# Patient Record
Sex: Female | Born: 1935
Health system: Southern US, Community
[De-identification: ages and names within clinical notes are randomized; demographics above are authoritative.]

## PROBLEM LIST (undated history)

## (undated) DIAGNOSIS — E7800 Pure hypercholesterolemia, unspecified: Secondary | ICD-10-CM

## (undated) DIAGNOSIS — I10 Essential (primary) hypertension: Secondary | ICD-10-CM

## (undated) DIAGNOSIS — R519 Headache, unspecified: Secondary | ICD-10-CM

## (undated) DIAGNOSIS — N189 Chronic kidney disease, unspecified: Secondary | ICD-10-CM

## (undated) DIAGNOSIS — G8929 Other chronic pain: Secondary | ICD-10-CM

## (undated) DIAGNOSIS — R51 Headache: Secondary | ICD-10-CM

## (undated) HISTORY — DX: Other chronic pain: G89.29

## (undated) HISTORY — DX: Essential (primary) hypertension: I10

## (undated) HISTORY — DX: Pure hypercholesterolemia, unspecified: E78.00

## (undated) HISTORY — DX: Headache: R51

## (undated) HISTORY — DX: Headache, unspecified: R51.9

## (undated) HISTORY — DX: Chronic kidney disease, unspecified: N18.9

## (undated) HISTORY — PX: VESICOVAGINAL FISTULA CLOSURE W/ TAH: SUR271

---

## 1997-11-05 ENCOUNTER — Other Ambulatory Visit: Admission: RE | Admit: 1997-11-05 | Discharge: 1997-11-05 | Payer: Self-pay | Admitting: Family Medicine

## 2002-05-26 ENCOUNTER — Ambulatory Visit (HOSPITAL_COMMUNITY): Admission: RE | Admit: 2002-05-26 | Discharge: 2002-05-26 | Payer: Self-pay | Admitting: Gastroenterology

## 2003-08-16 ENCOUNTER — Other Ambulatory Visit: Admission: RE | Admit: 2003-08-16 | Discharge: 2003-08-16 | Payer: Self-pay | Admitting: Obstetrics and Gynecology

## 2011-07-12 DIAGNOSIS — H10409 Unspecified chronic conjunctivitis, unspecified eye: Secondary | ICD-10-CM | POA: Diagnosis not present

## 2011-07-12 DIAGNOSIS — Z961 Presence of intraocular lens: Secondary | ICD-10-CM | POA: Diagnosis not present

## 2011-07-12 DIAGNOSIS — H04129 Dry eye syndrome of unspecified lacrimal gland: Secondary | ICD-10-CM | POA: Diagnosis not present

## 2011-07-12 DIAGNOSIS — H31009 Unspecified chorioretinal scars, unspecified eye: Secondary | ICD-10-CM | POA: Diagnosis not present

## 2011-08-22 DIAGNOSIS — L570 Actinic keratosis: Secondary | ICD-10-CM | POA: Diagnosis not present

## 2011-08-22 DIAGNOSIS — D485 Neoplasm of uncertain behavior of skin: Secondary | ICD-10-CM | POA: Diagnosis not present

## 2011-09-01 DIAGNOSIS — A088 Other specified intestinal infections: Secondary | ICD-10-CM | POA: Diagnosis not present

## 2011-09-04 DIAGNOSIS — A088 Other specified intestinal infections: Secondary | ICD-10-CM | POA: Diagnosis not present

## 2011-11-02 DIAGNOSIS — Z0000 Encounter for general adult medical examination without abnormal findings: Secondary | ICD-10-CM | POA: Diagnosis not present

## 2011-11-02 DIAGNOSIS — E785 Hyperlipidemia, unspecified: Secondary | ICD-10-CM | POA: Diagnosis not present

## 2011-11-02 DIAGNOSIS — M810 Age-related osteoporosis without current pathological fracture: Secondary | ICD-10-CM | POA: Diagnosis not present

## 2011-11-02 DIAGNOSIS — Z79899 Other long term (current) drug therapy: Secondary | ICD-10-CM | POA: Diagnosis not present

## 2011-11-02 DIAGNOSIS — I10 Essential (primary) hypertension: Secondary | ICD-10-CM | POA: Diagnosis not present

## 2011-11-10 DIAGNOSIS — R6889 Other general symptoms and signs: Secondary | ICD-10-CM | POA: Diagnosis not present

## 2011-11-10 DIAGNOSIS — Z1211 Encounter for screening for malignant neoplasm of colon: Secondary | ICD-10-CM | POA: Diagnosis not present

## 2011-11-10 DIAGNOSIS — Z79899 Other long term (current) drug therapy: Secondary | ICD-10-CM | POA: Diagnosis not present

## 2011-12-11 DIAGNOSIS — Z23 Encounter for immunization: Secondary | ICD-10-CM | POA: Diagnosis not present

## 2011-12-11 DIAGNOSIS — N390 Urinary tract infection, site not specified: Secondary | ICD-10-CM | POA: Diagnosis not present

## 2011-12-11 DIAGNOSIS — R350 Frequency of micturition: Secondary | ICD-10-CM | POA: Diagnosis not present

## 2011-12-12 DIAGNOSIS — D649 Anemia, unspecified: Secondary | ICD-10-CM | POA: Diagnosis not present

## 2011-12-22 DIAGNOSIS — K219 Gastro-esophageal reflux disease without esophagitis: Secondary | ICD-10-CM | POA: Diagnosis not present

## 2011-12-22 DIAGNOSIS — R350 Frequency of micturition: Secondary | ICD-10-CM | POA: Diagnosis not present

## 2011-12-22 DIAGNOSIS — N390 Urinary tract infection, site not specified: Secondary | ICD-10-CM | POA: Diagnosis not present

## 2012-01-08 DIAGNOSIS — R3129 Other microscopic hematuria: Secondary | ICD-10-CM | POA: Diagnosis not present

## 2012-02-16 DIAGNOSIS — N390 Urinary tract infection, site not specified: Secondary | ICD-10-CM | POA: Diagnosis not present

## 2012-07-24 DIAGNOSIS — N390 Urinary tract infection, site not specified: Secondary | ICD-10-CM | POA: Diagnosis not present

## 2012-07-24 DIAGNOSIS — R300 Dysuria: Secondary | ICD-10-CM | POA: Diagnosis not present

## 2012-07-24 DIAGNOSIS — I10 Essential (primary) hypertension: Secondary | ICD-10-CM | POA: Diagnosis not present

## 2012-07-24 DIAGNOSIS — M76899 Other specified enthesopathies of unspecified lower limb, excluding foot: Secondary | ICD-10-CM | POA: Diagnosis not present

## 2012-08-02 DIAGNOSIS — I10 Essential (primary) hypertension: Secondary | ICD-10-CM | POA: Diagnosis not present

## 2012-08-02 DIAGNOSIS — N8111 Cystocele, midline: Secondary | ICD-10-CM | POA: Diagnosis not present

## 2012-08-02 DIAGNOSIS — D126 Benign neoplasm of colon, unspecified: Secondary | ICD-10-CM | POA: Diagnosis not present

## 2012-08-28 DIAGNOSIS — Z1231 Encounter for screening mammogram for malignant neoplasm of breast: Secondary | ICD-10-CM | POA: Diagnosis not present

## 2012-08-28 DIAGNOSIS — Z13820 Encounter for screening for osteoporosis: Secondary | ICD-10-CM | POA: Diagnosis not present

## 2012-08-28 DIAGNOSIS — Z780 Asymptomatic menopausal state: Secondary | ICD-10-CM | POA: Diagnosis not present

## 2012-09-17 DIAGNOSIS — K625 Hemorrhage of anus and rectum: Secondary | ICD-10-CM | POA: Diagnosis not present

## 2012-09-17 DIAGNOSIS — Z1211 Encounter for screening for malignant neoplasm of colon: Secondary | ICD-10-CM | POA: Diagnosis not present

## 2012-09-17 DIAGNOSIS — K648 Other hemorrhoids: Secondary | ICD-10-CM | POA: Diagnosis not present

## 2012-09-17 DIAGNOSIS — K5730 Diverticulosis of large intestine without perforation or abscess without bleeding: Secondary | ICD-10-CM | POA: Diagnosis not present

## 2012-10-23 DIAGNOSIS — Z1211 Encounter for screening for malignant neoplasm of colon: Secondary | ICD-10-CM | POA: Diagnosis not present

## 2012-10-23 DIAGNOSIS — K5730 Diverticulosis of large intestine without perforation or abscess without bleeding: Secondary | ICD-10-CM | POA: Diagnosis not present

## 2012-10-23 DIAGNOSIS — D126 Benign neoplasm of colon, unspecified: Secondary | ICD-10-CM | POA: Diagnosis not present

## 2012-10-23 DIAGNOSIS — D128 Benign neoplasm of rectum: Secondary | ICD-10-CM | POA: Diagnosis not present

## 2012-10-23 DIAGNOSIS — K625 Hemorrhage of anus and rectum: Secondary | ICD-10-CM | POA: Diagnosis not present

## 2012-11-11 DIAGNOSIS — D649 Anemia, unspecified: Secondary | ICD-10-CM | POA: Diagnosis not present

## 2012-11-11 DIAGNOSIS — I10 Essential (primary) hypertension: Secondary | ICD-10-CM | POA: Diagnosis not present

## 2012-11-11 DIAGNOSIS — G479 Sleep disorder, unspecified: Secondary | ICD-10-CM | POA: Diagnosis not present

## 2012-11-11 DIAGNOSIS — Z79899 Other long term (current) drug therapy: Secondary | ICD-10-CM | POA: Diagnosis not present

## 2012-11-11 DIAGNOSIS — E785 Hyperlipidemia, unspecified: Secondary | ICD-10-CM | POA: Diagnosis not present

## 2012-11-11 DIAGNOSIS — R42 Dizziness and giddiness: Secondary | ICD-10-CM | POA: Diagnosis not present

## 2012-11-11 DIAGNOSIS — R6889 Other general symptoms and signs: Secondary | ICD-10-CM | POA: Diagnosis not present

## 2012-11-11 DIAGNOSIS — M810 Age-related osteoporosis without current pathological fracture: Secondary | ICD-10-CM | POA: Diagnosis not present

## 2012-11-11 DIAGNOSIS — Z0000 Encounter for general adult medical examination without abnormal findings: Secondary | ICD-10-CM | POA: Diagnosis not present

## 2012-11-21 DIAGNOSIS — J069 Acute upper respiratory infection, unspecified: Secondary | ICD-10-CM | POA: Diagnosis not present

## 2012-11-21 DIAGNOSIS — D649 Anemia, unspecified: Secondary | ICD-10-CM | POA: Diagnosis not present

## 2012-12-01 DIAGNOSIS — N390 Urinary tract infection, site not specified: Secondary | ICD-10-CM | POA: Diagnosis not present

## 2012-12-05 DIAGNOSIS — J069 Acute upper respiratory infection, unspecified: Secondary | ICD-10-CM | POA: Diagnosis not present

## 2012-12-10 ENCOUNTER — Other Ambulatory Visit: Payer: Self-pay | Admitting: Family Medicine

## 2012-12-10 ENCOUNTER — Ambulatory Visit
Admission: RE | Admit: 2012-12-10 | Discharge: 2012-12-10 | Disposition: A | Discharge status: Home / Self Care | Payer: Medicare Other | Source: Ambulatory Visit | Attending: Family Medicine | Admitting: Family Medicine

## 2012-12-10 DIAGNOSIS — R05 Cough: Secondary | ICD-10-CM

## 2012-12-10 DIAGNOSIS — M549 Dorsalgia, unspecified: Secondary | ICD-10-CM

## 2012-12-10 DIAGNOSIS — R059 Cough, unspecified: Secondary | ICD-10-CM

## 2012-12-10 DIAGNOSIS — J8410 Pulmonary fibrosis, unspecified: Secondary | ICD-10-CM | POA: Diagnosis not present

## 2012-12-10 DIAGNOSIS — M47814 Spondylosis without myelopathy or radiculopathy, thoracic region: Secondary | ICD-10-CM | POA: Diagnosis not present

## 2012-12-10 DIAGNOSIS — M4120 Other idiopathic scoliosis, site unspecified: Secondary | ICD-10-CM | POA: Diagnosis not present

## 2012-12-30 DIAGNOSIS — Z23 Encounter for immunization: Secondary | ICD-10-CM | POA: Diagnosis not present

## 2013-02-10 DIAGNOSIS — R05 Cough: Secondary | ICD-10-CM | POA: Diagnosis not present

## 2013-02-10 DIAGNOSIS — B356 Tinea cruris: Secondary | ICD-10-CM | POA: Diagnosis not present

## 2013-03-28 DIAGNOSIS — R05 Cough: Secondary | ICD-10-CM | POA: Diagnosis not present

## 2013-03-28 DIAGNOSIS — R059 Cough, unspecified: Secondary | ICD-10-CM | POA: Diagnosis not present

## 2013-04-15 DIAGNOSIS — R05 Cough: Secondary | ICD-10-CM | POA: Diagnosis not present

## 2013-04-15 DIAGNOSIS — R059 Cough, unspecified: Secondary | ICD-10-CM | POA: Diagnosis not present

## 2013-05-26 ENCOUNTER — Other Ambulatory Visit (HOSPITAL_COMMUNITY): Payer: Self-pay | Admitting: Internal Medicine

## 2013-05-26 ENCOUNTER — Other Ambulatory Visit: Payer: Medicare Other

## 2013-05-26 ENCOUNTER — Ambulatory Visit (INDEPENDENT_AMBULATORY_CARE_PROVIDER_SITE_OTHER): Payer: Medicare Other | Admitting: Internal Medicine

## 2013-05-26 ENCOUNTER — Encounter: Payer: Self-pay | Admitting: Internal Medicine

## 2013-05-26 VITALS — BP 118/74 | HR 56 | Ht 61.0 in | Wt 142.6 lb

## 2013-05-26 DIAGNOSIS — R1310 Dysphagia, unspecified: Secondary | ICD-10-CM

## 2013-05-26 DIAGNOSIS — R05 Cough: Secondary | ICD-10-CM

## 2013-05-26 DIAGNOSIS — R059 Cough, unspecified: Secondary | ICD-10-CM | POA: Diagnosis not present

## 2013-05-26 DIAGNOSIS — R053 Chronic cough: Secondary | ICD-10-CM

## 2013-05-26 MED ORDER — AZELASTINE-FLUTICASONE 137-50 MCG/ACT NA SUSP: 2.0000 | Freq: Every day | NASAL | Status: DC

## 2013-05-26 MED ORDER — FLUTICASONE FUROATE-VILANTEROL 100-25 MCG/INH IN AEPB: 1.0000 | INHALATION_SPRAY | Freq: Every day | RESPIRATORY_TRACT | Status: DC

## 2013-05-26 NOTE — Patient Instructions (Signed)
Order- lab- Quantiferon TB Gold  Order- schedule Speech Therapy assisted Modified Barium swallow                    Dx Cough with swallowing  Sample Breo Ellipta inhaler   Inhale 1 puff, one time daily  Then rinse mouth  Sample Dymista nasal spray     1-2 puffs in each nostril once daily at bedtime  Pay particular attention to swallowing and reflux, with or without heartburn/ indigestion, to see if that seems important to your cough

## 2013-05-26 NOTE — Progress Notes (Signed)
05/26/13- 87 yoF never smoker seen on kind referral from Dr Stephanie Acre for cough Onset of this cough in late September of 2014, starting as a nasal congestion/head cold. Hard coughing her to mid thoracic spine initially but that resolved. Cough persisted but otherwise the viral syndrome resolved. Used Nasonex. Never felt well. After January, cough got worse again although nasal congestion improved. Tried antibiotics twice. Cough has been productive scant yellow with little wheeze and no blood or purulent. Persistent mild rhinitis. Failed Tessalon Perles. Codeine cough syrup helps. Chest x-ray noted elevation left hemidiaphragm, calcified nodes, left ventricular prominence. She had lived in [place] and [place], potentially exposed to histoplasmosis. GERD controlled with Nexium. Eating and drinking occasionally trigger bad cough. Cough is not worse at night. Remembers being very sick as a child with whooping cough. No known exposure to TB but did have a positive PPD in the past. Married, retired Pharmacist, hospital. CXR 12/10/12 IMPRESSION:  1. No active lung disease.  2. Calcified subcarinal and right hilar lymph nodes consistent with  prior granulomatous disease.  Electronically Signed  By: Ivar Drape M.D.  On: 12/10/2012 12:22  Prior to Admission medications   Medication Sig Start Date End Date Taking? Authorizing Provider  aspirin 81 MG tablet Take 81 mg by mouth daily.   Yes Historical Provider, MD  Calcium-Vitamin D-Vitamin K (VIACTIV PO) Take 2 capsules by mouth daily.   Yes Historical Provider, MD  esomeprazole (NEXIUM) 40 MG capsule Take 40 mg by mouth daily at 12 noon.   Yes Historical Provider, MD  fenofibrate micronized (LOFIBRA) 200 MG capsule Take 200 mg by mouth at bedtime.   Yes Historical Provider, MD  ibuprofen (ADVIL,MOTRIN) 200 MG tablet Take 200 mg by mouth as needed.   Yes Historical Provider, MD  losartan (COZAAR) 50 MG tablet Take 50 mg by mouth daily.   Yes Historical  Provider, MD  Azelastine-Fluticasone (DYMISTA) 137-50 MCG/ACT SUSP Place 1-2 sprays into both nostrils at bedtime. 06/13/13   Deneise Lever, MD  Fluticasone Furoate-Vilanterol (BREO ELLIPTA) 100-25 MCG/INH AEPB Inhale 1 puff into the lungs daily. RINSE MOUTH WELL AFTER USE 06/13/13   Deneise Lever, MD   Past Medical History  Diagnosis Date  . High blood pressure   . High cholesterol   . Chronic headaches     migraines   Past Surgical History  Procedure Laterality Date  . Vesicovaginal fistula closure w/ tah     Family History  Problem Relation Age of Onset  . Heart disease Father   . Heart murmur Sister   . Glaucoma Sister   . Allergies Sister    History   Social History  . Marital Status: Married    Spouse Name: N/A    Number of Children: 2  . Years of Education: N/A   Occupational History  . Retired-teacher    Social History Main Topics  . Smoking status: Never Smoker   . Smokeless tobacco: Not on file  . Alcohol Use: Yes     Comment: occasional wine(red)  . Drug Use: No  . Sexual Activity: Not on file   Other Topics Concern  . Not on file   Social History Narrative  . No narrative on file   ROS-see HPI Constitutional:   No-   weight loss, night sweats, fevers, chills, fatigue, lassitude. HEENT:   No-  headaches, difficulty swallowing, tooth/dental problems, sore throat,       +sneezing, itching, ear ache, +nasal congestion, post nasal drip,  CV:  + chest pain, orthopnea, PND, swelling in lower extremities, anasarca,                                  dizziness, palpitations Resp: No-   shortness of breath with exertion or at rest.              + productive cough,  + non-productive cough,  No- coughing up of blood.              No-   change in color of mucus.  No- wheezing.   Skin: No-   rash or lesions. GI:  N+heartburn, indigestion, no-abdominal pain, nausea, vomiting, diarrhea,                 change in bowel habits, loss of appetite GU: No-   dysuria,  change in color of urine, no urgency or frequency.  No- flank pain. MS:  No-   joint pain or swelling.  No- decreased range of motion.  No- back pain. Neuro-     nothing unusual Psych:  No- change in mood or affect. No depression or anxiety.  No memory loss.  OBJ- Physical Exam  Calm elderly lady General- Alert, Oriented, Affect-appropriate, Distress- none acute Skin- rash-none, lesions- none, excoriation- none Lymphadenopathy- none Head- atraumatic            Eyes- Gross vision intact, PERRLA, conjunctivae and secretions clear            Ears- Hearing, canals-normal            Nose- Clear, no-Septal dev, mucus, polyps, erosion, perforation             Throat- Mallampati II , mucosa clear , drainage- none, tonsils- atrophic Neck- flexible , trachea midline, no stridor , thyroid nl, carotid no bruit Chest - symmetrical excursion , unlabored           Heart/CV- RRR , no murmur , no gallop  , no rub, nl s1 s2                           - JVD- none , edema- none, stasis changes- none, varices- none           Lung- clear to P&A, wheeze- none, cough- none , dullness-none, rub- none           Chest wall-  Abd- tender-no, distended-no, bowel sounds-present, HSM- no Br/ Gen/ Rectal- Not done, not indicated Extrem- cyanosis- none, clubbing, none, atrophy- none, strength- nl Neuro- grossly intact to observation

## 2013-05-28 LAB — QUANTIFERON TB GOLD ASSAY (BLOOD)
INTERFERON GAMMA RELEASE ASSAY: NEGATIVE
Mitogen value: 3.64 IU/mL
QUANTIFERON NIL VALUE: 0.04 [IU]/mL
Quantiferon Tb Ag Minus Nil Value: 0.08 IU/mL
TB Ag value: 0.12 IU/mL

## 2013-05-29 DIAGNOSIS — E038 Other specified hypothyroidism: Secondary | ICD-10-CM | POA: Diagnosis not present

## 2013-05-29 DIAGNOSIS — G479 Sleep disorder, unspecified: Secondary | ICD-10-CM | POA: Diagnosis not present

## 2013-05-29 DIAGNOSIS — E785 Hyperlipidemia, unspecified: Secondary | ICD-10-CM | POA: Diagnosis not present

## 2013-05-29 DIAGNOSIS — I10 Essential (primary) hypertension: Secondary | ICD-10-CM | POA: Diagnosis not present

## 2013-06-02 ENCOUNTER — Ambulatory Visit (HOSPITAL_COMMUNITY)
Admission: RE | Admit: 2013-06-02 | Discharge: 2013-06-02 | Disposition: A | Discharge status: Home / Self Care | Payer: Medicare Other | Source: Ambulatory Visit | Attending: Internal Medicine | Admitting: Internal Medicine

## 2013-06-02 DIAGNOSIS — R053 Chronic cough: Secondary | ICD-10-CM

## 2013-06-02 DIAGNOSIS — R1310 Dysphagia, unspecified: Secondary | ICD-10-CM

## 2013-06-02 DIAGNOSIS — R05 Cough: Secondary | ICD-10-CM

## 2013-06-02 DIAGNOSIS — R059 Cough, unspecified: Secondary | ICD-10-CM | POA: Diagnosis not present

## 2013-06-02 NOTE — Procedures (Signed)
Objective Swallowing Evaluation: Modified Barium Swallowing Study  Patient Details  Name: Susan Marks MRN: [id number] Date of Birth: 10/11/1925  Today's Date: 06/02/2013 Time: 6433-2951 SLP Time Calculation (min): 30 min  Past Medical History:  Past Medical History  Diagnosis Date  . High blood pressure   . High cholesterol   . Chronic headaches     migraines   Past Surgical History:  Past Surgical History  Procedure Laterality Date  . Vesicovaginal fistula closure w/ tah     HPI:  80 yo female referred to SLP for MBS due to pt report of chronic coughing.  Pt PMH + for arthritis of neck, acid reflux, HTN.  Pt reports she has taken a PPI of some form over the last 10 years = Generic prilosec for approx 5 years until 4-5 years ago when medicine was changed to Nexium.  Pt reports she avoids tomato based sauces and vinegar based items as they may cause reflux symptoms. Pt admits to chronic coughing since upper respiratory virus in mid-September.  Pt admits to coughing while on phone today and waking with hoarseness.  She reports occasional issue with coughing (usually while dining out) excessively that she has to leave the table- last event occured mid-meal.         Assessment / Plan / Recommendation Clinical Impression  Dysphagia Diagnosis: Within Functional Limits;Suspected primary esophageal dysphagia Clinical impression: Pt with functional oropharyngeal swallow ability without aspiration/penetration or oropharyngeal stasis of any consistency tested *including thin, pudding, graham cracker and barium tablet taken with thin barium.  Pt noted to clear her throat prior to and during MBS.  Appearance of mildly delayed clearance at distal esophagus without pt sensation, liquid and dry swallows aid clearance-radiologist not present to confirm findings.  Pt educated to findings of test and compensation strategies found to be helpful during MBS eg: consuming liquids and intermittent dry  swallow to facilitate clearance.    Thanks for this referral.       Treatment Recommendation  No treatment recommended at this time    Diet Recommendation Regular;Thin liquid   Liquid Administration via: Straw;Cup Medication Administration: Whole meds with liquid Supervision: Patient able to self feed Compensations: Small sips/bites;Slow rate (drink liquids t/o meal, intermittent dry swallow) Postural Changes and/or Swallow Maneuvers: Seated upright 90 degrees;Upright 30-60 min after meal    Other  Recommendations Oral Care Recommendations: Oral care BID   Follow Up Recommendations  None           SLP Swallow Goals     General Date of Onset: 06/02/13 HPI: 80 yo female referred to SLP for MBS due to pt report of chronic coughing.  Pt PMH + for arthritis of neck, acid reflux, HTN.  Pt reports she has taken a PPI of some form over the last 10 years = Generic prilosec for approx 5 years until 4-5 years ago when medicine was changed to Nexium.  Pt reports she avoids tomato based sauces and vinegar based items as they may cause reflux symptoms. Pt admits to chronic coughing since upper respiratory virus in mid-September.  Pt admits to coughing while on phone today and waking with hoarseness.  She reports occasional issue with coughing (usually while dining out) excessively that she has to leave the table- last event occured mid-meal.     Type of Study: Modified Barium Swallowing Study Reason for Referral: Objectively evaluate swallowing function Diet Prior to this Study: Regular;Thin liquids Temperature Spikes Noted: No Respiratory Status: Room air History  of Recent Intubation: No Behavior/Cognition: Alert;Cooperative;Pleasant mood Oral Cavity - Dentition: Adequate natural dentition Oral Motor / Sensory Function: Within functional limits Self-Feeding Abilities: Able to feed self Patient Positioning: Upright in bed Baseline Vocal Quality: Clear Volitional Cough: Strong Volitional  Swallow: Able to elicit Anatomy: Within functional limits Pharyngeal Secretions: Not observed secondary MBS    Reason for Referral Objectively evaluate swallowing function   Oral Phase Oral Preparation/Oral Phase Oral Phase: WFL   Pharyngeal Phase Pharyngeal Phase Pharyngeal Phase: Within functional limits  Cervical Esophageal Phase    GO    Cervical Esophageal Phase Cervical Esophageal Phase: [facility]    Functional Assessment Tool Used: mbs. clinical judgement Functional Limitations: Swallowing Swallow Current Status (W4097): At least 1 percent but less than 20 percent impaired, limited or restricted Swallow Goal Status [phone]): At least 1 percent but less than 20 percent impaired, limited or restricted Swallow Discharge Status [phone]): At least 1 percent but less than 20 percent impaired, limited or restricted    Luanna Salk, [facility] SLP [phone]

## 2013-06-03 DIAGNOSIS — E038 Other specified hypothyroidism: Secondary | ICD-10-CM | POA: Diagnosis not present

## 2013-06-03 DIAGNOSIS — E785 Hyperlipidemia, unspecified: Secondary | ICD-10-CM | POA: Diagnosis not present

## 2013-06-12 ENCOUNTER — Telehealth: Payer: Self-pay | Admitting: Internal Medicine

## 2013-06-12 NOTE — Telephone Encounter (Signed)
Pt was seen by CY on 05/26/13 with the following instructions:   Patient Instructions     Order- lab- Quantiferon TB Gold  Order- schedule Speech Therapy assisted Modified Barium swallow Dx Cough with swallowing  Sample Breo Ellipta inhaler Inhale 1 puff, one time daily Then rinse mouth  Sample Dymista nasal spray 1-2 puffs in each nostril once daily at bedtime  Pay particular attention to swallowing and reflux, with or without heartburn/ indigestion, to see if that seems important to your cough   -------  lmomtcb for pt to verify msg

## 2013-06-13 DIAGNOSIS — R05 Cough: Secondary | ICD-10-CM | POA: Insufficient documentation

## 2013-06-13 DIAGNOSIS — R059 Cough, unspecified: Secondary | ICD-10-CM | POA: Insufficient documentation

## 2013-06-13 MED ORDER — FLUTICASONE FUROATE-VILANTEROL 100-25 MCG/INH IN AEPB: 1.0000 | INHALATION_SPRAY | Freq: Every day | RESPIRATORY_TRACT | Status: DC

## 2013-06-13 MED ORDER — AZELASTINE-FLUTICASONE 137-50 MCG/ACT NA SUSP: 1.0000 | Freq: Every day | NASAL | Status: DC

## 2013-06-13 NOTE — Telephone Encounter (Signed)
Pt has returned call. °

## 2013-06-13 NOTE — Telephone Encounter (Signed)
Spoke with pt.  Pt reports she is coughing less now with breo and dymista.  She would like rxs sent to [facility].  This has been done.  Pt aware and was asked to pls call office back if she has any problems with picking medications up from pharmacy.  She verbalized understanding and voiced no further questions or concerns at this time.

## 2013-06-13 NOTE — Telephone Encounter (Signed)
LMOMTCB x 1 

## 2013-06-13 NOTE — Assessment & Plan Note (Signed)
It sounds as if this cough began with a viral upper respiratory infection. Can't exclude a residual sinusitis or unusually prolonged postviral bronchitis. Possible cyclical cough related to GERD, once the cough process started. I have rarely seen calcified lymph nodes eroded into an airway, causing cough. Plan-TB assay, modified barium swallow for reflux penetration, sample Breo Ellipta, sample Dymista nasal spray. Consider if a CT scan would be helpful.

## 2013-07-07 ENCOUNTER — Encounter: Payer: Self-pay | Admitting: Internal Medicine

## 2013-07-07 ENCOUNTER — Ambulatory Visit (INDEPENDENT_AMBULATORY_CARE_PROVIDER_SITE_OTHER): Payer: Medicare Other | Admitting: Internal Medicine

## 2013-07-07 VITALS — BP 126/70 | HR 64 | Ht 61.0 in | Wt 145.2 lb

## 2013-07-07 DIAGNOSIS — R059 Cough, unspecified: Secondary | ICD-10-CM

## 2013-07-07 DIAGNOSIS — J984 Other disorders of lung: Secondary | ICD-10-CM

## 2013-07-07 DIAGNOSIS — R05 Cough: Secondary | ICD-10-CM

## 2013-07-07 DIAGNOSIS — J8410 Pulmonary fibrosis, unspecified: Secondary | ICD-10-CM | POA: Insufficient documentation

## 2013-07-07 DIAGNOSIS — K219 Gastro-esophageal reflux disease without esophagitis: Secondary | ICD-10-CM

## 2013-07-07 NOTE — Progress Notes (Signed)
05/26/13- 87 yoF never smoker seen on kind referral from Dr Stephanie Acre for cough Onset of this cough in late September of 2014, starting as a nasal congestion/head cold. Hard coughing her to mid thoracic spine initially but that resolved. Cough persisted but otherwise the viral syndrome resolved. Used Nasonex. Never felt well. After January, cough got worse again although nasal congestion improved. Tried antibiotics twice. Cough has been productive scant yellow with little wheeze and no blood or purulent. Persistent mild rhinitis. Failed Tessalon Perles. Codeine cough syrup helps. Chest x-ray noted elevation left hemidiaphragm, calcified nodes, left ventricular prominence. She had lived in [place] and [place], potentially exposed to histoplasmosis. GERD controlled with Nexium. Eating and drinking occasionally trigger bad cough. Cough is not worse at night. Remembers being very sick as a child with whooping cough. No known exposure to TB but did have a positive PPD in the past. Married, retired Pharmacist, hospital. CXR 12/10/12 IMPRESSION:  1. No active lung disease.  2. Calcified subcarinal and right hilar lymph nodes consistent with  prior granulomatous disease.  Electronically Signed  By: Ivar Drape M.D.  On: 12/10/2012 12:22  07/07/13- 82 yoF never smoker seen on kind referral from Dr Stephanie Acre for cough, complicated by GERD, old granulomatous disease FOLLOWS FOR: Pt states that she continues to cough but not as much(had a few times of bad cough when eating); would like to discuss inhaler and nasal spray with CY.  Given Breo and Dymista to try last visit. She emphasizes she is really much better, and we discussed how to taper off meds and watch, then resume when needed. Swallowing eval 06/05/13- "suspected" mild esophageal dysphagia, but not demonstrated. She was educated on reflux and penetration precautions. Continues Nexium. Today admits she does sometime notice a little reflux. Quant TB Gold-  Neg 05/26/13  ROS-see HPI Constitutional:   No-   weight loss, night sweats, fevers, chills, fatigue, lassitude. HEENT:   No-  headaches, difficulty swallowing, tooth/dental problems, sore throat,       No-sneezing, itching, ear ache, +nasal congestion, post nasal drip,  CV:  No-chest pain, orthopnea, PND, swelling in lower extremities, anasarca,                                  dizziness, palpitations Resp: No-   shortness of breath with exertion or at rest.              No- productive cough,  + non-productive cough,  No- coughing up of blood.              No-   change in color of mucus.  No- wheezing.   Skin: No-   rash or lesions. GI:  No-heartburn, indigestion, no-abdominal pain, nausea, vomiting,  GU: . MS:  No-   joint pain or swelling.  . Neuro-     nothing unusual Psych:  No- change in mood or affect. No depression or anxiety.  No memory loss.  OBJ- Physical Exam  Calm elderly lady General- Alert, Oriented, Affect-appropriate, Distress- none acute Skin- rash-none, lesions- none, excoriation- none Lymphadenopathy- none Head- atraumatic            Eyes- Gross vision intact, PERRLA, conjunctivae and secretions clear            Ears- Hearing, canals-normal            Nose- Clear, no-Septal dev, mucus, polyps, erosion, perforation  Throat- Mallampati III , mucosa clear , drainage- none, tonsils- atrophic. +Voice a bit raspy                     w throat clearing. No visible thrush. Neck- flexible , trachea midline, no stridor , thyroid nl, carotid no bruit Chest - symmetrical excursion , unlabored           Heart/CV- RRR , no murmur , no gallop  , no rub, nl s1 s2                           - JVD- none , edema- none, stasis changes- none, varices- none           Lung- clear to P&A, wheeze- none, cough- none , dullness-none, rub- none           Chest wall-  Abd-  Br/ Gen/ Rectal- Not done, not indicated Extrem- cyanosis- none, clubbing, none, atrophy- none, strength-  nl Neuro- grossly intact to observation

## 2013-07-07 NOTE — Assessment & Plan Note (Signed)
Old calcified granulomas without active disease. These can occasionally erode into an airway to cause hemoptysis and cough.

## 2013-07-07 NOTE — Patient Instructions (Signed)
We can continue the Dymista nasal spray 1-2 puffs each nostril once daily at bedtime  We can continue Breo Ellipta 1 puff, then rinse, once daily  Try to tuck your chin just a little as you swallow, to minimize choking when you eat.  Consider elevating the head of your bedframe with a brick under each head leg of the bed. This will reduce reflux during the night.

## 2013-07-07 NOTE — Assessment & Plan Note (Signed)
Symptoms mostly controlled w Nexium. Suspect occasional mild reflux or LPR contributing to cough Plan- reinforced swallowing and reflux precaution techniques taught by Speech Therapist at swallowing eval.

## 2013-09-02 DIAGNOSIS — N390 Urinary tract infection, site not specified: Secondary | ICD-10-CM | POA: Diagnosis not present

## 2013-09-23 DIAGNOSIS — R040 Epistaxis: Secondary | ICD-10-CM | POA: Diagnosis not present

## 2013-09-29 DIAGNOSIS — J3489 Other specified disorders of nose and nasal sinuses: Secondary | ICD-10-CM | POA: Diagnosis not present

## 2013-10-09 DIAGNOSIS — M4310 Spondylolisthesis, site unspecified: Secondary | ICD-10-CM | POA: Diagnosis not present

## 2013-11-06 ENCOUNTER — Ambulatory Visit: Payer: Medicare Other | Admitting: Internal Medicine

## 2013-11-11 DIAGNOSIS — E038 Other specified hypothyroidism: Secondary | ICD-10-CM | POA: Diagnosis not present

## 2013-11-11 DIAGNOSIS — M810 Age-related osteoporosis without current pathological fracture: Secondary | ICD-10-CM | POA: Diagnosis not present

## 2013-11-11 DIAGNOSIS — Z79899 Other long term (current) drug therapy: Secondary | ICD-10-CM | POA: Diagnosis not present

## 2013-11-11 DIAGNOSIS — E785 Hyperlipidemia, unspecified: Secondary | ICD-10-CM | POA: Diagnosis not present

## 2013-11-11 DIAGNOSIS — D649 Anemia, unspecified: Secondary | ICD-10-CM | POA: Diagnosis not present

## 2013-11-17 DIAGNOSIS — E785 Hyperlipidemia, unspecified: Secondary | ICD-10-CM | POA: Diagnosis not present

## 2013-11-17 DIAGNOSIS — M810 Age-related osteoporosis without current pathological fracture: Secondary | ICD-10-CM | POA: Diagnosis not present

## 2013-11-17 DIAGNOSIS — R109 Unspecified abdominal pain: Secondary | ICD-10-CM | POA: Diagnosis not present

## 2013-11-17 DIAGNOSIS — E038 Other specified hypothyroidism: Secondary | ICD-10-CM | POA: Diagnosis not present

## 2013-11-17 DIAGNOSIS — I447 Left bundle-branch block, unspecified: Secondary | ICD-10-CM | POA: Diagnosis not present

## 2013-11-17 DIAGNOSIS — N183 Chronic kidney disease, stage 3 unspecified: Secondary | ICD-10-CM | POA: Diagnosis not present

## 2013-11-17 DIAGNOSIS — N390 Urinary tract infection, site not specified: Secondary | ICD-10-CM | POA: Diagnosis not present

## 2013-11-17 DIAGNOSIS — K219 Gastro-esophageal reflux disease without esophagitis: Secondary | ICD-10-CM | POA: Diagnosis not present

## 2013-11-17 DIAGNOSIS — Z23 Encounter for immunization: Secondary | ICD-10-CM | POA: Diagnosis not present

## 2013-11-17 DIAGNOSIS — Z0000 Encounter for general adult medical examination without abnormal findings: Secondary | ICD-10-CM | POA: Diagnosis not present

## 2013-12-01 ENCOUNTER — Ambulatory Visit: Payer: Medicare Other

## 2013-12-09 ENCOUNTER — Ambulatory Visit: Payer: Medicare Other | Attending: Family Medicine

## 2013-12-09 DIAGNOSIS — M545 Low back pain, unspecified: Secondary | ICD-10-CM | POA: Insufficient documentation

## 2013-12-09 DIAGNOSIS — R269 Unspecified abnormalities of gait and mobility: Secondary | ICD-10-CM | POA: Insufficient documentation

## 2013-12-09 DIAGNOSIS — R5381 Other malaise: Secondary | ICD-10-CM | POA: Diagnosis not present

## 2013-12-09 DIAGNOSIS — IMO0001 Reserved for inherently not codable concepts without codable children: Secondary | ICD-10-CM | POA: Diagnosis not present

## 2013-12-11 ENCOUNTER — Ambulatory Visit: Payer: Medicare Other

## 2013-12-11 DIAGNOSIS — M545 Low back pain, unspecified: Secondary | ICD-10-CM | POA: Diagnosis not present

## 2013-12-11 DIAGNOSIS — R5381 Other malaise: Secondary | ICD-10-CM | POA: Diagnosis not present

## 2013-12-11 DIAGNOSIS — IMO0001 Reserved for inherently not codable concepts without codable children: Secondary | ICD-10-CM | POA: Diagnosis not present

## 2013-12-11 DIAGNOSIS — R269 Unspecified abnormalities of gait and mobility: Secondary | ICD-10-CM | POA: Diagnosis not present

## 2013-12-16 ENCOUNTER — Ambulatory Visit: Payer: Medicare Other

## 2013-12-16 DIAGNOSIS — R269 Unspecified abnormalities of gait and mobility: Secondary | ICD-10-CM | POA: Diagnosis not present

## 2013-12-16 DIAGNOSIS — M545 Low back pain, unspecified: Secondary | ICD-10-CM | POA: Diagnosis not present

## 2013-12-16 DIAGNOSIS — IMO0001 Reserved for inherently not codable concepts without codable children: Secondary | ICD-10-CM | POA: Diagnosis not present

## 2013-12-16 DIAGNOSIS — R5381 Other malaise: Secondary | ICD-10-CM | POA: Diagnosis not present

## 2013-12-18 ENCOUNTER — Encounter: Payer: Medicare Other | Admitting: Physical Therapy

## 2013-12-19 ENCOUNTER — Ambulatory Visit: Payer: Medicare Other | Attending: Family Medicine | Admitting: Physical Therapy

## 2013-12-19 DIAGNOSIS — M545 Low back pain: Secondary | ICD-10-CM | POA: Insufficient documentation

## 2013-12-19 DIAGNOSIS — Z5189 Encounter for other specified aftercare: Secondary | ICD-10-CM | POA: Diagnosis not present

## 2013-12-19 DIAGNOSIS — R269 Unspecified abnormalities of gait and mobility: Secondary | ICD-10-CM | POA: Insufficient documentation

## 2013-12-19 DIAGNOSIS — R5381 Other malaise: Secondary | ICD-10-CM | POA: Insufficient documentation

## 2013-12-19 DIAGNOSIS — Z23 Encounter for immunization: Secondary | ICD-10-CM | POA: Diagnosis not present

## 2013-12-23 ENCOUNTER — Ambulatory Visit: Payer: Medicare Other

## 2013-12-23 DIAGNOSIS — Z5189 Encounter for other specified aftercare: Secondary | ICD-10-CM | POA: Diagnosis not present

## 2013-12-25 ENCOUNTER — Ambulatory Visit: Payer: Medicare Other

## 2013-12-25 DIAGNOSIS — Z5189 Encounter for other specified aftercare: Secondary | ICD-10-CM | POA: Diagnosis not present

## 2013-12-30 ENCOUNTER — Ambulatory Visit: Payer: Medicare Other

## 2013-12-30 DIAGNOSIS — Z5189 Encounter for other specified aftercare: Secondary | ICD-10-CM | POA: Diagnosis not present

## 2014-01-01 ENCOUNTER — Ambulatory Visit: Payer: Medicare Other

## 2014-01-01 DIAGNOSIS — Z5189 Encounter for other specified aftercare: Secondary | ICD-10-CM | POA: Diagnosis not present

## 2014-01-06 ENCOUNTER — Ambulatory Visit: Payer: Medicare Other | Admitting: Physical Therapy

## 2014-01-06 DIAGNOSIS — Z5189 Encounter for other specified aftercare: Secondary | ICD-10-CM | POA: Diagnosis not present

## 2014-01-08 ENCOUNTER — Ambulatory Visit: Payer: Medicare Other | Admitting: Physical Therapy

## 2014-01-08 DIAGNOSIS — Z5189 Encounter for other specified aftercare: Secondary | ICD-10-CM | POA: Diagnosis not present

## 2014-01-13 ENCOUNTER — Ambulatory Visit: Payer: Medicare Other | Admitting: Physical Therapy

## 2014-01-13 DIAGNOSIS — Z5189 Encounter for other specified aftercare: Secondary | ICD-10-CM | POA: Diagnosis not present

## 2014-01-15 ENCOUNTER — Ambulatory Visit: Payer: Medicare Other

## 2014-01-15 DIAGNOSIS — Z5189 Encounter for other specified aftercare: Secondary | ICD-10-CM | POA: Diagnosis not present

## 2014-01-20 ENCOUNTER — Ambulatory Visit: Payer: Medicare Other | Attending: Family Medicine | Admitting: Physical Therapy

## 2014-01-20 DIAGNOSIS — M545 Low back pain: Secondary | ICD-10-CM | POA: Diagnosis not present

## 2014-01-20 DIAGNOSIS — Z5189 Encounter for other specified aftercare: Secondary | ICD-10-CM | POA: Diagnosis not present

## 2014-01-20 DIAGNOSIS — R269 Unspecified abnormalities of gait and mobility: Secondary | ICD-10-CM | POA: Diagnosis not present

## 2014-01-20 DIAGNOSIS — R5381 Other malaise: Secondary | ICD-10-CM | POA: Diagnosis not present

## 2014-01-22 ENCOUNTER — Ambulatory Visit: Payer: Medicare Other

## 2014-01-27 ENCOUNTER — Ambulatory Visit: Payer: Medicare Other

## 2014-01-27 DIAGNOSIS — Z5189 Encounter for other specified aftercare: Secondary | ICD-10-CM | POA: Diagnosis not present

## 2014-01-29 ENCOUNTER — Ambulatory Visit: Payer: Medicare Other | Admitting: Physical Therapy

## 2014-01-30 ENCOUNTER — Ambulatory Visit: Payer: Medicare Other

## 2014-01-30 DIAGNOSIS — Z5189 Encounter for other specified aftercare: Secondary | ICD-10-CM | POA: Diagnosis not present

## 2014-02-03 ENCOUNTER — Ambulatory Visit: Payer: Medicare Other | Admitting: Physical Therapy

## 2014-02-03 ENCOUNTER — Ambulatory Visit: Payer: Medicare Other

## 2014-02-05 ENCOUNTER — Ambulatory Visit: Payer: Medicare Other

## 2014-02-05 DIAGNOSIS — Z5189 Encounter for other specified aftercare: Secondary | ICD-10-CM | POA: Diagnosis not present

## 2014-02-06 ENCOUNTER — Encounter: Payer: Medicare Other | Admitting: Physical Therapy

## 2014-02-10 ENCOUNTER — Ambulatory Visit: Payer: Medicare Other | Admitting: Physical Therapy

## 2014-02-10 DIAGNOSIS — Z5189 Encounter for other specified aftercare: Secondary | ICD-10-CM | POA: Diagnosis not present

## 2014-02-17 ENCOUNTER — Ambulatory Visit: Payer: Medicare Other | Attending: Family Medicine

## 2014-02-17 DIAGNOSIS — Z5189 Encounter for other specified aftercare: Secondary | ICD-10-CM | POA: Insufficient documentation

## 2014-02-17 DIAGNOSIS — R5381 Other malaise: Secondary | ICD-10-CM | POA: Diagnosis not present

## 2014-02-17 DIAGNOSIS — M545 Low back pain: Secondary | ICD-10-CM | POA: Diagnosis not present

## 2014-02-17 DIAGNOSIS — R269 Unspecified abnormalities of gait and mobility: Secondary | ICD-10-CM | POA: Insufficient documentation

## 2014-02-19 ENCOUNTER — Ambulatory Visit: Payer: Medicare Other

## 2014-02-19 DIAGNOSIS — Z5189 Encounter for other specified aftercare: Secondary | ICD-10-CM | POA: Diagnosis not present

## 2014-02-20 ENCOUNTER — Encounter: Payer: Medicare Other | Admitting: Physical Therapy

## 2014-02-24 ENCOUNTER — Ambulatory Visit: Payer: Medicare Other | Admitting: Physical Therapy

## 2014-02-24 DIAGNOSIS — Z5189 Encounter for other specified aftercare: Secondary | ICD-10-CM | POA: Diagnosis not present

## 2014-02-26 ENCOUNTER — Ambulatory Visit: Payer: Medicare Other | Admitting: Physical Therapy

## 2014-02-26 DIAGNOSIS — I493 Ventricular premature depolarization: Secondary | ICD-10-CM | POA: Diagnosis not present

## 2014-02-26 DIAGNOSIS — R079 Chest pain, unspecified: Secondary | ICD-10-CM | POA: Diagnosis not present

## 2014-02-26 DIAGNOSIS — Z5189 Encounter for other specified aftercare: Secondary | ICD-10-CM | POA: Diagnosis not present

## 2014-02-26 DIAGNOSIS — I10 Essential (primary) hypertension: Secondary | ICD-10-CM | POA: Diagnosis not present

## 2014-03-03 ENCOUNTER — Ambulatory Visit: Payer: Medicare Other | Admitting: Physical Therapy

## 2014-03-03 DIAGNOSIS — Z5189 Encounter for other specified aftercare: Secondary | ICD-10-CM | POA: Diagnosis not present

## 2014-03-05 ENCOUNTER — Ambulatory Visit: Payer: Medicare Other

## 2014-03-05 DIAGNOSIS — Z5189 Encounter for other specified aftercare: Secondary | ICD-10-CM | POA: Diagnosis not present

## 2014-03-10 ENCOUNTER — Encounter: Payer: Self-pay | Admitting: Cardiology

## 2014-03-10 ENCOUNTER — Ambulatory Visit: Payer: Medicare Other

## 2014-03-10 ENCOUNTER — Ambulatory Visit (INDEPENDENT_AMBULATORY_CARE_PROVIDER_SITE_OTHER): Payer: Medicare Other | Admitting: Cardiology

## 2014-03-10 VITALS — BP 160/82 | HR 58 | Ht 60.0 in | Wt 142.4 lb

## 2014-03-10 DIAGNOSIS — I10 Essential (primary) hypertension: Secondary | ICD-10-CM | POA: Insufficient documentation

## 2014-03-10 DIAGNOSIS — R9431 Abnormal electrocardiogram [ECG] [EKG]: Secondary | ICD-10-CM | POA: Insufficient documentation

## 2014-03-10 DIAGNOSIS — R079 Chest pain, unspecified: Secondary | ICD-10-CM | POA: Diagnosis not present

## 2014-03-10 LAB — CARDIAC PANEL
CK MB: 3.6 ng/mL (ref 0.3–4.0)
CK TOTAL: 142 U/L (ref 7–177)
Relative Index: 2.5 calc (ref 0.0–2.5)

## 2014-03-10 MED ORDER — LOSARTAN POTASSIUM 100 MG PO TABS: 100.0000 mg | ORAL_TABLET | Freq: Every day | ORAL | Status: DC

## 2014-03-10 NOTE — Patient Instructions (Addendum)
Your physician has recommended you make the following change in your medication:  1) Take 100 mg Cozaar daily (at once)  Your physician recommends that you return for lab work TODAY ([place])   Your physician has requested that you have a [place]. For further information please visit [email or web address]. Please follow instruction sheet, as given.  Your physician has requested that you have an echocardiogram. Echocardiography is a painless test that uses sound waves to create images of your heart. It provides your doctor with information about the size and shape of your heart and how well your heart's chambers and valves are working. This procedure takes approximately one hour. There are no restrictions for this procedure.  Your physician recommends that you schedule a follow-up appointment AS NEEDED with Dr. Radford Pax after your tests.

## 2014-03-10 NOTE — Progress Notes (Signed)
[place], [place], [place]  [address] Phone: [phone] Fax:  [phone]  Date:  03/10/2014   ID:  Susan Marks, DOB Apr 06, 1925, MRN [id number]  PCP:  Susan Coma, MD  Cardiologist:  Susan Him, MD    History of Present Illness: Susan Marks is a 81 y.o. female with a history of HTN, CKD stage III, anemia  and dyslipidemia who presents today for evaluation of chest pain.  She was seen by her PCP 2 weeks ago for chest pain that she developed across her chest while walking out of a band concert.  There was no radiation of the pain and denied any nausea, diaphoresis or SOB.  She has had this pain for about 3 years but initially only occurred with extreme exertion walking the dog and was very brief but over the past year it occurs when she is hurrying.   This episode recently lasted longer.  She denies any DOE.  There was no radiation of the pain, SOB or nausea.  She occasionally notices her heart beats faster when lying in bed.  EKG showed NSR with LBBB and PVC's   Wt Readings from Last 3 Encounters:  03/10/14 142 lb 6.4 oz (64.592 kg)  07/07/13 145 lb 3.2 oz (65.862 kg)  05/26/13 142 lb 9.6 oz (64.683 kg)     Past Medical History  Diagnosis Date  . High blood pressure   . High cholesterol   . Chronic headaches     migraines    Current Outpatient Prescriptions  Medication Sig Dispense Refill  . aspirin 81 MG tablet Take 81 mg by mouth daily.    . Azelastine-Fluticasone (DYMISTA) 137-50 MCG/ACT SUSP Place 1-2 sprays into both nostrils at bedtime. 1 Bottle 6  . Calcium-Vitamin D-Vitamin K (VIACTIV PO) Take 2 capsules by mouth daily.    . diazepam (VALIUM) 2 MG tablet Take 1 mg by mouth as needed for anxiety.    Marland Kitchen esomeprazole (NEXIUM) 40 MG capsule Take 40 mg by mouth daily.     . fenofibrate micronized (LOFIBRA) 200 MG capsule Take 200 mg by mouth at bedtime.    . Fluticasone Furoate-Vilanterol (BREO ELLIPTA) 100-25 MCG/INH AEPB Inhale 1 puff into  the lungs daily. RINSE MOUTH WELL AFTER USE 1 each 6  . ibuprofen (ADVIL,MOTRIN) 200 MG tablet Take 200 mg by mouth as needed.    Marland Kitchen losartan (COZAAR) 50 MG tablet Take 50 mg by mouth daily.     No current facility-administered medications for this visit.    Allergies:    Allergies  Allergen Reactions  . Sulfa Antibiotics Other (See Comments)    REACTION: swollen under eyes and cheek  . Lisinopril     cough  . Statins     Leg cramps    Social History:  The patient  reports that she has never smoked. She does not have any smokeless tobacco history on file. She reports that she drinks alcohol. She reports that she does not use illicit drugs.   Family History:  The patient's family history includes Allergies in her sister; Glaucoma in her sister; Heart disease in her father; Heart murmur in her sister.   ROS:  Please see the history of present illness.      All other systems reviewed and negative.   PHYSICAL EXAM: VS:  BP 160/82 mmHg  Pulse 58  Ht 5' (1.524 m)  Wt 142 lb 6.4 oz (64.592 kg)  BMI 27.81 kg/m2 Well nourished, well  developed, in no acute distress HEENT: normal Neck: no JVD Cardiac:  normal S1, S2; RRR; no murmur Lungs:  clear to auscultation bilaterally, no wheezing, rhonchi or rales Abd: soft, nontender, no hepatomegaly Ext: no edema Skin: warm and dry Neuro:  CNs 2-12 intact, no focal abnormalities noted  EKG:  Sinus bradycardia with LVH with QRS widening, LAFB, inferior infarct, anterior infarct and PVC's     ASSESSMENT AND PLAN:  1. Chest pain which is concerning for ischemia.  Her symptoms have been occurring for 3 years but are becoming more frequent and lasting longer. SHe has some mild discomfort now but EKG is unchanged.  I will send off a Troponin and CPK MB and I will get a Lexiscan myoview to rule out ischemia.  She cannot walk on a treadmill.  I will also get a 2D echo to assess LVF.  2. Abnormal EKG with anterior and inferior infarct with LAFB, QRS  widening with LVH ? Duration - I have no other EKGs for comparison but she thinks it was abnormal about 5 years ago when she say Dr. Marlou Porch.  3. HTN borderline controlled.  Continue Cozaar.  She has been splitting the dose 50mg  BID recently but I instructed her to take 100mg  daily.   4. Dyslipidemia - continue fenofibrate 5. CKD stage III  Followup PRN pending results of studies  Signed, Susan Him, MD [facility] 03/10/2014 10:58 AM

## 2014-03-11 ENCOUNTER — Ambulatory Visit: Payer: Medicare Other

## 2014-03-11 ENCOUNTER — Ambulatory Visit (HOSPITAL_COMMUNITY): Payer: Medicare Other | Attending: Cardiology

## 2014-03-11 DIAGNOSIS — K219 Gastro-esophageal reflux disease without esophagitis: Secondary | ICD-10-CM | POA: Diagnosis not present

## 2014-03-11 DIAGNOSIS — I080 Rheumatic disorders of both mitral and aortic valves: Secondary | ICD-10-CM | POA: Insufficient documentation

## 2014-03-11 DIAGNOSIS — R9431 Abnormal electrocardiogram [ECG] [EKG]: Secondary | ICD-10-CM | POA: Insufficient documentation

## 2014-03-11 DIAGNOSIS — I10 Essential (primary) hypertension: Secondary | ICD-10-CM | POA: Diagnosis not present

## 2014-03-11 DIAGNOSIS — Z5189 Encounter for other specified aftercare: Secondary | ICD-10-CM | POA: Diagnosis not present

## 2014-03-11 DIAGNOSIS — R05 Cough: Secondary | ICD-10-CM | POA: Insufficient documentation

## 2014-03-11 DIAGNOSIS — R079 Chest pain, unspecified: Secondary | ICD-10-CM

## 2014-03-11 NOTE — Progress Notes (Signed)
2D Echo completed. 03/11/2014 

## 2014-03-12 ENCOUNTER — Encounter: Payer: Medicare Other | Admitting: Physical Therapy

## 2014-03-18 ENCOUNTER — Ambulatory Visit (HOSPITAL_COMMUNITY): Payer: Medicare Other | Attending: Cardiology | Admitting: Radiology

## 2014-03-18 DIAGNOSIS — R9431 Abnormal electrocardiogram [ECG] [EKG]: Secondary | ICD-10-CM | POA: Insufficient documentation

## 2014-03-18 DIAGNOSIS — I10 Essential (primary) hypertension: Secondary | ICD-10-CM | POA: Insufficient documentation

## 2014-03-18 DIAGNOSIS — R0602 Shortness of breath: Secondary | ICD-10-CM | POA: Diagnosis not present

## 2014-03-18 DIAGNOSIS — R079 Chest pain, unspecified: Secondary | ICD-10-CM | POA: Insufficient documentation

## 2014-03-18 DIAGNOSIS — I447 Left bundle-branch block, unspecified: Secondary | ICD-10-CM | POA: Insufficient documentation

## 2014-03-18 MED ORDER — TECHNETIUM TC 99M SESTAMIBI GENERIC - CARDIOLITE
11.0000 | Freq: Once | INTRAVENOUS | Status: AC | PRN
  Administered 2014-03-18: 11 via INTRAVENOUS

## 2014-03-18 MED ORDER — ADENOSINE (DIAGNOSTIC) 3 MG/ML IV SOLN
0.5600 mg/kg | Freq: Once | INTRAVENOUS | Status: AC
  Administered 2014-03-18: 35.7 mg via INTRAVENOUS

## 2014-03-18 MED ORDER — TECHNETIUM TC 99M SESTAMIBI GENERIC - CARDIOLITE
33.0000 | Freq: Once | INTRAVENOUS | Status: AC | PRN
  Administered 2014-03-18: 33 via INTRAVENOUS

## 2014-03-18 NOTE — Progress Notes (Signed)
[place] [facility]UCLE[facility] [address] [place],  [address] [phone]    Cardiology Nuclear Med Study  CELINES FEMIA is a 80 y.o. female     MRN : [id number]     DOB: [birth year]  Procedure Date: 03/18/2014  Nuclear Med Background Indication for Stress Test:  Evaluation for Ischemia and Abnormal EKG History:  MPI ~ 5 years ago Cardiac Risk Factors: Hypertension and LBBB  Symptoms:  Chest Pain (last date of chest discomfort was three weeks ago) and SOB   Nuclear Pre-Procedure Caffeine/Decaff Intake:  None> 12 hrs NPO After: 7:00pm   Lungs:  clear O2 Sat: 99% on room air. IV 0.9% NS with Angio Cath:  22g  IV Site: R Antecubital x 1, tolerated well IV Started by:  Irven Baltimore, RN  Chest Size (in):  40 Cup Size: DD  Height: 5\' 1"  (1.549 m)  Weight:  141 lb (63.957 kg)  BMI:  Body mass index is 26.66 kg/(m^2). Tech Comments:  Patient took Cozaar this am. Irven Baltimore, RN.    Nuclear Med Study 1 or 2 day study: 1 day  Stress Test Type:  Adenosine  Reading MD: N/A  Order Authorizing Provider:  Fransico Him, MD  Resting Radionuclide: Technetium 27m Sestamibi  Resting Radionuclide Dose: 11.0 mCi   Stress Radionuclide:  Technetium 40m Sestamibi  Stress Radionuclide Dose: 33.0 mCi           Stress Protocol Rest HR: 54 Stress HR: 64  Rest BP: 198/89 Stress BP: 93/61  Exercise Time (min): n/a METS: n/a   Predicted Max HR: 134 bpm % Max HR: 47.76 bpm Rate Pressure Product: 8320   Dose of Adenosine (mg):  35.9 mg Dose of Lexiscan: n/a mg  Dose of Atropine (mg): n/a Dose of Dobutamine: n/a mcg/kg/min (at max HR)  Stress Test Technologist: Glade Lloyd, BS-ES  Nuclear Technologist:  Annye Rusk, CNMT     Rest Procedure:  Myocardial perfusion imaging was performed at rest 45 minutes following the intravenous administration of Technetium 52m Sestamibi. Rest ECG: NSR-LBBB  Stress Procedure:  The patient received IV adenosine at 140 mcg/kg/min for 4  minutes.  Technetium 21m Sestamibi was injected at the 2 minute mark and quantitative spect images were obtained after a 45 minute delay.  During the infusion of Adenosine the patient complained of chest pressure, feeling flushed and fingers tingling.  She did have heart block during the infusion but patient coughed and resumed NSR.  These symptoms resolved in recovery.  Stress ECG: No significant change from baseline ECG  QPS Raw Data Images:  Normal; no motion artifact; normal heart/lung ratio. Stress Images:  Normal homogeneous uptake in all areas of the myocardium. Rest Images:  Normal homogeneous uptake in all areas of the myocardium. Subtraction (SDS):  No evidence of ischemia. Transient Ischemic Dilatation (Normal <1.22):  0.94 Lung/Heart Ratio (Normal <0.45):  0.32  Quantitative Gated Spect Images QGS EDV:  n/a QGS ESV:  n/a  Impression Exercise Capacity:  Adenosine study with no exercise. BP Response:  Normal blood pressure response. Clinical Symptoms:  Mild chest pain/dyspnea. ECG Impression:  Baseline:  LBBB.  EKG uninterpretable due to LBBB at rest and stress. Comparison with Prior Nuclear Study: No previous nuclear study performed  Overall Impression:  Low risk stress nuclear study.  LBBB at rest and with stress. No ischemia. Study not gated because of PVCs.  LV Ejection Fraction: Study not gated.  LV Wall Motion:  Study not gated .  Darlin Coco MD

## 2014-03-19 ENCOUNTER — Encounter: Payer: Self-pay | Admitting: Cardiology

## 2014-03-19 NOTE — Telephone Encounter (Signed)
This encounter was created in error - please disregard.

## 2014-03-19 NOTE — Telephone Encounter (Signed)
New message    Patient was told to call back speak with nurse.

## 2014-04-17 ENCOUNTER — Ambulatory Visit: Payer: Medicare Other | Admitting: Cardiology

## 2014-09-15 ENCOUNTER — Other Ambulatory Visit: Payer: Self-pay | Admitting: Cardiology

## 2014-11-04 IMAGING — CR DG CHEST 2V
2 series · 2 of 2 positions shown · non-contrast
Comparison: None.

CLINICAL DATA: Cough, midline chest pain, left-sided thoracic pain

EXAM:
CHEST  2 VIEW

[w chest pa]
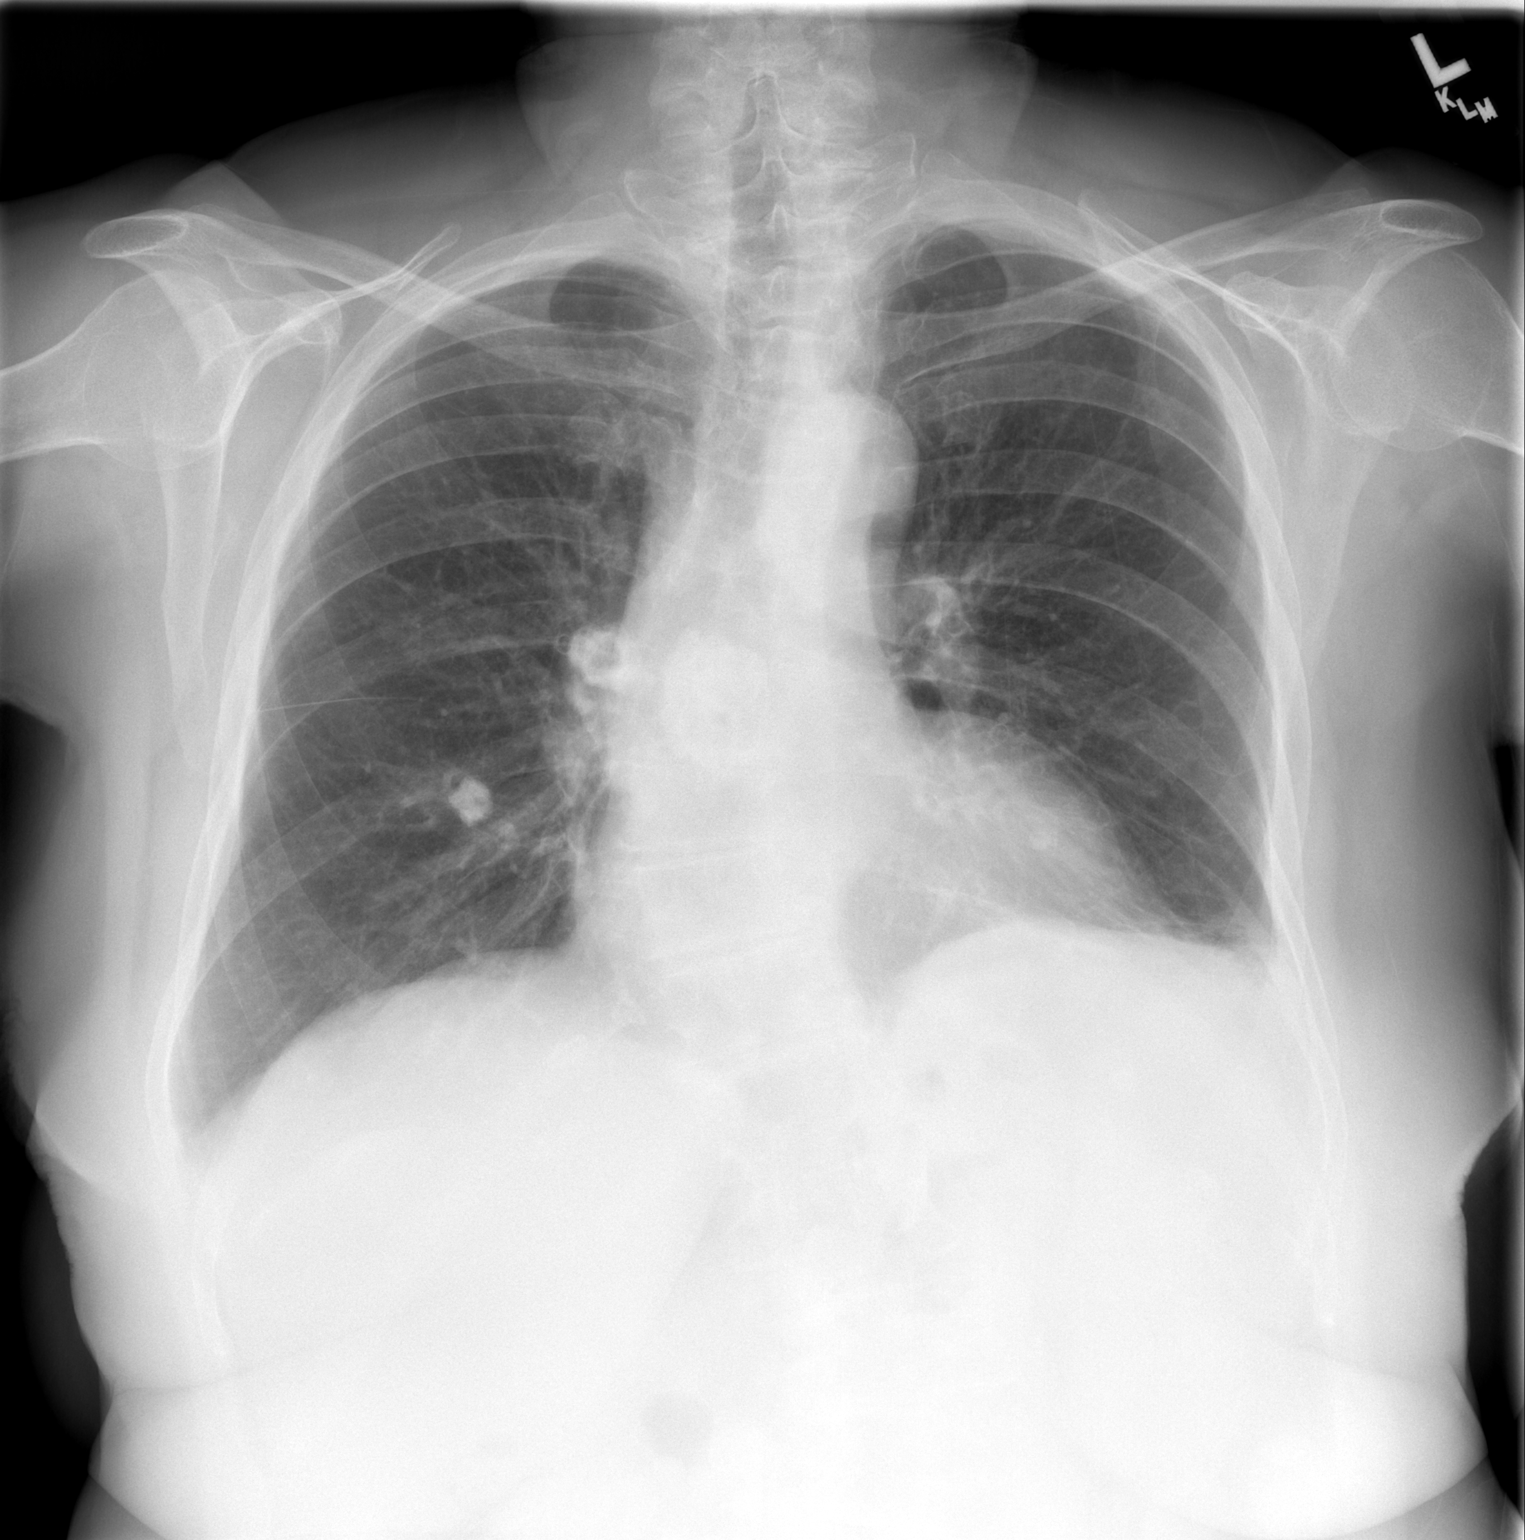

[w chest lat]
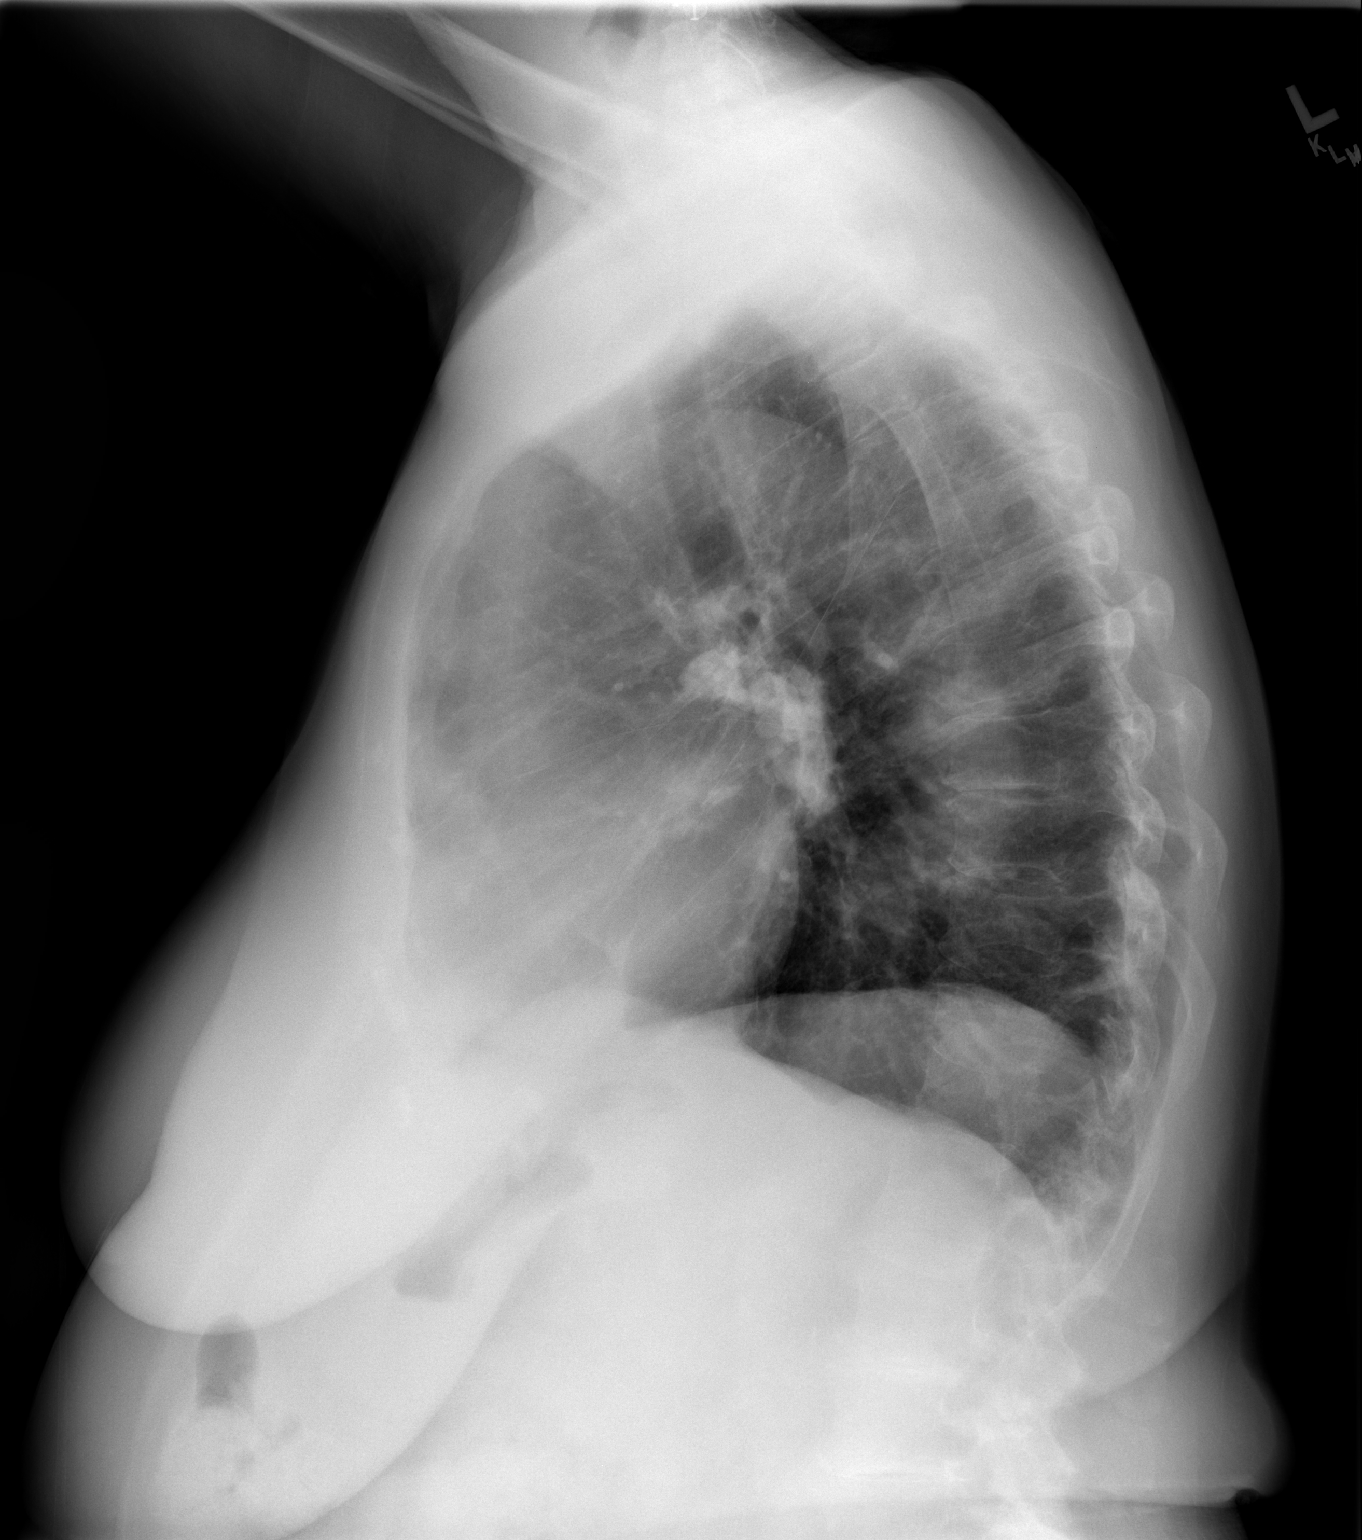

[2 of 2 positions shown; findings below may reference images not displayed]

FINDINGS: No active infiltrate or effusion is seen. Mediastinal contours are
normal. There are heavily calcified right hilar and subcarinal lymph
nodes present consistent with prior granulomatous disease. Also, a
right lower lobe calcified granuloma is noted. The heart is mildly
enlarged. The bones are osteopenic. There is a thoracolumbar
scoliosis present.
IMPRESSION: 1. No active lung disease.
2. Calcified subcarinal and right hilar lymph nodes consistent with
prior granulomatous disease.

## 2015-01-30 ENCOUNTER — Other Ambulatory Visit: Payer: Self-pay | Admitting: Cardiology

## 2015-02-01 NOTE — Telephone Encounter (Signed)
Spoke to patient. Her PCP is refilling this for her.

## 2015-02-01 NOTE — Telephone Encounter (Signed)
Patient is prn follow up. Ok to refill or should this be sent to pcp? Please advise. Thanks, MI

## 2015-02-02 ENCOUNTER — Ambulatory Visit: Payer: PPO | Attending: Family Medicine | Admitting: Rehabilitation

## 2015-02-02 ENCOUNTER — Encounter: Payer: Self-pay | Admitting: Rehabilitation

## 2015-02-02 DIAGNOSIS — M545 Low back pain, unspecified: Secondary | ICD-10-CM

## 2015-02-02 DIAGNOSIS — R262 Difficulty in walking, not elsewhere classified: Secondary | ICD-10-CM | POA: Diagnosis present

## 2015-02-02 NOTE — Therapy (Signed)
[facility] [address] [place], [place], [address] Phone: [phone]   Fax:  [phone]  Physical Therapy Evaluation  Patient Details  Name: Susan Marks MRN: [id number] Date of Birth: 01/31/1926 Referring Provider: Jonathon Jordan, MD  Encounter Date: 02/02/2015      PT End of Session - 02/02/15 1730    Visit Number 1   Date for PT Re-Evaluation 03/30/15   PT Start Time [id number]   PT Stop Time 1459   PT Time Calculation (min) 44 min   Activity Tolerance Patient tolerated treatment well      Past Medical History  Diagnosis Date  . High blood pressure   . High cholesterol   . Chronic headaches     migraines    Past Surgical History  Procedure Laterality Date  . Vesicovaginal fistula closure w/ tah      There were no vitals filed for this visit.  Visit Diagnosis:  Difficulty walking - Plan: PT plan of care cert/re-cert  Midline low back pain without sciatica - Plan: PT plan of care cert/re-cert      Subjective Assessment - 02/02/15 1621    Subjective Pt was here previously with same balance problem, reporting that it may have gotten slightly worse; possibly ready to use a SPC.  Reports therapy helped in the past.  Balance is poor after sitting for a long period of time and then getting up to go.  Reports no falls.     Pertinent History low back pain/scoliosis, bilateral bursitis   Limitations Walking;Standing   How long can you walk comfortably? walking slower than usual per patient   Patient Stated Goals learn how to use SPC, return to HEP, improve balance with sweeping and taking out the trash   Currently in Pain? No/denies  does occasionally have low back pain            [facility] PT Assessment - 02/02/15 0001    Assessment   Medical Diagnosis balance training and leg pain   Referring Provider Jonathon Jordan, MD   Onset Date/Surgical Date 08/29/14   Next MD Visit only 6 month appt  scheduled   Prior Therapy yes   Precautions   Precautions Fall   Restrictions   Weight Bearing Restrictions No   Balance Screen   Has the patient fallen in the past 6 months No   Has the patient had a decrease in activity level because of a fear of falling?  Yes   Is the patient reluctant to leave their home because of a fear of falling?  No   Home Ecologist residence   Living Arrangements Spouse/significant other   Type of [place] One level   Prior Function   Level of Independence Independent   Cognition   Overall Cognitive Status Within Functional Limits for tasks assessed   Observation/Other Assessments   Focus on Therapeutic Outcomes (FOTO)  48% limited   ROM / Strength   AROM / PROM / Strength Strength   Strength   Overall Strength Comments tested seated: all hip, knee, and ankle 4/5 or greater and equal between sides   Balance   Balance Assessed Yes   Standardized Balance Assessment   Standardized Balance Assessment Berg Balance Test;Timed Up and Go Test;Five Times Sit to Stand   Five times sit to stand comments  17"   Berg Balance Test   Sit to Stand Able to stand  without using hands and stabilize independently   Standing Unsupported Able to stand safely 2 minutes   Sitting with Back Unsupported but Feet Supported on Floor or Stool Able to sit safely and securely 2 minutes   Stand to Sit Sits safely with minimal use of hands   Transfers Able to transfer safely, minor use of hands   Standing Unsupported with Eyes Closed Able to stand 10 seconds with supervision  but very fearful   Standing Ubsupported with Feet Together Able to place feet together independently and stand 1 minute safely   From Standing, Reach Forward with Outstretched Arm Can reach confidently >25 cm (10")   From Standing Position, Pick up Object from Floor Able to pick up shoe, needs supervision   From Standing Position, Turn to Look Behind Over each  Shoulder Turn sideways only but maintains balance   Turn 360 Degrees Able to turn 360 degrees safely in 4 seconds or less   Standing Unsupported, Alternately Place Feet on Step/Stool Able to stand independently and safely and complete 8 steps in 20 seconds   Standing Unsupported, One Foot in [employer] balance while stepping or standing  unable to get in position and can hold 6" one in position   Standing on One Leg Able to lift leg independently and hold equal to or more than 3 seconds   Total Score 46   Timed Up and Go Test   Normal TUG (seconds) 11                           PT Education - 02/02/15 1730    Education provided Yes   Education Details daignosis, POC, appropriate SPC to purchase   Person(s) Educated Patient   Methods Explanation;Demonstration   Comprehension Verbalized understanding          PT Short Term Goals - 02/02/15 1735    PT SHORT TERM GOAL #1   Title pt will demonstrate proper sequencing with a SPC for community use   Time 2   Period Weeks   Status New           PT Long Term Goals - 02/02/15 1735    PT LONG TERM GOAL #1   Title pt will report no unsteadiness with getting up to walk after sitting x1.5 hours   Time 8   Period Weeks   Status New   PT LONG TERM GOAL #2   Title pt will improve BERG balance score to >49/56   Time 8   Period Weeks   Status New   PT LONG TERM GOAL #3   Title pt will report no LOB when taking the garbage out of the trashcan   Time 8   Period Weeks   Status New               Plan - 02/02/15 1731    Clinical Impression Statement Pt presents with decreased ability to perform higher level balance activities; including tandem stance, single leg stance, and eyes closed.  Pt will benefit from obtaining a SPC for gait training in the clinic for home use.  Pt reported success with the same issue in prior therapy here. No history of falls but patient is fearful of falling.     Pt will benefit  from skilled therapeutic intervention in order to improve on the following deficits Decreased knowledge of precautions;Decreased mobility;Difficulty walking;Decreased balance   Rehab Potential Good   PT Frequency 2x /  week   PT Duration 8 weeks   PT Treatment/Interventions ADLs/Self Care Home Management;DME Instruction;Gait training;Functional mobility training;Therapeutic activities;Therapeutic exercise;Balance training;Neuromuscular re-education;Patient/family education   PT Next Visit Plan begin balance traning incorporating higher level activities, gait training with SPC, DGI?   Consulted and Agree with Plan of Care Patient          G-Codes - Mar 04, 2015 1738    Functional Assessment Tool Used FOTO   Functional Limitation Mobility: Walking and moving around   Mobility: Walking and Moving Around Current Status [id number]) At least 40 percent but less than 60 percent impaired, limited or restricted   Mobility: Walking and Moving Around Goal Status [phone]) At least 20 percent but less than 40 percent impaired, limited or restricted       Problem List Patient Active Problem List   Diagnosis Date Noted  . Chest pain 03/10/2014  . Benign essential HTN 03/10/2014  . Abnormal EKG 03/10/2014  . GERD (gastroesophageal reflux disease) 07/07/2013  . Granulomatous lung disease 07/07/2013  . Cough 06/13/2013    Stark Bray, DPT, CMP 03/04/15, 5:41 PM  [facility] [address], [place], [place], [address] Phone: [phone]   Fax:  [phone]  Name: Susan Marks MRN: [id number] Date of Birth: [birth year]

## 2015-02-15 ENCOUNTER — Encounter: Payer: Self-pay | Admitting: Physical Therapy

## 2015-02-15 ENCOUNTER — Ambulatory Visit: Payer: PPO | Admitting: Physical Therapy

## 2015-02-15 DIAGNOSIS — R262 Difficulty in walking, not elsewhere classified: Secondary | ICD-10-CM

## 2015-02-15 DIAGNOSIS — M545 Low back pain, unspecified: Secondary | ICD-10-CM

## 2015-02-15 NOTE — Therapy (Signed)
[facility] [address], [place], [place], [address] Phone: [phone]   Fax:  [phone]  Physical Therapy Treatment  Patient Details  Name: Susan Marks MRN: [id number] Date of Birth: Sep 19, 1925 Referring Provider: Jonathon Jordan, MD  Encounter Date: 02/15/2015      PT End of Session - 02/15/15 1115    Visit Number 2   Date for PT Re-Evaluation 03/30/15   PT Start Time 1100   PT Stop Time 1144   PT Time Calculation (min) 44 min   Activity Tolerance Patient tolerated treatment well      Past Medical History  Diagnosis Date  . High blood pressure   . High cholesterol   . Chronic headaches     migraines    Past Surgical History  Procedure Laterality Date  . Vesicovaginal fistula closure w/ tah      There were no vitals filed for this visit.  Visit Diagnosis:  Difficulty walking  Midline low back pain without sciatica      Subjective Assessment - 02/15/15 1053    Subjective Pt reports very difficult for her to find balance after prolonged sitting as noticed when eating at a restaurant. Pt wishes with PT to help with her unsteady gait.   Currently in Pain? No/denies                         [facility] Adult PT Treatment/Exercise - 02/15/15 0001    Ambulation/Gait   Ambulation/Gait Yes   Gait Comments --  stop and go, looking up/down & Lt/Rt, sidestepping Lt/Rt,     Balance   Balance Assessed Yes   Exercises   Exercises Knee/Hip   Knee/Hip Exercises: Aerobic   Nustep 66min, Level 1,    seat#6, arms #8   Knee/Hip Exercises: Standing   Hip ADduction Strengthening;2 sets;10 reps   3# added    Hip Extension Stengthening;2 sets;10 reps  3# added   Rebounder 3 directions x73min each    Other Standing Knee Exercises Tandem stance 2 x 10 each leg                  PT Short Term Goals - 02/15/15 1336    PT SHORT TERM GOAL #1   Title pt will demonstrate proper  sequencing with a SPC for community use   Time 2   Period Weeks   Status On-going           PT Long Term Goals - 02/02/15 1735    PT LONG TERM GOAL #1   Title pt will report no unsteadiness with getting up to walk after sitting x1.5 hours   Time 8   Period Weeks   Status New   PT LONG TERM GOAL #2   Title pt will improve BERG balance score to >49/56   Time 8   Period Weeks   Status New   PT LONG TERM GOAL #3   Title pt will report no LOB when taking the garbage out of the trashcan   Time 8   Period Weeks   Status New               Plan - 02/15/15 1116    Clinical Impression Statement Pt with decreased ability to perform higher level balance activities, with sudden change in directions. Pt is planning to purchase a SPC. Pt will continue to benefit from skilled Pt to improve unsteady gait   Pt  will benefit from skilled therapeutic intervention in order to improve on the following deficits Decreased knowledge of precautions;Decreased mobility;Difficulty walking;Decreased balance   Rehab Potential Good   PT Frequency 2x / week   PT Duration 8 weeks   PT Treatment/Interventions ADLs/Self Care Home Management;DME Instruction;Gait training;Functional mobility training;Therapeutic activities;Therapeutic exercise;Balance training;Neuromuscular re-education;Patient/family education   PT Next Visit Plan continue with balance training and higher level activities, gait training with SPC   Consulted and Agree with Plan of Care Patient        Problem List Patient Active Problem List   Diagnosis Date Noted  . Chest pain 03/10/2014  . Benign essential HTN 03/10/2014  . Abnormal EKG 03/10/2014  . GERD (gastroesophageal reflux disease) 07/07/2013  . Granulomatous lung disease 07/07/2013  . Cough 06/13/2013    NAUMANN-HOUEGNIFIO,Ayen Viviano PTA 02/15/2015, 1:37 PM  [facility] [address] [place], [place],  [address] Phone: [phone]   Fax:  [phone]  Name: Susan Marks MRN: [id number] Date of Birth: [birth year]

## 2015-02-17 ENCOUNTER — Ambulatory Visit: Payer: PPO

## 2015-02-17 DIAGNOSIS — M545 Low back pain, unspecified: Secondary | ICD-10-CM

## 2015-02-17 DIAGNOSIS — R262 Difficulty in walking, not elsewhere classified: Secondary | ICD-10-CM

## 2015-02-17 NOTE — Therapy (Signed)
[facility] [address] [place], [place], [address] Phone: [phone]   Fax:  [phone]  Physical Therapy Treatment  Patient Details  Name: Susan Marks MRN: [id number] Date of Birth: 09/27/1925 Referring Provider: Jonathon Jordan, MD  Encounter Date: 02/17/2015      PT End of Session - 02/17/15 1003    Visit Number 3   Number of Visits 10   Date for PT Re-Evaluation 03/30/15   PT Start Time 0930   PT Stop Time 1011   PT Time Calculation (min) 41 min   Activity Tolerance Patient tolerated treatment well   Behavior During Therapy [facility] for tasks assessed/performed      Past Medical History  Diagnosis Date  . High blood pressure   . High cholesterol   . Chronic headaches     migraines    Past Surgical History  Procedure Laterality Date  . Vesicovaginal fistula closure w/ tah      There were no vitals filed for this visit.  Visit Diagnosis:  Difficulty walking  Midline low back pain without sciatica      Subjective Assessment - 02/17/15 0941    Subjective My legs didn't cramp when using NuStep like they do on the bike.  Pt does not have HEP and would like to get started today.     Currently in Pain? No/denies                         [facility] Adult PT Treatment/Exercise - 02/17/15 0001    Exercises   Exercises Ankle   Knee/Hip Exercises: Aerobic   Nustep 70min, Level 1,    seat#6, arms #8   Knee/Hip Exercises: Standing   Hip Abduction Stengthening;Both;2 sets;10 reps;Knee straight   Abduction Limitations 3#    Hip Extension Stengthening;2 sets;10 reps  3# added   Rocker Board 3 minutes   Rebounder 3 directions x22min each    Other Standing Knee Exercises standing marching 3# added 2x10   Knee/Hip Exercises: Seated   Long Arc Quad Both;2 sets;10 reps   Long Arc Quad Weight 3 lbs.   Knee/Hip Flexion 2x10   Marching Weights 3 lbs.   Ankle Exercises: Seated   Heel Raises  20 reps   Toe Raise 20 reps                PT Education - 02/17/15 [phone]    Education provided Yes   Education Details HEP: standing hip, seated LE strength   Person(s) Educated Patient   Methods Explanation;Demonstration;Handout   Comprehension Verbalized understanding;Returned demonstration          PT Short Term Goals - 02/17/15 0944    PT SHORT TERM GOAL #1   Title pt will demonstrate proper sequencing with a SPC for community use   Time 2   Period Weeks   Status On-going  Pt has not purchased a cane           PT Long Term Goals - 02/02/15 1735    PT LONG TERM GOAL #1   Title pt will report no unsteadiness with getting up to walk after sitting x1.5 hours   Time 8   Period Weeks   Status New   PT LONG TERM GOAL #2   Title pt will improve BERG balance score to >49/56   Time 8   Period Weeks   Status New   PT LONG TERM GOAL #3  Title pt will report no LOB when taking the garbage out of the trashcan   Time 8   Period Weeks   Status New               Problem List Patient Active Problem List   Diagnosis Date Noted  . Chest pain 03/10/2014  . Benign essential HTN 03/10/2014  . Abnormal EKG 03/10/2014  . GERD (gastroesophageal reflux disease) 07/07/2013  . Granulomatous lung disease 07/07/2013  . Cough 06/13/2013    TAKACS,KELLY, PT 02/17/2015, 10:06 AM  [facility] [address] [place], [place], [address] Phone: [phone]   Fax:  [phone]  Name: TABBIE MARC MRN: [id number] Date of Birth: 1925-10-18

## 2015-02-17 NOTE — Patient Instructions (Signed)
KNEE: Extension, Long Arc Quad (Weight)  Place weight around leg. Raise leg until knee is straight. Hold _5__ seconds. Use ___ lb weight. _10__ reps per set (each leg), 4-5__ sets per day, __7_ days per week  Copyright  VHI. All rights reserved.      Knee Raise   Lift knee and then lower it. Repeat with other knee. Repeat _10__ times each leg. Do _4-5___ sessions per day.  http://gt2.exer.us/445   Copyright  VHI. All rights reserved.  Toe Up   Gently rise up on toes and back on heels. Repeat _20___ times. Do 4-5____ sessions per day.  Knee High   Holding stable object, raise knee to hip level, then lower knee. Repeat with other knee. Complete __10_ repetitions. Do __2__ sessions per day.  ABDUCTION: Standing (Active)   Stand, feet flat. Lift right leg out to side. Use _0__ lbs. Complete __10_ repetitions. Perform __2_ sessions per day.   EXTENSION: Standing (Active)  Stand, both feet flat. Draw right leg behind body as far as possible. Use 0___ lbs. Complete 10 repetitions. Perform __2_ sessions per day.  Copyright  VHI. All rights reserved.    [facility] [address] [place], [place] [address] Phone # [phone] Fax [phone]

## 2015-02-22 ENCOUNTER — Encounter: Payer: Self-pay | Admitting: Physical Therapy

## 2015-02-22 ENCOUNTER — Ambulatory Visit: Payer: PPO | Attending: Family Medicine | Admitting: Physical Therapy

## 2015-02-22 DIAGNOSIS — R262 Difficulty in walking, not elsewhere classified: Secondary | ICD-10-CM

## 2015-02-22 DIAGNOSIS — M545 Low back pain, unspecified: Secondary | ICD-10-CM

## 2015-02-22 NOTE — Therapy (Signed)
[facility] [address] [place], [place], [address] Phone: [phone]   Fax:  [phone]  Physical Therapy Treatment  Patient Details  Name: Susan Marks MRN: [id number] Date of Birth: 12-29-1925 Referring Provider: Jonathon Jordan, MD  Encounter Date: 02/22/2015      PT End of Session - 02/22/15 0857    Visit Number 4   Number of Visits 10   Date for PT Re-Evaluation 03/30/15   PT Start Time 0850   PT Stop Time 0930   PT Time Calculation (min) 40 min   Activity Tolerance Patient tolerated treatment well   Behavior During Therapy [facility] for tasks assessed/performed      Past Medical History  Diagnosis Date  . High blood pressure   . High cholesterol   . Chronic headaches     migraines    Past Surgical History  Procedure Laterality Date  . Vesicovaginal fistula closure w/ tah      There were no vitals filed for this visit.  Visit Diagnosis:  Difficulty walking  Midline low back pain without sciatica      Subjective Assessment - 02/22/15 0856    Subjective Pt reports had a very bad cramp last Wednesday   Currently in Pain? No/denies                         [facility] Adult PT Treatment/Exercise - 02/22/15 0001    Ambulation/Gait   Ambulation/Gait Yes   Gait Comments stop & go, turning head Lt & Rt lokking up and down with close S    Exercises   Exercises Ankle   Knee/Hip Exercises: Aerobic   Nustep 44min, Level 1,    Seat#6, arms #8   Knee/Hip Exercises: Standing   Hip Abduction Stengthening;Both;2 sets;10 reps;Knee straight   Abduction Limitations 3#    Hip Extension Stengthening;2 sets;10 reps   Extension Limitations 3#   Rebounder 3 directions x48min each    Other Standing Knee Exercises Tandem stance 2 x 10 each leg   Ankle Exercises: Seated   Heel Raises 20 reps   Toe Raise 20 reps                  PT Short Term Goals - 02/22/15 0901    PT SHORT TERM  GOAL #1   Title pt will demonstrate proper sequencing with a SPC for community use   Time 2   Period Weeks   Status On-going           PT Long Term Goals - 02/22/15 0902    PT LONG TERM GOAL #1   Title pt will report no unsteadiness with getting up to walk after sitting x1.5 hours   Time 8   Period Weeks   Status On-going   PT LONG TERM GOAL #2   Title pt will improve BERG balance score to >49/56   Time 8   Period Weeks   Status On-going   PT LONG TERM GOAL #3   Title pt will report no LOB when taking the garbage out of the trashcan   Time 8   Period Weeks   Status On-going               Plan - 02/22/15 [id number]    Clinical Impression Statement Pt able to perform gentle strengthening exercises and balance activities.    Pt will benefit from skilled therapeutic intervention in order to improve on the  following deficits Decreased knowledge of precautions;Decreased mobility;Difficulty walking;Decreased balance   Rehab Potential Good   PT Frequency 2x / week   PT Duration 8 weeks   PT Treatment/Interventions ADLs/Self Care Home Management;DME Instruction;Gait training;Functional mobility training;Therapeutic activities;Therapeutic exercise;Balance training;Neuromuscular re-education;Patient/family education   PT Next Visit Plan continue with balance training and higher level activities, gait training with SPC   Consulted and Agree with Plan of Care Patient        Problem List Patient Active Problem List   Diagnosis Date Noted  . Chest pain 03/10/2014  . Benign essential HTN 03/10/2014  . Abnormal EKG 03/10/2014  . GERD (gastroesophageal reflux disease) 07/07/2013  . Granulomatous lung disease 07/07/2013  . Cough 06/13/2013    NAUMANN-HOUEGNIFIO,Saveah Bahar PTA 02/22/2015, 2:23 PM  [facility] [address], [place], [place], [address] Phone: [phone]   Fax:  [phone]  Name: Susan Marks MRN: [id number] Date of Birth: 07-Aug-1925

## 2015-02-24 ENCOUNTER — Ambulatory Visit: Payer: PPO | Admitting: Physical Therapy

## 2015-02-24 ENCOUNTER — Encounter: Payer: Self-pay | Admitting: Physical Therapy

## 2015-02-24 DIAGNOSIS — R262 Difficulty in walking, not elsewhere classified: Secondary | ICD-10-CM

## 2015-02-24 DIAGNOSIS — M545 Low back pain, unspecified: Secondary | ICD-10-CM

## 2015-02-24 NOTE — Therapy (Signed)
[facility] [address], [place], [place], [address] Phone: [phone]   Fax:  [phone]  Physical Therapy Treatment  Patient Details  Name: Susan Marks MRN: [id number] Date of Birth: 07-07-1925 Referring Provider: Jonathon Jordan, MD  Encounter Date: 02/24/2015      PT End of Session - 02/24/15 0906    Visit Number 5   Number of Visits 10   Date for PT Re-Evaluation 03/30/15   PT Start Time [id number]   PT Stop Time 0935   PT Time Calculation (min) 41 min   Activity Tolerance Patient tolerated treatment well   Behavior During Therapy [facility] for tasks assessed/performed      Past Medical History  Diagnosis Date  . High blood pressure   . High cholesterol   . Chronic headaches     migraines    Past Surgical History  Procedure Laterality Date  . Vesicovaginal fistula closure w/ tah      There were no vitals filed for this visit.  Visit Diagnosis:  Difficulty walking  Midline low back pain without sciatica      Subjective Assessment - 02/24/15 0852    Subjective Pt reports after prolonged sitting it is challenging to walk, this morning she noticed a good pace with walking   Currently in Pain? No/denies                         [facility] Adult PT Treatment/Exercise - 02/24/15 0001    Ambulation/Gait   Ambulation/Gait Yes   Gait Comments stop & go, turning head Lt & Rt looking up/down with close S   truning in place Lt/Rt and sidestepping Lt/Rt initially with   Exercises   Exercises Ankle   Knee/Hip Exercises: Aerobic   Nustep 6min, Level 1,    seat#6 arms # 8   Knee/Hip Exercises: Standing   Hip Abduction Stengthening;Both;2 sets;10 reps;Knee straight   Abduction Limitations 3#    Hip Extension Stengthening;2 sets;10 reps   Extension Limitations 3#   Rebounder 3 directions x94min each    Other Standing Knee Exercises Tandem stance 2 x 10 each leg   Ankle Exercises: Seated   Heel  Raises 20 reps  on stairs    Toe Raise 20 reps  on stairs                  PT Short Term Goals - 02/24/15 0907    PT SHORT TERM GOAL #1   Title pt will demonstrate proper sequencing with a SPC for community use   Time 2   Period Weeks   Status On-going           PT Long Term Goals - 02/24/15 0907    PT LONG TERM GOAL #1   Title pt will report no unsteadiness with getting up to walk after sitting x1.5 hours   Time 8   Period Weeks   Status On-going   PT LONG TERM GOAL #2   Title pt will improve BERG balance score to >49/56   Time 8   Period Weeks   Status On-going   PT LONG TERM GOAL #3   Title pt will report no LOB when taking the garbage out of the trashcan   Time 8   Period Weeks   Status On-going               Plan - 02/24/15 1026    Clinical Impression Statement  Pt able to tolerate balance activities and shows good balance with tandem stance. Pt is challenged with walking and taking turns for changing directions   Pt will benefit from skilled therapeutic intervention in order to improve on the following deficits Decreased knowledge of precautions;Decreased mobility;Difficulty walking;Decreased balance   Rehab Potential Good   PT Frequency 2x / week   PT Duration 8 weeks   PT Treatment/Interventions ADLs/Self Care Home Management;DME Instruction;Gait training;Functional mobility training;Therapeutic activities;Therapeutic exercise;Balance training;Neuromuscular re-education;Patient/family education   PT Next Visit Plan continue with balance training and higher level activities, gait training with SPC   Consulted and Agree with Plan of Care Patient        Problem List Patient Active Problem List   Diagnosis Date Noted  . Chest pain 03/10/2014  . Benign essential HTN 03/10/2014  . Abnormal EKG 03/10/2014  . GERD (gastroesophageal reflux disease) 07/07/2013  . Granulomatous lung disease 07/07/2013  . Cough 06/13/2013     NAUMANN-HOUEGNIFIO,Domenic Schoenberger PTA 02/24/2015, 10:27 AM  [facility] [address] [place], [place], [address] Phone: [phone]   Fax:  [phone]  Name: Susan Marks MRN: [id number] Date of Birth: Jul 16, 1925

## 2015-03-01 ENCOUNTER — Encounter: Payer: Self-pay | Admitting: Physical Therapy

## 2015-03-01 ENCOUNTER — Ambulatory Visit: Payer: PPO | Admitting: Physical Therapy

## 2015-03-01 DIAGNOSIS — R262 Difficulty in walking, not elsewhere classified: Secondary | ICD-10-CM

## 2015-03-01 DIAGNOSIS — M545 Low back pain, unspecified: Secondary | ICD-10-CM

## 2015-03-01 NOTE — Therapy (Signed)
[facility] [address], [place], [place], [address] Phone: [phone]   Fax:  [phone]  Physical Therapy Treatment  Patient Details  Name: Susan Marks MRN: [id number] Date of Birth: [birth year] Referring Provider: Jonathon Jordan, MD  Encounter Date: 03/01/2015      PT End of Session - 03/01/15 1027    Visit Number 6   Number of Visits 10   Date for PT Re-Evaluation 03/30/15   PT Start Time 1018   PT Stop Time 1100   PT Time Calculation (min) 42 min   Activity Tolerance Patient tolerated treatment well   Behavior During Therapy [facility] for tasks assessed/performed      Past Medical History  Diagnosis Date  . High blood pressure   . High cholesterol   . Chronic headaches     migraines    Past Surgical History  Procedure Laterality Date  . Vesicovaginal fistula closure w/ tah      There were no vitals filed for this visit.  Visit Diagnosis:  Difficulty walking  Midline low back pain without sciatica      Subjective Assessment - 03/01/15 1024    Subjective Pt reports awarness with ambulation esspecially with turns improved. Pt is a little congested today, but no fever. Pt thinking about getting a cane   Currently in Pain? No/denies                         [facility] Adult PT Treatment/Exercise - 03/01/15 0001    Ambulation/Gait   Ambulation/Gait Yes   Gait Comments stop & go, turning head Lt & Rt looking up/down with close S   turning Lt/Rt, sidesteppping    Self-Care   Self-Care Other Self-Care Comments  educated pt. in advantage of "hurrycane" over [facility] & googled    Exercises   Exercises --   Knee/Hip Exercises: Aerobic   Nustep 35min, Level 1,    seat#6, arms #8   Knee/Hip Exercises: Standing   Hip Abduction Stengthening;Both;2 sets;10 reps;Knee straight   Abduction Limitations 3#    Hip Extension Stengthening;2 sets;10 reps   Extension Limitations 3#   Rebounder 3  directions x65min each    Other Standing Knee Exercises Tandem stance 2 x 10sec each leg   Ankle Exercises: Seated   Heel Raises 20 reps  on stairs   Toe Raise 20 reps  on stairs                  PT Short Term Goals - 03/01/15 1028    PT SHORT TERM GOAL #1   Title pt will demonstrate proper sequencing with a SPC for community use   Time 2   Period Weeks   Status On-going           PT Long Term Goals - 03/01/15 1029    PT LONG TERM GOAL #1   Title pt will report no unsteadiness with getting up to walk after sitting x1.5 hours   Time 8   Period Weeks   Status On-going   PT LONG TERM GOAL #2   Title pt will improve BERG balance score to >49/56   Time 8   Period Weeks   Status On-going   PT LONG TERM GOAL #3   Title pt will report no LOB when taking the garbage out of the trashcan   Time 8   Period Weeks   Status On-going  Problem List Patient Active Problem List   Diagnosis Date Noted  . Chest pain 03/10/2014  . Benign essential HTN 03/10/2014  . Abnormal EKG 03/10/2014  . GERD (gastroesophageal reflux disease) 07/07/2013  . Granulomatous lung disease 07/07/2013  . Cough 06/13/2013    NAUMANN-HOUEGNIFIO,Andreas Sobolewski PTA 03/01/2015, 11:04 AM  [facility] [address], [place], [place], [address] Phone: [phone]   Fax:  [phone]  Name: ESMERALDA VONNAHME MRN: [id number] Date of Birth: 1925-11-02

## 2015-03-03 ENCOUNTER — Ambulatory Visit: Payer: PPO

## 2015-03-03 DIAGNOSIS — R262 Difficulty in walking, not elsewhere classified: Secondary | ICD-10-CM

## 2015-03-03 NOTE — Patient Instructions (Signed)
HIP: Hamstrings - Short Sitting    Rest leg on raised surface. Keep knee straight. Lift chest. Hold _20__ seconds. _3__ reps per set, _3__ sets per day,  Copyright  VHI. All rights reserved.  [facility] [address], [place], [place] [address] Phone # [phone] Fax [phone]

## 2015-03-03 NOTE — Therapy (Signed)
[facility] [address] [place], [place], [address] Phone: [phone]   Fax:  [phone]  Physical Therapy Treatment  Patient Details  Name: Susan Marks MRN: [id number] Date of Birth: [birth year] Referring Provider: Jonathon Jordan, MD  Encounter Date: 03/03/2015      PT End of Session - 03/03/15 1013    Visit Number 7   Number of Visits 10   Date for PT Re-Evaluation 03/30/15   PT Start Time 0941   PT Stop Time 1014   PT Time Calculation (min) 33 min   Activity Tolerance Patient tolerated treatment well  Pt was late for appt   Behavior During Therapy [facility] for tasks assessed/performed      Past Medical History  Diagnosis Date  . High blood pressure   . High cholesterol   . Chronic headaches     migraines    Past Surgical History  Procedure Laterality Date  . Vesicovaginal fistula closure w/ tah      There were no vitals filed for this visit.  Visit Diagnosis:  Difficulty walking      Subjective Assessment - 03/03/15 0942    Subjective Pt reports compliance with HEP just not as often as prescribed.     Currently in Pain? No/denies                         [facility] Adult PT Treatment/Exercise - 03/03/15 0001    Knee/Hip Exercises: Stretches   Active Hamstring Stretch 3 reps;20 seconds;Both   Knee/Hip Exercises: Aerobic   Nustep Level 2x 8 minutes    seat#6, arms #8   Knee/Hip Exercises: Standing   Hip Abduction Stengthening;Both;2 sets;10 reps;Knee straight   Abduction Limitations 3#    Hip Extension Stengthening;2 sets;10 reps   Extension Limitations 3#   Rebounder 3 directions x79min each    Walking with Sports Cord 15 # forward x 10    Other Standing Knee Exercises Tandem stance 2 x 10sec each leg                PT Education - 03/03/15 1005    Education provided Yes   Education Details hamstring stretch   Person(s) Educated Patient   Methods  Explanation;Demonstration;Handout   Comprehension Verbalized understanding;Returned demonstration          PT Short Term Goals - 03/03/15 0942    PT SHORT TERM GOAL #1   Title pt will demonstrate proper sequencing with a SPC for community use   Time 2   Period Weeks   Status On-going  Pt has not bought a cane           PT Long Term Goals - 03/01/15 1029    PT LONG TERM GOAL #1   Title pt will report no unsteadiness with getting up to walk after sitting x1.5 hours   Time 8   Period Weeks   Status On-going   PT LONG TERM GOAL #2   Title pt will improve BERG balance score to >49/56   Time 8   Period Weeks   Status On-going   PT LONG TERM GOAL #3   Title pt will report no LOB when taking the garbage out of the trashcan   Time 8   Period Weeks   Status On-going               Plan - 03/03/15 0946    Clinical Impression Statement Pt is  tolerating all exercise in the clinic well and reports moderate compliance with HEP.  Pt has not bought a cane so not able to assess STG regarding cane use.  Pt with continued balance and endurance deficits and will benefit from skilled PT for safe progression of balance exercises.     Pt will benefit from skilled therapeutic intervention in order to improve on the following deficits Decreased knowledge of precautions;Decreased mobility;Difficulty walking;Decreased balance   Rehab Potential Good   PT Frequency 2x / week   PT Duration 8 weeks   PT Treatment/Interventions ADLs/Self Care Home Management;DME Instruction;Gait training;Functional mobility training;Therapeutic activities;Therapeutic exercise;Balance training;Neuromuscular re-education;Patient/family education   PT Next Visit Plan continue with balance training and higher level activities, gait training   Consulted and Agree with Plan of Care Patient        Problem List Patient Active Problem List   Diagnosis Date Noted  . Chest pain 03/10/2014  . Benign essential HTN  03/10/2014  . Abnormal EKG 03/10/2014  . GERD (gastroesophageal reflux disease) 07/07/2013  . Granulomatous lung disease 07/07/2013  . Cough 06/13/2013    Janely Gullickson, PT 03/03/2015, 10:14 AM  [facility] [address], [place], [place], [address] Phone: [phone]   Fax:  [phone]  Name: Susan Marks MRN: [id number] Date of Birth: 01-07-1926

## 2015-03-08 ENCOUNTER — Ambulatory Visit: Payer: PPO

## 2015-03-08 DIAGNOSIS — R262 Difficulty in walking, not elsewhere classified: Secondary | ICD-10-CM

## 2015-03-08 DIAGNOSIS — M545 Low back pain, unspecified: Secondary | ICD-10-CM

## 2015-03-08 NOTE — Therapy (Signed)
[facility] [address], [place], [place], [address] Phone: [phone]   Fax:  [phone]  Physical Therapy Treatment  Patient Details  Name: Susan Marks MRN: [id number] Date of Birth: 07/02/1925 Referring Provider: Jonathon Jordan, MD  Encounter Date: 03/08/2015      PT End of Session - 03/08/15 1016    Visit Number 8   Number of Visits 10   Date for PT Re-Evaluation 03/30/15   PT Start Time 0933   PT Stop Time 1017   PT Time Calculation (min) 44 min   Activity Tolerance Patient tolerated treatment well   Behavior During Therapy [facility] for tasks assessed/performed      Past Medical History  Diagnosis Date  . High blood pressure   . High cholesterol   . Chronic headaches     migraines    Past Surgical History  Procedure Laterality Date  . Vesicovaginal fistula closure w/ tah      There were no vitals filed for this visit.  Visit Diagnosis:  Difficulty walking  Midline low back pain without sciatica      Subjective Assessment - 03/08/15 0939    Subjective doing exercises 2x/day.  Busy getting ready for Christmas.   Currently in Pain? No/denies                         [facility] Adult PT Treatment/Exercise - 03/08/15 0001    Knee/Hip Exercises: Stretches   Active Hamstring Stretch 3 reps;20 seconds;Both   Knee/Hip Exercises: Aerobic   Nustep Level 2x 8 minutes    seat#6, arms #8   Knee/Hip Exercises: Standing   Hip Abduction Stengthening;Both;2 sets;10 reps;Knee straight   Abduction Limitations 3#    Hip Extension Stengthening;2 sets;10 reps   Extension Limitations 3#   Forward Step Up Both;10 reps;Hand Hold: 2;2 sets   Rebounder 3 directions x3min each    Walking with Sports Cord 15 # forward x 10    Other Standing Knee Exercises Tandem stance 2 x 10sec each leg                  PT Short Term Goals - 03/08/15 0944    PT SHORT TERM GOAL #1   Title pt will  demonstrate proper sequencing with a SPC for community use   Period Weeks   Status On-going  hasnt bought a cane           PT Long Term Goals - 03/08/15 0940    PT LONG TERM GOAL #1   Title pt will report no unsteadiness with getting up to walk after sitting x1.5 hours   Time 8   Period Weeks   Status On-going   PT LONG TERM GOAL #3   Title pt will report no LOB when taking the garbage out of the trashcan   Time 8   Period Weeks   Status On-going  hasnt had to do it the past couple of weeks               Plan - 03/08/15 0944    Clinical Impression Statement Pt has not had to take out the trash over the past couple of weeks.  Pt reports that she feels more unsteady later in the day.  Pt is able to tolerate all exercise in the clinic well. Pt demonstrates instability with tandem stance Pt with continued balance and endurance deficits and will benefit from skilled PT for  safe progression  of balance exercises.     Pt will benefit from skilled therapeutic intervention in order to improve on the following deficits Decreased knowledge of precautions;Decreased mobility;Difficulty walking;Decreased balance   Rehab Potential Good   PT Frequency 2x / week   PT Duration 8 weeks   PT Treatment/Interventions ADLs/Self Care Home Management;DME Instruction;Gait training;Functional mobility training;Therapeutic activities;Therapeutic exercise;Balance training;Neuromuscular re-education;Patient/family education   PT Next Visit Plan continue with balance training and higher level activities, gait training   Consulted and Agree with Plan of Care Patient        Problem List Patient Active Problem List   Diagnosis Date Noted  . Chest pain 03/10/2014  . Benign essential HTN 03/10/2014  . Abnormal EKG 03/10/2014  . GERD (gastroesophageal reflux disease) 07/07/2013  . Granulomatous lung disease 07/07/2013  . Cough 06/13/2013    Jeanna Giuffre, PT 03/08/2015, 10:17 AM  [facility] [address] [place], [place], [address] Phone: [phone]   Fax:  [phone]  Name: Susan Marks MRN: [id number] Date of Birth: 25-Feb-1926

## 2015-03-10 ENCOUNTER — Encounter: Payer: Self-pay | Admitting: Physical Therapy

## 2015-03-10 ENCOUNTER — Ambulatory Visit: Payer: PPO | Admitting: Physical Therapy

## 2015-03-10 DIAGNOSIS — M545 Low back pain, unspecified: Secondary | ICD-10-CM

## 2015-03-10 DIAGNOSIS — R262 Difficulty in walking, not elsewhere classified: Secondary | ICD-10-CM

## 2015-03-10 NOTE — Therapy (Signed)
[facility] [address] [place], [place], [address] Phone: [phone]   Fax:  [phone]  Physical Therapy Treatment  Patient Details  Name: Susan Marks MRN: [id number] Date of Birth: [birth year] Referring Provider: Jonathon Jordan, MD  Encounter Date: 03/10/2015      PT End of Session - 03/10/15 0912    Visit Number 9   Number of Visits 10   Date for PT Re-Evaluation 03/30/15   PT Start Time [id number]   PT Stop Time 0935   PT Time Calculation (min) 40 min   Activity Tolerance Patient tolerated treatment well   Behavior During Therapy [facility] for tasks assessed/performed      Past Medical History  Diagnosis Date  . High blood pressure   . High cholesterol   . Chronic headaches     migraines    Past Surgical History  Procedure Laterality Date  . Vesicovaginal fistula closure w/ tah      There were no vitals filed for this visit.  Visit Diagnosis:  Difficulty walking  Midline low back pain without sciatica      Subjective Assessment - 03/10/15 0858    Subjective Pt is very busy with x-mas activities, will look for a hurricane after x-mas.   Pertinent History low back pain/scoliosis, bilateral bursitis   Limitations Walking;Standing   How long can you walk comfortably? walking slower than usual per patient   Patient Stated Goals learn how to use SPC, return to HEP, improve balance with sweeping and taking out the trash   Currently in Pain? Yes   Pain Score 2    Pain Location Hip   Pain Orientation Right   Pain Descriptors / Indicators Aching   Pain Type Chronic pain   Multiple Pain Sites No                         [facility] Adult PT Treatment/Exercise - 03/10/15 0001    Ambulation/Gait   Ambulation/Gait Yes   Gait Comments stop & go, turning head Lt & Rt looking up/down with close S   turning Lt / Rt, sidestepping    Knee/Hip Exercises: Stretches   Active Hamstring Stretch 3  reps;20 seconds;Both   Knee/Hip Exercises: Aerobic   Nustep Level 2x 8 minutes    seat #6, arms #8   Knee/Hip Exercises: Standing   Hip Abduction Stengthening;Both;2 sets;10 reps;Knee straight   Abduction Limitations 3#   Hip Extension Stengthening;2 sets;10 reps   Forward Step Up Both;10 reps;Hand Hold: 2;2 sets   Rebounder 3 directions x26min each    Walking with Sports Cord 15 # forward x 10    Other Standing Knee Exercises Tandem stance 2 x 20sec each leg  in front of mat table and with close Supervision   Ankle Exercises: Seated   Heel Raises 20 reps  on stairs   Toe Raise --                  PT Short Term Goals - 03/08/15 0944    PT SHORT TERM GOAL #1   Title pt will demonstrate proper sequencing with a SPC for community use   Period Weeks   Status On-going  hasnt bought a cane           PT Long Term Goals - 03/08/15 0940    PT LONG TERM GOAL #1   Title pt will report no unsteadiness with getting up to walk  after sitting x1.5 hours   Time 8   Period Weeks   Status On-going   PT LONG TERM GOAL #3   Title pt will report no LOB when taking the garbage out of the trashcan   Time 8   Period Weeks   Status On-going  hasnt had to do it the past couple of weeks               Plan - 03/10/15 0913    Clinical Impression Statement Pt reports her right hip is hurting today and had to take ibuprofen this am. Pt was able for 20 sec in Tandemstance, and able to tolerate all exercise in the clinic well. Pt will continfue to benefit from skilled PT for progression of balance  activities.    Pt will benefit from skilled therapeutic intervention in order to improve on the following deficits Decreased knowledge of precautions;Decreased mobility;Difficulty walking;Decreased balance   Rehab Potential Good   PT Frequency 2x / week   PT Duration 8 weeks   PT Treatment/Interventions ADLs/Self Care Home Management;DME Instruction;Gait training;Functional mobility  training;Therapeutic activities;Therapeutic exercise;Balance training;Neuromuscular re-education;Patient/family education   PT Next Visit Plan continue with balance training and higher level activities, gait training   Consulted and Agree with Plan of Care Patient        Problem List Patient Active Problem List   Diagnosis Date Noted  . Chest pain 03/10/2014  . Benign essential HTN 03/10/2014  . Abnormal EKG 03/10/2014  . GERD (gastroesophageal reflux disease) 07/07/2013  . Granulomatous lung disease 07/07/2013  . Cough 06/13/2013    NAUMANN-HOUEGNIFIO,Giannina Bartolome PTA 03/10/2015, 9:36 AM  [facility] [address], [place], [place], [address] Phone: [phone]   Fax:  [phone]  Name: Susan Marks MRN: [id number] Date of Birth: 09-Aug-1925

## 2015-03-16 ENCOUNTER — Ambulatory Visit: Payer: PPO

## 2015-03-16 DIAGNOSIS — R262 Difficulty in walking, not elsewhere classified: Secondary | ICD-10-CM | POA: Diagnosis not present

## 2015-03-16 NOTE — Therapy (Signed)
[facility] [address] [place], [place], [address] Phone: [phone]   Fax:  [phone]  Physical Therapy Treatment  Patient Details  Name: Susan Marks MRN: [id number] Date of Birth: 1925-10-20 Referring Provider: Jonathon Jordan, MD  Encounter Date: 03/16/2015      PT End of Session - 03/16/15 0929    Visit Number 10   Number of Visits 20   Date for PT Re-Evaluation 03/30/15   PT Start Time 0847   PT Stop Time 0930   PT Time Calculation (min) 43 min   Activity Tolerance Patient tolerated treatment well   Behavior During Therapy [facility] for tasks assessed/performed      Past Medical History  Diagnosis Date  . High blood pressure   . High cholesterol   . Chronic headaches     migraines    Past Surgical History  Procedure Laterality Date  . Vesicovaginal fistula closure w/ tah      There were no vitals filed for this visit.  Visit Diagnosis:  Difficulty walking      Subjective Assessment - 03/16/15 0909    Subjective Pt has been very busy with christmas and has been tired.  Pt has not purchased a cane yet.   Currently in Pain? Yes   Pain Score 2    Pain Location Hip   Pain Orientation Right   Pain Descriptors / Indicators Aching   Pain Type Chronic pain   Pain Onset More than a month ago   Pain Frequency Constant            [facility] PT Assessment - 03/16/15 0001    Assessment   Medical Diagnosis balance training and leg pain   Onset Date/Surgical Date 08/29/14   Precautions   Precautions Fall   Prior Function   Level of Independence Independent   Cognition   Overall Cognitive Status Within Functional Limits for tasks assessed   Observation/Other Assessments   Focus on Therapeutic Outcomes (FOTO)  57% limitaiton   Strength   Overall Strength Comments tested seated: all hip, knee, and ankle 4/5 or greater and equal between sides   Timed Up and Go Test   TUG Normal TUG   Normal  TUG (seconds) 10                     [facility] Adult PT Treatment/Exercise - 03/16/15 0001    Knee/Hip Exercises: Stretches   Active Hamstring Stretch 3 reps;20 seconds;Both   Knee/Hip Exercises: Aerobic   Nustep Level 2x 8 minutes    seat #6, arms #8   Knee/Hip Exercises: Standing   Hip Abduction Stengthening;Both;2 sets;10 reps;Knee straight   Abduction Limitations 3#   Hip Extension Stengthening;2 sets;10 reps   Extension Limitations 3#   Forward Step Up Both;10 reps;Hand Hold: 2;2 sets   Rebounder 3 directions x74min each    Walking with Sports Cord 15 # forward x 10    Other Standing Knee Exercises Tandem stance 2 x 20sec each leg  in front of mat table and with close Supervision                  PT Short Term Goals - 03/08/15 0944    PT SHORT TERM GOAL #1   Title pt will demonstrate proper sequencing with a SPC for community use   Period Weeks   Status On-going  hasnt bought a cane  PT Long Term Goals - Mar 25, 2015 0905    PT LONG TERM GOAL #1   Title pt will report no unsteadiness with getting up to walk after sitting x1.5 hours   Time 8   Period Weeks   Status On-going   PT LONG TERM GOAL #2   Title pt will improve BERG balance score to >49/56   Time 8   Period Weeks   PT LONG TERM GOAL #3   Title pt will report no LOB when taking the garbage out of the trashcan   Time 8   Period Weeks   Status On-going               Plan - 2015-03-25 0907    Clinical Impression Statement Pt was "feeling off" last week and feels that she didn't have any changes in her balance due to this.  Pt is able to perform TUG in 10 seconds.  FOTO score is 57% limitaiton due to chronic LBP.  Pt has not gotten a cane yet.  Pt will continue to benefit from skilled PT for balance and gait training to improve safety and functional core strength to reduce LBP in standing.     Pt will benefit from skilled therapeutic intervention in order to improve on the  following deficits Decreased knowledge of precautions;Decreased mobility;Difficulty walking;Decreased balance   Rehab Potential Good   PT Frequency 2x / week   PT Duration 8 weeks   PT Treatment/Interventions ADLs/Self Care Home Management;DME Instruction;Gait training;Functional mobility training;Therapeutic activities;Therapeutic exercise;Balance training;Neuromuscular re-education;Patient/family education   PT Next Visit Plan balance, gait and LE strength/endurance          G-Codes - 2015/03/25 0916    Functional Assessment Tool Used FOTO: 57%, clinical judgement and TUG 10 seconds   Functional Limitation Mobility: Walking and moving around   Mobility: Walking and Moving Around Current Status [id number]) At least 40 percent but less than 60 percent impaired, limited or restricted   Mobility: Walking and Moving Around Goal Status [phone]) At least 20 percent but less than 40 percent impaired, limited or restricted      Problem List Patient Active Problem List   Diagnosis Date Noted  . Chest pain 03/10/2014  . Benign essential HTN 03/10/2014  . Abnormal EKG 03/10/2014  . GERD (gastroesophageal reflux disease) 07/07/2013  . Granulomatous lung disease 07/07/2013  . Cough 06/13/2013  Physical Therapy Progress Note  Dates of Reporting Period: 02/02/15 to 03/25/15  Objective Reports of Subjective Statement: Pt reports that she feels more steady.  Progress is slow.   Objective Measurements: See above for TUG and FOTO measures.    Goal Update: See above for goal status.    Plan: LE strength, gait and balance training.    Reason Skilled Services are Required: Pt will continue to benefit from skilled PT for balance and gait training to improve stability and safety at home and in the community.    TAKACS,KELLY,PT Mar 25, 2015, 9:31 AM  [facility] [address] [place], [place], [address] Phone: [phone]   Fax:   [phone]  Name: Susan Marks MRN: [id number] Date of Birth: November 08, 1925

## 2015-03-18 ENCOUNTER — Encounter: Payer: 59 | Admitting: Physical Therapy

## 2015-03-23 ENCOUNTER — Ambulatory Visit: Payer: PPO | Attending: Family Medicine

## 2015-03-23 DIAGNOSIS — R262 Difficulty in walking, not elsewhere classified: Secondary | ICD-10-CM | POA: Diagnosis not present

## 2015-03-23 DIAGNOSIS — M545 Low back pain: Secondary | ICD-10-CM | POA: Insufficient documentation

## 2015-03-23 NOTE — Therapy (Signed)
[facility] [address] [place], [place], [address] Phone: [phone]   Fax:  [phone]  Physical Therapy Treatment  Patient Details  Name: KARE NETH MRN: [id number] Date of Birth: 1926/01/09 Referring Provider: Jonathon Jordan, MD  Encounter Date: 03/23/2015      PT End of Session - 03/23/15 0925    Visit Number 11   Number of Visits 20   Date for PT Re-Evaluation 03/30/15   PT Start Time [id number]   PT Stop Time 0927   PT Time Calculation (min) 40 min   Activity Tolerance Patient tolerated treatment well   Behavior During Therapy [facility] for tasks assessed/performed      Past Medical History  Diagnosis Date  . High blood pressure   . High cholesterol   . Chronic headaches     migraines    Past Surgical History  Procedure Laterality Date  . Vesicovaginal fistula closure w/ tah      There were no vitals filed for this visit.  Visit Diagnosis:  Difficulty walking      Subjective Assessment - 03/23/15 0900    Subjective Pt reports that she has not been losing her balance when changing directions anymore.     Currently in Pain? Yes   Pain Score 2    Pain Location Hip   Pain Orientation Right   Pain Descriptors / Indicators Aching   Pain Type Chronic pain   Pain Onset More than a month ago   Pain Frequency Constant   Aggravating Factors  standing and walking   Pain Relieving Factors sitting down                         [facility] Adult PT Treatment/Exercise - 03/23/15 0001    Knee/Hip Exercises: Stretches   Active Hamstring Stretch 3 reps;20 seconds;Both   Knee/Hip Exercises: Aerobic   Nustep Level 2x 8 minutes    seat #6, arms #8   Knee/Hip Exercises: Standing   Hip Abduction Stengthening;Both;2 sets;10 reps;Knee straight   Abduction Limitations 3#   Hip Extension Stengthening;2 sets;10 reps   Extension Limitations 3#   Rebounder 3 directions x48min each   pain with this  today in the Rt hip   Walking with Sports Cord --   Other Standing Knee Exercises static standing on blue AirEx pad 3x20 seconds   Ankle Exercises: Standing   Rocker Board 3 minutes                  PT Short Term Goals - 03/08/15 0944    PT SHORT TERM GOAL #1   Title pt will demonstrate proper sequencing with a SPC for community use   Period Weeks   Status On-going  hasnt bought a cane           PT Long Term Goals - 03/23/15 0859    PT LONG TERM GOAL #1   Title pt will report no unsteadiness with getting up to walk after sitting x1.5 hours   Time 8   Period Weeks   Status On-going   PT LONG TERM GOAL #3   Title pt will report no LOB when taking the garbage out of the trashcan   Time 8   Period Weeks   Status On-going               Plan - 03/23/15 0903    Clinical Impression Statement Pt reports that she has not  been losing her balance as much with change of directions.  Pt performed TUG in 10 seconds and FOTO score is 57% limitation due to chronic LBP. Pt will continue to benefit from skilled PT for LE strength, endurance and balance training to improve safety at home and community.     Pt will benefit from skilled therapeutic intervention in order to improve on the following deficits Decreased knowledge of precautions;Decreased mobility;Difficulty walking;Decreased balance   Rehab Potential Good   PT Frequency 2x / week   PT Duration 8 weeks   PT Treatment/Interventions ADLs/Self Care Home Management;DME Instruction;Gait training;Functional mobility training;Therapeutic activities;Therapeutic exercise;Balance training;Neuromuscular re-education;Patient/family education   PT Next Visit Plan balance, gait and LE strength/endurance   Consulted and Agree with Plan of Care Patient        Problem List Patient Active Problem List   Diagnosis Date Noted  . Chest pain 03/10/2014  . Benign essential HTN 03/10/2014  . Abnormal EKG 03/10/2014  . GERD  (gastroesophageal reflux disease) 07/07/2013  . Granulomatous lung disease 07/07/2013  . Cough 06/13/2013    Kolt Mcwhirter, PT 03/23/2015, 9:26 AM  [facility] [address], [place], [place], [address] Phone: [phone]   Fax:  [phone]  Name: RANDILEE RECH MRN: [id number] Date of Birth: 1925/08/26

## 2015-03-25 ENCOUNTER — Ambulatory Visit: Payer: PPO | Admitting: Physical Therapy

## 2015-03-25 ENCOUNTER — Encounter: Payer: Self-pay | Admitting: Physical Therapy

## 2015-03-25 DIAGNOSIS — R262 Difficulty in walking, not elsewhere classified: Secondary | ICD-10-CM

## 2015-03-25 DIAGNOSIS — M545 Low back pain, unspecified: Secondary | ICD-10-CM

## 2015-03-25 NOTE — Therapy (Signed)
[facility] [address] [place], [place], [address] Phone: [phone]   Fax:  [phone]  Physical Therapy Treatment  Patient Details  Name: Susan Marks Referring Provider: Jonathon Jordan, MD  Encounter Date: 03/25/2015      PT End of Session - 03/25/15 0856    Visit Number 12   Number of Visits 20   Date for PT Re-Evaluation 03/30/15   PT Start Time 0850   PT Stop Time 0933   PT Time Calculation (min) 43 min   Activity Tolerance Patient tolerated treatment well   Behavior During Therapy [facility] for tasks assessed/performed      Past Medical History  Diagnosis Date  . High blood pressure   . High cholesterol   . Chronic headaches     migraines    Past Surgical History  Procedure Laterality Date  . Vesicovaginal fistula closure w/ tah      There were no vitals filed for this visit.  Visit Diagnosis:  Difficulty walking  Midline low back pain without sciatica      Subjective Assessment - 03/25/15 0858    Subjective Pt reports she was out with friends yesterday, and had no limitations with her balance, however, she ordered a "Hurrycane" online what should arrive soon. Pt complains of neck pain today not related to treatment diagnosis.    Pertinent History low back pain/scoliosis, bilateral bursitis   Limitations Walking;Standing   How long can you walk comfortably? walking slower than usual per patient   Patient Stated Goals learn how to use SPC, return to HEP, improve balance with sweeping and taking out the trash   Currently in Pain? Yes   Pain Score 2    Pain Location Hip   Pain Orientation Right;Left   Pain Descriptors / Indicators Aching   Pain Type Chronic pain   Pain Onset More than a month ago   Pain Frequency Constant   Aggravating Factors  staanding and walking   Pain Relieving Factors sitting down   Multiple Pain Sites No                          [facility] Adult PT Treatment/Exercise - 03/25/15 0001    Knee/Hip Exercises: Stretches   Active Hamstring Stretch 3 reps;20 seconds;Both  on stairs   Knee/Hip Exercises: Aerobic   Nustep Level 2x 8 minutes    seat#6, arms#8, o.51mph   Knee/Hip Exercises: Standing   Hip Abduction Stengthening;Both;1 set;10 reps  unable to perform 2nd set today   Abduction Limitations 3#   Hip Extension Stengthening;1 set;10 reps;Knee straight  unable to perform 2nd set today, not having a good day   Extension Limitations 3#   Forward Step Up Both;10 reps;Hand Hold: 2;2 sets   Rebounder 3 directions x73min each    Other Standing Knee Exercises static standing on blue AirEx pad 3x20 seconds  pt reports feeling more steady than last time   Knee/Hip Exercises: Seated   Abduction/Adduction  Strengthening;Both;2 sets;10 reps  ADD with blue ball   Ankle Exercises: Standing   Rocker Board 3 minutes                  PT Short Term Goals - 03/25/15 [id number]    PT SHORT TERM GOAL #1   Title pt will demonstrate proper sequencing with a SPC for community use   Time 2   Period Weeks  Status On-going           PT Long Term Goals - 03/25/15 [id number]    PT LONG TERM GOAL #1   Title pt will report no unsteadiness with getting up to walk after sitting x1.5 hours   Time 8   Period Weeks   Status On-going   PT LONG TERM GOAL #2   Title pt will improve BERG balance score to >49/56   Time 8   Period Weeks   Status On-going   PT LONG TERM GOAL #3   Title pt will report no LOB when taking the garbage out of the trashcan   Time 8   Period Weeks   Status Achieved               Plan - 03/25/15 0856    Clinical Impression Statement Pt reports her balance seems to improve, was out with friends yesterday and had no LOB. Pt today limited in activity level due to overall arthritis pain. Pt will continue to benefit from skilled PT for LE strength, endurance and  balance training to improve safety at home and community.   Pt will benefit from skilled therapeutic intervention in order to improve on the following deficits Decreased knowledge of precautions;Decreased mobility;Difficulty walking;Decreased balance   Rehab Potential Good   PT Frequency 2x / week   PT Duration 8 weeks   PT Treatment/Interventions ADLs/Self Care Home Management;DME Instruction;Gait training;Functional mobility training;Therapeutic activities;Therapeutic exercise;Balance training;Neuromuscular re-education;Patient/family education   PT Next Visit Plan Re-newal to continue with balance, gait and LE strength/endurance   Consulted and Agree with Plan of Care Patient        Problem List Patient Active Problem List   Diagnosis Date Noted  . Chest pain 03/10/2014  . Benign essential HTN 03/10/2014  . Abnormal EKG 03/10/2014  . GERD (gastroesophageal reflux disease) 07/07/2013  . Granulomatous lung disease 07/07/2013  . Cough 06/13/2013    Susan Marks,Susan Marks PTA 03/25/2015, 9:35 AM  [facility] [address], [place], [place], [address] Phone: [phone]   Fax:  [phone]  Name: Susan Marks MRN: [id number] Date of Birth: Sep 12, 1925

## 2015-03-29 ENCOUNTER — Ambulatory Visit: Payer: 59

## 2015-04-02 ENCOUNTER — Encounter: Payer: 59 | Admitting: Physical Therapy

## 2015-04-03 ENCOUNTER — Other Ambulatory Visit: Payer: Self-pay | Admitting: Internal Medicine

## 2015-04-06 ENCOUNTER — Ambulatory Visit: Payer: PPO | Admitting: Physical Therapy

## 2015-04-06 ENCOUNTER — Encounter: Payer: Self-pay | Admitting: Physical Therapy

## 2015-04-06 DIAGNOSIS — M545 Low back pain, unspecified: Secondary | ICD-10-CM

## 2015-04-06 DIAGNOSIS — R262 Difficulty in walking, not elsewhere classified: Secondary | ICD-10-CM

## 2015-04-06 NOTE — Addendum Note (Signed)
Addended by: Alvera Singh on: 04/06/2015 05:10 PM   Modules accepted: Orders

## 2015-04-06 NOTE — Therapy (Addendum)
[facility] [address] [place], [place], [address] Phone: [phone]   Fax:  [phone]  Physical Therapy Treatment/Recertification  Patient Details  Name: Susan Marks MRN: [id number] Date of Birth: 02-12-1926 Referring Provider: Jonathon Jordan, MD  Encounter Date: 04/06/2015      PT End of Session - 04/06/15 1508    Visit Number 13   Number of Visits 20   Date for PT Re-Evaluation 04/23/15   PT Start Time 1450   PT Stop Time 1530   PT Time Calculation (min) 40 min   Activity Tolerance Patient tolerated treatment well   Behavior During Therapy [facility] for tasks assessed/performed      Past Medical History  Diagnosis Date  . High blood pressure   . High cholesterol   . Chronic headaches     migraines    Past Surgical History  Procedure Laterality Date  . Vesicovaginal fistula closure w/ tah      There were no vitals filed for this visit.  Visit Diagnosis:  Difficulty walking  Midline low back pain without sciatica      Subjective Assessment - 04/06/15 1459    Subjective Pt reports today back pain rated as 6/10. She arrived with a new cane and questions if this will be a good choice for her.    Pertinent History low back pain/scoliosis, bilateral bursitis   How long can you walk comfortably? walking slower than usual per patient   Patient Stated Goals learn how to use SPC, return to HEP, improve balance with sweeping and taking out the trash   Currently in Pain? Yes   Pain Score 5    Pain Location Hip   Pain Orientation Right   Pain Descriptors / Indicators Aching   Pain Type Chronic pain   Pain Onset More than a month ago   Pain Frequency Constant   Aggravating Factors  standidng and walking   Pain Relieving Factors sitting down   Multiple Pain Sites No            [facility] PT Assessment - 04/06/15 0001    Assessment   Medical Diagnosis balance training and leg pain   Onset  Date/Surgical Date 08/29/14   Precautions   Precautions Fall   Prior Function   Level of Independence Independent   Cognition   Overall Cognitive Status Within Functional Limits for tasks assessed   Observation/Other Assessments   Focus on Therapeutic Outcomes (FOTO)  57% limitaiton   Strength   Overall Strength Comments tested seated: all hip, knee, and ankle 4/5 or greater and equal between sides   Ambulation/Gait   Ambulation/Gait Yes   Ambulation/Gait Assistance 6: Modified independent (Device/Increase time)   Assistive device Lofstrands   Gait Pattern Step-through pattern   Ambulation Surface Level   Timed Up and Go Test   TUG Normal TUG   Normal TUG (seconds) 10                     [facility] Adult PT Treatment/Exercise - 04/06/15 0001    Knee/Hip Exercises: Stretches   Active Hamstring Stretch 3 reps;20 seconds;Both  in sitting   Knee/Hip Exercises: Aerobic   Nustep Level 1x 10 minutes    seat #6, arms # 8   Knee/Hip Exercises: Standing   Rebounder 3 directions x21mn each    Other Standing Knee Exercises standing with ball reaching out of base of support   Other Standing Knee Exercises static standing  on blue AirEx pad 3x20 seconds   Ankle Exercises: Standing   Rocker Board 3 minutes                  PT Short Term Goals - 04/06/15 1513    PT SHORT TERM GOAL #1   Title pt will demonstrate proper sequencing with a SPC for community use   Time 2   Period Weeks   Status Partially Met           PT Long Term Goals - 03/25/15 0165    PT LONG TERM GOAL #1   Title pt will report no unsteadiness with getting up to walk after sitting x1.5 hours   Time 8   Period Weeks   Status On-going   PT LONG TERM GOAL #2   Title pt will improve BERG balance score to >49/56   Time 8   Period Weeks   Status On-going   PT LONG TERM GOAL #3   Title pt will report no LOB when taking the garbage out of the trashcan   Time 8   Period Weeks   Status Achieved                Plan - 04/06/15 1509    Clinical Impression Statement Pt reports her right hip and low back is hurting today. Pt will continue to benefit from skilled PT for LE strength, endurance and balance training to improve safety and ambulation   Pt will benefit from skilled therapeutic intervention in order to improve on the following deficits Decreased knowledge of precautions;Decreased mobility;Difficulty walking;Decreased balance   Rehab Potential Good   PT Frequency 2x / week   PT Duration 8 weeks   PT Treatment/Interventions ADLs/Self Care Home Management;DME Instruction;Gait training;Functional mobility training;Therapeutic activities;Therapeutic exercise;Balance training;Neuromuscular re-education;Patient/family education   PT Next Visit Plan Re-newal to continue with balance, gait and LE strength/endurance   Consulted and Agree with Plan of Care Patient        Problem List Patient Active Problem List   Diagnosis Date Noted  . Chest pain 03/10/2014  . Benign essential HTN 03/10/2014  . Abnormal EKG 03/10/2014  . GERD (gastroesophageal reflux disease) 07/07/2013  . Granulomatous lung disease 07/07/2013  . Cough 06/13/2013    NAUMANN-HOUEGNIFIO,Liliane Mallis PTA 04/06/2015, 3:43 PM  [facility] [address], [place], [place], [address] Phone: [phone]   Fax:  [phone]  Name: Susan Marks MRN: [id number] Date of Birth: May 22, 1925  Ruben Im, PT 04/06/2015 5:10 PM Phone: [phone] Fax: [phone]

## 2015-04-08 ENCOUNTER — Ambulatory Visit: Payer: PPO | Admitting: Physical Therapy

## 2015-04-08 ENCOUNTER — Encounter: Payer: Self-pay | Admitting: Physical Therapy

## 2015-04-08 DIAGNOSIS — M545 Low back pain, unspecified: Secondary | ICD-10-CM

## 2015-04-08 DIAGNOSIS — R262 Difficulty in walking, not elsewhere classified: Secondary | ICD-10-CM | POA: Diagnosis not present

## 2015-04-08 NOTE — Therapy (Signed)
[facility] [address], [place], [place], [address] Phone: [phone]   Fax:  [phone]  Physical Therapy Treatment  Patient Details  Name: Susan Marks MRN: [id number] Date of Birth: [birth year] Referring Provider: Jonathon Jordan, MD  Encounter Date: 04/08/2015      PT End of Session - 04/08/15 0957    Visit Number 14   Number of Visits 20   Date for PT Re-Evaluation 04/23/15   PT Start Time 0936   PT Stop Time 1017   PT Time Calculation (min) 41 min   Activity Tolerance Patient tolerated treatment well   Behavior During Therapy [facility] for tasks assessed/performed      Past Medical History  Diagnosis Date  . High blood pressure   . High cholesterol   . Chronic headaches     migraines    Past Surgical History  Procedure Laterality Date  . Vesicovaginal fistula closure w/ tah      There were no vitals filed for this visit.  Visit Diagnosis:  Difficulty walking  Midline low back pain without sciatica      Subjective Assessment - 04/08/15 0945    Subjective No complain of back pain, but discomfort in right hip rated as 3/10. Pt brought her new cane in to have cane tip attached, however, unable to remove the original part.       Pertinent History low back pain/scoliosis, bilateral bursitis   Limitations Walking;Standing   How long can you walk comfortably? walking slower than usual per patient   Patient Stated Goals learn how to use SPC, return to HEP, improve balance with sweeping and taking out the trash   Currently in Pain? Yes   Pain Score 3    Pain Location Hip   Pain Orientation Right   Pain Descriptors / Indicators Aching   Pain Type Chronic pain   Pain Onset More than a month ago   Pain Frequency Constant   Aggravating Factors  standing and walking   Pain Relieving Factors sitting down   Multiple Pain Sites No                         [facility] Adult PT  Treatment/Exercise - 04/08/15 0001    Ambulation/Gait   Ambulation/Gait Yes   Ambulation/Gait Assistance 6: Modified independent (Device/Increase time)   Assistive device Lofstrands   Gait Pattern Step-through pattern   Ambulation Surface Level   Knee/Hip Exercises: Stretches   Active Hamstring Stretch 3 reps;20 seconds;Both  in sitting   Knee/Hip Exercises: Aerobic   Nustep Level 2x 10 minutes    Pt able to increase to 2 again today   Knee/Hip Exercises: Standing   Hip Abduction Stengthening;Both;1 set;10 reps   Abduction Limitations 3#   Hip Extension Stengthening;1 set;10 reps;Knee straight   Extension Limitations 3#   Forward Step Up Both;10 reps;Hand Hold: 2;2 sets   Rebounder 3 directions x35mn each    Other Standing Knee Exercises standing with ball reaching out of base of support   Other Standing Knee Exercises --   Ankle Exercises: Standing   Rocker Board 3 minutes                  PT Short Term Goals - 04/06/15 1513    PT SHORT TERM GOAL #1   Title pt will demonstrate proper sequencing with a SPC for community use   Time 2   Period Weeks  Status Partially Met           PT Long Term Goals - 03/25/15 1610    PT LONG TERM GOAL #1   Title pt will report no unsteadiness with getting up to walk after sitting x1.5 hours   Time 8   Period Weeks   Status On-going   PT LONG TERM GOAL #2   Title pt will improve BERG balance score to >49/56   Time 8   Period Weeks   Status On-going   PT LONG TERM GOAL #3   Title pt will report no LOB when taking the garbage out of the trashcan   Time 8   Period Weeks   Status Achieved               Plan - 04/08/15 0959    Clinical Impression Statement Pt with improved activity tolerance today and able to perform standing exercises with added weight. Pt will continue to improve with strength, proprioception and balance    Pt will benefit from skilled therapeutic intervention in order to improve on the  following deficits Decreased knowledge of precautions;Decreased mobility;Difficulty walking;Decreased balance   Rehab Potential Good   PT Frequency 2x / week   PT Duration 8 weeks   PT Treatment/Interventions ADLs/Self Care Home Management;DME Instruction;Gait training;Functional mobility training;Therapeutic activities;Therapeutic exercise;Balance training;Neuromuscular re-education;Patient/family education   PT Next Visit Plan Continue with balance, gait and LE strength/endurance   Consulted and Agree with Plan of Care Patient        Problem List Patient Active Problem List   Diagnosis Date Noted  . Chest pain 03/10/2014  . Benign essential HTN 03/10/2014  . Abnormal EKG 03/10/2014  . GERD (gastroesophageal reflux disease) 07/07/2013  . Granulomatous lung disease 07/07/2013  . Cough 06/13/2013    NAUMANN-HOUEGNIFIO,Susan Marks PTA 04/08/2015, 10:16 AM  [facility] [address], [place], [place], [address] Phone: [phone]   Fax:  [phone]  Name: Susan Marks MRN: [id number] Date of Birth: November 16, 1925

## 2015-04-15 ENCOUNTER — Ambulatory Visit: Payer: PPO | Admitting: Physical Therapy

## 2015-04-15 ENCOUNTER — Encounter: Payer: Self-pay | Admitting: Physical Therapy

## 2015-04-15 DIAGNOSIS — R262 Difficulty in walking, not elsewhere classified: Secondary | ICD-10-CM

## 2015-04-15 DIAGNOSIS — M545 Low back pain, unspecified: Secondary | ICD-10-CM

## 2015-04-15 NOTE — Therapy (Signed)
[facility] [address] [place], [place], [address] Phone: [phone]   Fax:  [phone]  Physical Therapy Treatment  Patient Details  Name: Susan Marks MRN: [id number] Date of Birth: Aug 07, 1925 Referring Provider: Jonathon Jordan, MD  Encounter Date: 04/15/2015      PT End of Session - 04/15/15 0943    Visit Number 15   Number of Visits 20   Date for PT Re-Evaluation 04/23/15   PT Start Time 0933   PT Stop Time 1015   PT Time Calculation (min) 42 min   Activity Tolerance Patient tolerated treatment well      Past Medical History  Diagnosis Date  . High blood pressure   . High cholesterol   . Chronic headaches     migraines    Past Surgical History  Procedure Laterality Date  . Vesicovaginal fistula closure w/ tah      There were no vitals filed for this visit.  Visit Diagnosis:  Difficulty walking  Midline low back pain without sciatica      Subjective Assessment - 04/15/15 0941    Subjective Feeling good today, patient with improved awarness with ambulation, no complain of back pain, but discomfort in right hip rated as 3/10.     Pertinent History low back pain/scoliosis, bilateral bursitis   Limitations Walking;Standing   How long can you walk comfortably? walking slower than usual per patient   Patient Stated Goals learn how to use SPC, return to HEP, improve balance with sweeping and taking out the trash   Currently in Pain? Yes   Pain Score 2    Pain Location Hip   Pain Orientation Right   Pain Descriptors / Indicators Aching   Pain Type Chronic pain   Pain Onset More than a month ago   Pain Frequency Constant   Aggravating Factors  standing and walking   Pain Relieving Factors sitting down   Multiple Pain Sites No                         [facility] Adult PT Treatment/Exercise - 04/15/15 0001    Standardized Balance Assessment   Standardized Balance Assessment  Berg Balance Test   Berg Balance Test   Sit to Stand Able to stand without using hands and stabilize independently   Standing Unsupported Able to stand safely 2 minutes   Sitting with Back Unsupported but Feet Supported on Floor or Stool Able to sit safely and securely 2 minutes   Stand to Sit Sits safely with minimal use of hands   Transfers Able to transfer safely, minor use of hands   Standing Unsupported with Eyes Closed Able to stand 10 seconds safely   Standing Ubsupported with Feet Together Able to place feet together independently and stand 1 minute safely   From Standing, Reach Forward with Outstretched Arm Can reach forward >12 cm safely (5")   From Standing Position, Pick up Object from Floor Able to pick up shoe safely and easily   From Standing Position, Turn to Look Behind Over each Shoulder Looks behind from both sides and weight shifts well   Turn 360 Degrees Able to turn 360 degrees safely in 4 seconds or less   Standing Unsupported, Alternately Place Feet on Step/Stool Able to stand independently and safely and complete 8 steps in 20 seconds   Standing Unsupported, One Foot in Front Able to plae foot ahead of the other independently and  hold 30 seconds   Standing on One Leg Able to lift leg independently and hold equal to or more than 3 seconds   Total Score 52   Knee/Hip Exercises: Stretches   Active Hamstring Stretch 3 reps;20 seconds;Both  in sitting   Knee/Hip Exercises: Aerobic   Nustep Level 2x 10 minutes    seat#6, arms# 8   Knee/Hip Exercises: Standing   Hip Abduction Stengthening;Both;1 set;10 reps   Abduction Limitations 3#   Hip Extension Stengthening;1 set;10 reps;Knee straight   Extension Limitations 3#   Forward Step Up Both;10 reps;Hand Hold: 2;2 sets  3#   Rebounder 3 directions x54mn each    Other Standing Knee Exercises standing with ball reaching out of base of support   Ankle Exercises: Standing   Rocker Board --                  PT  Short Term Goals - 04/15/15 0947    PT SHORT TERM GOAL #1   Title pt will demonstrate proper sequencing with a SPC for community use   Time 2   Period Weeks   Status Partially Met           PT Long Term Goals - 04/15/15 0949    PT LONG TERM GOAL #1   Title pt will report no unsteadiness with getting up to walk after sitting x1.5 hours   Time 8   Period Weeks   Status On-going   PT LONG TERM GOAL #2   Title pt will improve BERG balance score to >49/56  52/56 as of Apr 15, 2015   Time 8   Period Weeks   Status Achieved   PT LONG TERM GOAL #3   Title pt will report no LOB when taking the garbage out of the trashcan   Time 8   Period Weeks   Status Achieved               Plan - 04/15/15 0944    Clinical Impression Statement Pt reports she does not feel comfortable walking without a cane, she will have cane fixed for bottom part, so she will be able to use it.    Pt will benefit from skilled therapeutic intervention in order to improve on the following deficits Decreased knowledge of precautions;Decreased mobility;Difficulty walking;Decreased balance   Rehab Potential Good   PT Frequency 2x / week   PT Duration 8 weeks   PT Treatment/Interventions ADLs/Self Care Home Management;DME Instruction;Gait training;Functional mobility training;Therapeutic activities;Therapeutic exercise;Balance training;Neuromuscular re-education;Patient/family education   PT Next Visit Plan FOTO and D/C next week   Consulted and Agree with Plan of Care Patient        Problem List Patient Active Problem List   Diagnosis Date Noted  . Chest pain 03/10/2014  . Benign essential HTN 03/10/2014  . Abnormal EKG 03/10/2014  . GERD (gastroesophageal reflux disease) 07/07/2013  . Granulomatous lung disease 07/07/2013  . Cough 06/13/2013    NAUMANN-HOUEGNIFIO,Susan Marks 04/15/2015, 1:06 PM   [facility] [address] [place] [address]Phone: [phone]  Fax:  [phone] Name: Susan CONNELLYMRN: [id number] of Birth: 502-Sep-1927

## 2015-04-20 ENCOUNTER — Ambulatory Visit: Payer: PPO

## 2015-04-20 DIAGNOSIS — R262 Difficulty in walking, not elsewhere classified: Secondary | ICD-10-CM | POA: Diagnosis not present

## 2015-04-20 DIAGNOSIS — M545 Low back pain, unspecified: Secondary | ICD-10-CM

## 2015-04-20 NOTE — Therapy (Addendum)
[facility] [address] [place], [place], [address] Phone: [phone]   Fax:  [phone]  Physical Therapy Treatment  Patient Details  Name: Susan Marks MRN: [id number] Date of Birth: 12/28/1925 Referring Provider: Jonathon Jordan, MD  Encounter Date: 04/20/2015      PT End of Session - 04/20/15 1007    Visit Number 16   PT Start Time 0940   PT Stop Time 1018   PT Time Calculation (min) 38 min   Activity Tolerance Patient tolerated treatment well   Behavior During Therapy [facility] for tasks assessed/performed      Past Medical History  Diagnosis Date  . High blood pressure   . High cholesterol   . Chronic headaches     migraines    Past Surgical History  Procedure Laterality Date  . Vesicovaginal fistula closure w/ tah      There were no vitals filed for this visit.  Visit Diagnosis:  Difficulty walking  Midline low back pain without sciatica      Subjective Assessment - 04/20/15 0945    Subjective Ready for D/C.  Will continue to work on balance and HEP at home.     Currently in Pain? Yes   Pain Score 2    Pain Location Hip   Pain Orientation Right   Pain Descriptors / Indicators Aching   Pain Type Chronic pain   Pain Onset More than a month ago   Pain Frequency Constant   Aggravating Factors  standing and walking   Pain Relieving Factors sitting down            [facility] PT Assessment - 04/20/15 0001    Assessment   Medical Diagnosis balance training and leg pain   Onset Date/Surgical Date 08/29/14   Precautions   Precautions Fall   Prior Function   Level of Independence Independent   Cognition   Overall Cognitive Status Within Functional Limits for tasks assessed   Observation/Other Assessments   Focus on Therapeutic Outcomes (FOTO)  53% limitation                     [facility] Adult PT Treatment/Exercise - 04/20/15 0001    Berg Balance Test   Sit to Stand Able to  stand without using hands and stabilize independently   Standing Unsupported Able to stand safely 2 minutes   Sitting with Back Unsupported but Feet Supported on Floor or Stool Able to sit safely and securely 2 minutes   Stand to Sit Sits safely with minimal use of hands   Transfers Able to transfer safely, minor use of hands   Standing Unsupported with Eyes Closed Able to stand 10 seconds safely   Standing Ubsupported with Feet Together Able to place feet together independently and stand 1 minute safely   From Standing, Reach Forward with Outstretched Arm Can reach forward >12 cm safely (5")   From Standing Position, Pick up Object from Floor Able to pick up shoe safely and easily   From Standing Position, Turn to Look Behind Over each Shoulder Looks behind from both sides and weight shifts well   Turn 360 Degrees Able to turn 360 degrees safely in 4 seconds or less   Standing Unsupported, Alternately Place Feet on Step/Stool Able to stand independently and safely and complete 8 steps in 20 seconds   Standing Unsupported, One Foot in Front Able to plae foot ahead of the other independently and hold 30 seconds  Standing on One Leg Able to lift leg independently and hold equal to or more than 3 seconds   Total Score 52   Knee/Hip Exercises: Stretches   Active Hamstring Stretch 3 reps;20 seconds;Both  in sitting   Knee/Hip Exercises: Aerobic   Nustep Level 2x 10 minutes    seat#6, arms# 8   Knee/Hip Exercises: Standing   Hip Abduction Stengthening;Both;10 reps;2 sets   Abduction Limitations 3#   Hip Extension Stengthening;10 reps;Knee straight;2 sets   Extension Limitations 3#   Forward Step Up Both;10 reps;Hand Hold: 2;2 sets  3#   Rebounder 3 directions x65mn each    Ankle Exercises: Standing   Rocker Board 3 minutes                PT Education - [id number]    Education provided Yes   Education Details hamstring stretch   Person(s) Educated Patient   Methods  Explanation;Demonstration;Handout   Comprehension Verbalized understanding;Returned demonstration          PT Short Term Goals - 02017/02/060945    PT SHORT TERM GOAL #1   Title pt will demonstrate proper sequencing with a SPC for community use   Status Achieved           PT Long Term Goals - 0Feb 06, [id number]    PT LONG TERM GOAL #1   Title pt will report no unsteadiness with getting up to walk after sitting x1.5 hours   Status Partially Met   PT LONG TERM GOAL #2   Title pt will improve BERG balance score to >49/56   Status Achieved  52/56   PT LONG TERM GOAL #3   Title pt will report no LOB when taking the garbage out of the trashcan   Status Achieved               Plan - 0Feb 06, 20170949    Clinical Impression Statement Pt with Berg Score 52/56 and FOTO 53% limitation.  Pt reports 50% improved confidence with balance.   Pt has HEP in place and will continue with HEP after D/C today.  Pt has a cane now and is able to demo proper use and demonstrates improved safety with use.     PT Next Visit Plan D/C PT to HEP   Consulted and Agree with Plan of Care Patient          G-Codes - [id number]    Functional Assessment Tool Used FOTO: 51% limitation, Berg 52/56   Functional Limitation Mobility: Walking and moving around   Mobility: Walking and Moving Around Goal Status [phone] At least 20 percent but less than 40 percent impaired, limited or restricted   Mobility: Walking and Moving Around Discharge Status [phone] At least 20 percent but less than 40 percent impaired, limited or restricted      Problem List Patient Active Problem List   Diagnosis Date Noted  . Chest pain 03/10/2014  . Benign essential HTN 03/10/2014  . Abnormal EKG 03/10/2014  . GERD (gastroesophageal reflux disease) 07/07/2013  . Granulomatous lung disease 07/07/2013  . Cough 06/13/2013   PHYSICAL THERAPY DISCHARGE SUMMARY  Visits from Start of Care: 16  Current functional level related to  goals / functional outcomes: See above for current status.  Pt reports 50% improved confidence with balance since the start of care.     Remaining deficits: Pt with chronic balance deficits that have improved with balance and strength training.  Pt has HEP in place to  address balance deficits and hip/low back pain.     Education / Equipment: HEP  Plan: Patient agrees to discharge.  Patient goals were partially met. Patient is being discharged due to being pleased with the current functional level.  ?????     TAKACS,KELLY, PT 04/20/2015, 10:18 AM  [facility] [address] [place], [place], [address] Phone: [phone]   Fax:  [phone]  Name: Susan Marks MRN: [id number] Date of Birth: 1925-06-01

## 2015-04-20 NOTE — Patient Instructions (Signed)
HIP: Hamstrings - Short Sitting    Rest leg on raised surface. Keep knee straight. Lift chest. Hold _20__ seconds. _3__ reps per set, __3_ sets per day  Copyright  VHI. All rights reserved.  [facility] [address], [address] [place], [place] [address] Phone # [phone] Fax [phone] 

## 2015-10-04 DIAGNOSIS — M7070 Other bursitis of hip, unspecified hip: Secondary | ICD-10-CM | POA: Diagnosis not present

## 2015-10-04 DIAGNOSIS — R198 Other specified symptoms and signs involving the digestive system and abdomen: Secondary | ICD-10-CM | POA: Diagnosis not present

## 2015-10-04 DIAGNOSIS — M545 Low back pain: Secondary | ICD-10-CM | POA: Diagnosis not present

## 2015-10-04 DIAGNOSIS — L299 Pruritus, unspecified: Secondary | ICD-10-CM | POA: Diagnosis not present

## 2015-10-04 DIAGNOSIS — M542 Cervicalgia: Secondary | ICD-10-CM | POA: Diagnosis not present

## 2015-10-04 DIAGNOSIS — R252 Cramp and spasm: Secondary | ICD-10-CM | POA: Diagnosis not present

## 2015-10-25 DIAGNOSIS — M4316 Spondylolisthesis, lumbar region: Secondary | ICD-10-CM | POA: Diagnosis not present

## 2016-01-03 DIAGNOSIS — Z0000 Encounter for general adult medical examination without abnormal findings: Secondary | ICD-10-CM | POA: Diagnosis not present

## 2016-01-03 DIAGNOSIS — E538 Deficiency of other specified B group vitamins: Secondary | ICD-10-CM | POA: Diagnosis not present

## 2016-01-03 DIAGNOSIS — E02 Subclinical iodine-deficiency hypothyroidism: Secondary | ICD-10-CM | POA: Diagnosis not present

## 2016-01-03 DIAGNOSIS — N183 Chronic kidney disease, stage 3 (moderate): Secondary | ICD-10-CM | POA: Diagnosis not present

## 2016-01-03 DIAGNOSIS — R413 Other amnesia: Secondary | ICD-10-CM | POA: Diagnosis not present

## 2016-01-03 DIAGNOSIS — I447 Left bundle-branch block, unspecified: Secondary | ICD-10-CM | POA: Diagnosis not present

## 2016-01-03 DIAGNOSIS — E785 Hyperlipidemia, unspecified: Secondary | ICD-10-CM | POA: Diagnosis not present

## 2016-01-03 DIAGNOSIS — E559 Vitamin D deficiency, unspecified: Secondary | ICD-10-CM | POA: Diagnosis not present

## 2016-01-03 DIAGNOSIS — I10 Essential (primary) hypertension: Secondary | ICD-10-CM | POA: Diagnosis not present

## 2016-01-03 DIAGNOSIS — R109 Unspecified abdominal pain: Secondary | ICD-10-CM | POA: Diagnosis not present

## 2016-01-03 DIAGNOSIS — Z79899 Other long term (current) drug therapy: Secondary | ICD-10-CM | POA: Diagnosis not present

## 2016-01-03 DIAGNOSIS — F325 Major depressive disorder, single episode, in full remission: Secondary | ICD-10-CM | POA: Diagnosis not present

## 2016-01-03 DIAGNOSIS — D692 Other nonthrombocytopenic purpura: Secondary | ICD-10-CM | POA: Diagnosis not present

## 2016-01-10 DIAGNOSIS — E538 Deficiency of other specified B group vitamins: Secondary | ICD-10-CM | POA: Diagnosis not present

## 2016-01-17 DIAGNOSIS — E538 Deficiency of other specified B group vitamins: Secondary | ICD-10-CM | POA: Diagnosis not present

## 2016-01-25 DIAGNOSIS — E538 Deficiency of other specified B group vitamins: Secondary | ICD-10-CM | POA: Diagnosis not present

## 2016-02-01 DIAGNOSIS — M810 Age-related osteoporosis without current pathological fracture: Secondary | ICD-10-CM | POA: Diagnosis not present

## 2016-03-22 DIAGNOSIS — M150 Primary generalized (osteo)arthritis: Secondary | ICD-10-CM | POA: Diagnosis not present

## 2016-03-22 DIAGNOSIS — Z6829 Body mass index (BMI) 29.0-29.9, adult: Secondary | ICD-10-CM | POA: Diagnosis not present

## 2016-03-22 DIAGNOSIS — R5383 Other fatigue: Secondary | ICD-10-CM | POA: Diagnosis not present

## 2016-03-22 DIAGNOSIS — E663 Overweight: Secondary | ICD-10-CM | POA: Diagnosis not present

## 2016-03-22 DIAGNOSIS — M810 Age-related osteoporosis without current pathological fracture: Secondary | ICD-10-CM | POA: Diagnosis not present

## 2016-04-14 DIAGNOSIS — M810 Age-related osteoporosis without current pathological fracture: Secondary | ICD-10-CM | POA: Diagnosis not present

## 2016-05-09 DIAGNOSIS — N760 Acute vaginitis: Secondary | ICD-10-CM | POA: Diagnosis not present

## 2016-08-08 ENCOUNTER — Telehealth: Payer: Self-pay | Admitting: Cardiology

## 2016-08-08 NOTE — Telephone Encounter (Signed)
New message     Pt would like to switch from Dr Radford Pax to Dr Marlou Porch, she use to see Dr Marlou Porch before he joined this practice.

## 2016-08-08 NOTE — Telephone Encounter (Signed)
OK with me Mark Skains, MD  

## 2016-08-08 NOTE — Telephone Encounter (Signed)
Ok with me 

## 2016-08-09 NOTE — Telephone Encounter (Signed)
Gave to schedulers as FYI.

## 2016-08-15 ENCOUNTER — Ambulatory Visit (INDEPENDENT_AMBULATORY_CARE_PROVIDER_SITE_OTHER): Payer: PPO | Admitting: Cardiology

## 2016-08-15 VITALS — BP 140/84 | HR 58 | Ht 59.0 in | Wt 137.4 lb

## 2016-08-15 DIAGNOSIS — I447 Left bundle-branch block, unspecified: Secondary | ICD-10-CM

## 2016-08-15 DIAGNOSIS — E7800 Pure hypercholesterolemia, unspecified: Secondary | ICD-10-CM | POA: Diagnosis not present

## 2016-08-15 DIAGNOSIS — I10 Essential (primary) hypertension: Secondary | ICD-10-CM | POA: Diagnosis not present

## 2016-08-15 NOTE — Progress Notes (Signed)
Cardiology Office Note:    Date:  08/15/2016   ID:  [facility]an [facility], DOB 13-Nov-1925, MRN [id number]  PCP:  Jonathon Jordan, MD  Cardiologist:  Candee Furbish, MD    Referring MD: Jonathon Jordan, MD     History of Present Illness:    Susan Marks is a 83 y.o. female with hypertension, hyperlipidemia, anemia who have seen in the distant past greater than 3 years ago here with follow-up for bundle branch block, PVCs, prior chest pain.  In review of prior office note by Dr. Radford Pax on 03/10/14, she was having chest discomfort with exertion when walking the dog but very brief. She underwent nuclear stress test on 03/19/14 and everything was fine.  Today she states that she's had a couple episodes of chest discomfort sometimes described as a 6/10 at rest, left upper chest sometimes feels as though it goes to her back, lasting a few minutes duration. She had one episode when walking across [employer] in the cold. These are usually brief and rare episodes. No other associated symptoms.  Occasional cramping in her legs. Good overall pulses.   Her sister who is 35 years younger than her had bypass surgery.  Past Medical History:  Diagnosis Date  . Chronic headaches    migraines  . High blood pressure   . High cholesterol     Past Surgical History:  Procedure Laterality Date  . VESICOVAGINAL FISTULA CLOSURE W/ TAH      Current Medications: Current Meds  Medication Sig  . aspirin 81 MG tablet Take 81 mg by mouth daily.  . Azelastine-Fluticasone (DYMISTA) 137-50 MCG/ACT SUSP Place 1-2 sprays into both nostrils at bedtime.  . Calcium-Vitamin D-Vitamin K (VIACTIV PO) Take 2 capsules by mouth daily.  . cholecalciferol (VITAMIN D) 400 units TABS tablet Take 400 Units by mouth.  . cyanocobalamin 2000 MCG tablet Take 2,000 mcg by mouth daily.  Marland Kitchen denosumab (PROLIA) 60 MG/ML SOLN injection Inject 60 mg into the skin every 6 (six) months. Administer in upper arm, thigh, or  abdomen  . diazepam (VALIUM) 2 MG tablet Take 1 mg by mouth as needed for anxiety.  Marland Kitchen esomeprazole (NEXIUM) 40 MG capsule Take 40 mg by mouth daily.   . fenofibrate micronized (LOFIBRA) 200 MG capsule Take 200 mg by mouth at bedtime.  . Fluticasone Furoate-Vilanterol (BREO ELLIPTA) 100-25 MCG/INH AEPB Inhale 1 puff into the lungs daily. RINSE MOUTH WELL AFTER USE  . ibuprofen (ADVIL,MOTRIN) 200 MG tablet Take 200 mg by mouth as needed.  Marland Kitchen losartan (COZAAR) 100 MG tablet TAKE 1 TABLET BY MOUTH EVERY DAY  . polyethylene glycol (MIRALAX / GLYCOLAX) packet Take 17 g by mouth once a week.     Allergies:   Sulfa antibiotics; Lisinopril; and Statins   Social History   Social History  . Marital status: Married    Spouse name: N/A  . Number of children: 2  . Years of education: N/A   Occupational History  . Retired-teacher    Social History Main Topics  . Smoking status: Never Smoker  . Smokeless tobacco: Not on file  . Alcohol use Yes     Comment: occasional wine(red)  . Drug use: No  . Sexual activity: Not on file   Other Topics Concern  . Not on file   Social History Narrative  . No narrative on file     Family History: The patient's family history includes Allergies in her sister; Glaucoma in her sister; Heart disease  in her father; Heart murmur in her sister. ROS:   Please see the history of present illness.     All other systems reviewed and are negative.  EKGs/Labs/Other Studies Reviewed:    The following studies were reviewed today: Prior office notes, lab work, stress test reviewed. Stress test normal.  Echocardiogram 03/11/14:  - Left ventricle: Abnormal septal motion. The cavity size was normal. Systolic function was normal. The estimated ejection fraction was 55%. Wall motion was normal; there were no regional wall motion abnormalities. Doppler parameters are consistent with abnormal left ventricular relaxation (grade 1 diastolic dysfunction). -  Aortic valve: There was trivial regurgitation. - Mitral valve: There was mild regurgitation. - Left atrium: The atrium was mildly dilated. - Atrial septum: No defect or patent foramen ovale was identified.  EKG:  EKG is  ordered today.  The ekg ordered today demonstrates 08/15/16-sinus bradycardia first-degree AV block 212 ms with heart rate of 50 bpm with left bundle branch block. Prior EKG demonstrated left bundle branch block.  Recent Labs: No results found for requested labs within last 8760 hours.   Recent Lipid Panel No results found for: CHOL, TRIG, HDL, CHOLHDL, VLDL, LDLCALC, LDLDIRECT  Physical Exam:    VS:  BP 140/84   Pulse (!) 58   Ht 4\' 11"  (1.499 m)   Wt 137 lb 6.4 oz (62.3 kg)   BMI 27.75 kg/m     Wt Readings from Last 3 Encounters:  08/15/16 137 lb 6.4 oz (62.3 kg)  03/18/14 141 lb (64 kg)  03/10/14 142 lb 6.4 oz (64.6 kg)     GEN:  Well nourished, well developed in no acute distress, Ambulates with a cane HEENT: Normal NECK: No JVD; No carotid bruits LYMPHATICS: No lymphadenopathy CARDIAC: RRR, no murmurs, rubs, gallops RESPIRATORY:  Clear to auscultation without rales, wheezing or rhonchi  ABDOMEN: Soft, non-tender, non-distended MUSCULOSKELETAL:  No edema; No deformity  SKIN: Warm and dry NEUROLOGIC:  Alert and oriented x 3 PSYCHIATRIC:  Normal affect   ASSESSMENT:    1. Left bundle branch block   2. Essential hypertension   3. Pure hypercholesterolemia    PLAN:    In order of problems listed above:  Atypical chest pain  - Previous stress tests have been reassuring. No ischemia. We will continue to monitor.  - I mentioned her the possibility of invasive workup and she is not interested at this time.  Left bundle branch block  - Chronic. Prior nuclear stress test normal, low risk.  - Echocardiogram with normal EF.  Mitral regurgitation has been trivial.  Essential hypertension  - Medications reviewed. Managed closely by Dr.  Stephanie Acre  Hyperlipidemia  - Has been on fenofibrate   Medication Adjustments/Labs and Tests Ordered: Current medicines are reviewed at length with the patient today.  Concerns regarding medicines are outlined above. Labs and tests ordered and medication changes are outlined in the patient instructions below:  Patient Instructions  Medication Instructions:  Your physician recommends that you continue on your current medications as directed. Please refer to the Current Medication list given to you today.   Labwork: none  Testing/Procedures:  none  Follow-Up: Your physician wants you to follow-up in: 6 months.  You will receive a reminder letter in the mail two months in advance. If you don't receive a letter, please call our office to schedule the follow-up appointment.   Any Other Special Instructions Will Be Listed Below (If Applicable).     If you need a refill  on your cardiac medications before your next appointment, please call your pharmacy.      Signed, Candee Furbish, MD  08/15/2016 3:31 PM    [facility]

## 2016-08-15 NOTE — Patient Instructions (Signed)

## 2016-09-29 DIAGNOSIS — M810 Age-related osteoporosis without current pathological fracture: Secondary | ICD-10-CM | POA: Diagnosis not present

## 2016-10-13 DIAGNOSIS — M810 Age-related osteoporosis without current pathological fracture: Secondary | ICD-10-CM | POA: Diagnosis not present

## 2017-01-29 DIAGNOSIS — Z0000 Encounter for general adult medical examination without abnormal findings: Secondary | ICD-10-CM | POA: Diagnosis not present

## 2017-01-29 DIAGNOSIS — R079 Chest pain, unspecified: Secondary | ICD-10-CM | POA: Diagnosis not present

## 2017-01-29 DIAGNOSIS — Z79899 Other long term (current) drug therapy: Secondary | ICD-10-CM | POA: Diagnosis not present

## 2017-01-29 DIAGNOSIS — I447 Left bundle-branch block, unspecified: Secondary | ICD-10-CM | POA: Diagnosis not present

## 2017-01-29 DIAGNOSIS — F325 Major depressive disorder, single episode, in full remission: Secondary | ICD-10-CM | POA: Diagnosis not present

## 2017-01-29 DIAGNOSIS — D692 Other nonthrombocytopenic purpura: Secondary | ICD-10-CM | POA: Diagnosis not present

## 2017-01-29 DIAGNOSIS — E02 Subclinical iodine-deficiency hypothyroidism: Secondary | ICD-10-CM | POA: Diagnosis not present

## 2017-01-29 DIAGNOSIS — E785 Hyperlipidemia, unspecified: Secondary | ICD-10-CM | POA: Diagnosis not present

## 2017-01-29 DIAGNOSIS — I10 Essential (primary) hypertension: Secondary | ICD-10-CM | POA: Diagnosis not present

## 2017-01-29 DIAGNOSIS — N183 Chronic kidney disease, stage 3 (moderate): Secondary | ICD-10-CM | POA: Diagnosis not present

## 2017-01-29 DIAGNOSIS — E559 Vitamin D deficiency, unspecified: Secondary | ICD-10-CM | POA: Diagnosis not present

## 2017-01-29 DIAGNOSIS — E538 Deficiency of other specified B group vitamins: Secondary | ICD-10-CM | POA: Diagnosis not present

## 2017-01-31 ENCOUNTER — Ambulatory Visit: Payer: PPO | Admitting: Cardiology

## 2017-01-31 ENCOUNTER — Encounter: Payer: Self-pay | Admitting: Cardiology

## 2017-01-31 VITALS — BP 160/78 | HR 71 | Resp 16 | Wt 134.0 lb

## 2017-01-31 DIAGNOSIS — E7800 Pure hypercholesterolemia, unspecified: Secondary | ICD-10-CM | POA: Diagnosis not present

## 2017-01-31 DIAGNOSIS — I10 Essential (primary) hypertension: Secondary | ICD-10-CM

## 2017-01-31 DIAGNOSIS — I447 Left bundle-branch block, unspecified: Secondary | ICD-10-CM | POA: Diagnosis not present

## 2017-01-31 DIAGNOSIS — R079 Chest pain, unspecified: Secondary | ICD-10-CM

## 2017-01-31 NOTE — Progress Notes (Signed)
Cardiology Office Note:    Date:  01/31/2017   ID:  Susan Marks, DOB 03/14/1926, MRN [id number]  PCP:  Jonathon Jordan, MD  Cardiologist:  Candee Furbish, MD    Referring MD: Jonathon Jordan, MD     History of Present Illness:    Susan Marks is a 83 y.o. female with hypertension, hyperlipidemia, anemia who have seen in the distant past greater than 3 years ago here with follow-up for bundle branch block, PVCs, prior chest pain.  In review of prior office note by Dr. Radford Pax on 03/10/14, she was having chest discomfort with exertion when walking the dog but very brief. She underwent nuclear stress test on 03/19/14 and everything was fine.  Today she states that she's had a couple episodes of chest discomfort sometimes described as a 6/10 at rest, left upper chest sometimes feels as though it goes to her back, lasting a few minutes duration. She had one episode when walking across [employer] in the cold. These are usually brief and rare episodes. No other associated symptoms.  Occasional cramping in her legs. Good overall pulses.  Her sister who is 45 years younger than her had bypass surgery.  01/31/17-she comes in today with a couple stories of atypical chest discomfort sometimes with exertion when walking through a parking lot for instance.  The seem to be relieved with rest.  No associated shortness of breath or diaphoresis.  She also stated that as she was walking through the parking lot outside of her building she fell.  To gentleman helped her up.  She was able to ambulate to our office without difficulty but she did sustain a fall onto her right shoulder and her right knee.  Please see below for evaluation.  Past Medical History:  Diagnosis Date  . Chronic headaches    migraines  . High blood pressure   . High cholesterol     Past Surgical History:  Procedure Laterality Date  . VESICOVAGINAL FISTULA CLOSURE W/ TAH      Current Medications: Current  Meds  Medication Sig  . aspirin 81 MG tablet Take 81 mg by mouth daily.  . Azelastine-Fluticasone (DYMISTA) 137-50 MCG/ACT SUSP Place 1-2 sprays into both nostrils at bedtime.  . Calcium-Vitamin D-Vitamin K (VIACTIV PO) Take 2 capsules by mouth daily.  . cholecalciferol (VITAMIN D) 400 units TABS tablet Take 400 Units by mouth.  . cyanocobalamin 2000 MCG tablet Take 2,000 mcg by mouth daily.  Marland Kitchen denosumab (PROLIA) 60 MG/ML SOLN injection Inject 60 mg into the skin every 6 (six) months. Administer in upper arm, thigh, or abdomen  . diazepam (VALIUM) 2 MG tablet Take 1 mg by mouth as needed for anxiety.  Marland Kitchen esomeprazole (NEXIUM) 40 MG capsule Take 40 mg by mouth daily.   . fenofibrate micronized (LOFIBRA) 200 MG capsule Take 200 mg by mouth at bedtime.  . Fluticasone Furoate-Vilanterol (BREO ELLIPTA) 100-25 MCG/INH AEPB Inhale 1 puff into the lungs daily. RINSE MOUTH WELL AFTER USE  . ibuprofen (ADVIL,MOTRIN) 200 MG tablet Take 200 mg by mouth as needed.  Marland Kitchen losartan (COZAAR) 100 MG tablet TAKE 1 TABLET BY MOUTH EVERY DAY  . polyethylene glycol (MIRALAX / GLYCOLAX) packet Take 17 g by mouth once a week.     Allergies:   Sulfa antibiotics; Lisinopril; and Statins   Social History   Socioeconomic History  . Marital status: Married    Spouse name: None  . Number of children: 2  . Years of  education: None  . Highest education level: None  Social Needs  . Financial resource strain: None  . Food insecurity - worry: None  . Food insecurity - inability: None  . Transportation needs - medical: None  . Transportation needs - non-medical: None  Occupational History  . Occupation: Firefighter  Tobacco Use  . Smoking status: Never Smoker  . Smokeless tobacco: Never Used  Substance and Sexual Activity  . Alcohol use: Yes    Comment: occasional wine(red)  . Drug use: No  . Sexual activity: None  Other Topics Concern  . None  Social History Narrative  . None     Family History: The  patient's family history includes Allergies in her sister; Glaucoma in her sister; Heart disease in her father; Heart murmur in her sister. ROS:   Please see the history of present illness.     All other systems reviewed and are negative.  EKGs/Labs/Other Studies Reviewed:    The following studies were reviewed today: Prior office notes, lab work, stress test reviewed. Stress test normal.  Echocardiogram 03/11/14:  - Left ventricle: Abnormal septal motion. The cavity size was normal. Systolic function was normal. The estimated ejection fraction was 55%. Wall motion was normal; there were no regional wall motion abnormalities. Doppler parameters are consistent with abnormal left ventricular relaxation (grade 1 diastolic dysfunction). - Aortic valve: There was trivial regurgitation. - Mitral valve: There was mild regurgitation. - Left atrium: The atrium was mildly dilated. - Atrial septum: No defect or patent foramen ovale was identified.  EKG:  EKG is  ordered today.  The ekg ordered today demonstrates 08/15/16-sinus bradycardia first-degree AV block 212 ms with heart rate of 50 bpm with left bundle branch block. Prior EKG demonstrated left bundle branch block.  Recent Labs: No results found for requested labs within last 8760 hours.   Recent Lipid Panel No results found for: CHOL, TRIG, HDL, CHOLHDL, VLDL, LDLCALC, LDLDIRECT  Physical Exam:    VS:  BP (!) 160/78   Pulse 71   Resp 16   Wt 134 lb (60.8 kg)   SpO2 98%   BMI 27.06 kg/m     Wt Readings from Last 3 Encounters:  01/31/17 134 lb (60.8 kg)  08/15/16 137 lb 6.4 oz (62.3 kg)  03/18/14 141 lb (64 kg)     GEN: Well nourished, well developed, in no acute distress  HEENT: normal  Neck: no JVD, carotid bruits, or masses Cardiac: RRR; no murmurs, rubs, or gallops,no edema  Respiratory:  clear to auscultation bilaterally, normal work of breathing GI: soft, nontender, nondistended, + BS MS: no deformity or  atrophy  Skin: warm and dry, no rash, no muscular deformities.  Please see below for details after her fall. Neuro:  Alert and Oriented x 3, Strength and sensation are intact Psych: euthymic mood, full affect   ASSESSMENT:    1. Left bundle branch block   2. Chest pain, unspecified type   3. Essential hypertension   4. Pure hypercholesterolemia    PLAN:    In order of problems listed above:  Atypical chest pain  -Nuclear stress test will be ordered.  Has been 3 years since her last study.  She did have some exertional symptoms.  Lexiscan.  Left bundle branch block  - Chronic.   - Echocardiogram with normal EF.  Mitral regurgitation has been trivial.  Essential hypertension  - Medications reviewed. Managed closely by Dr. Stephanie Acre, slightly elevated today but she did suffer a fall  before coming into our office.  Hyperlipidemia  - Has been on fenofibrate  Right upper arm pain.  She is able to extend her arm/shoulder, supinate pronate and she is nontender to palpation but she does feel some soreness in the upper lateral aspect of her humerus.  I have suggested that she have further evaluation especially if symptoms worsen.  She has no neurologic deficits.  She is able to move all fingers comfortably.  She also skinned her right knee.  Hemostasis is maintained.  She is able to ambulate without difficulty.  Once again if her muscular skeletal symptoms worsen, I have asked her to seek medical attention.   Medication Adjustments/Labs and Tests Ordered: Current medicines are reviewed at length with the patient today.  Concerns regarding medicines are outlined above. Labs and tests ordered and medication changes are outlined in the patient instructions below:  Patient Instructions  Medication Instructions:  The current medical regimen is effective;  continue present plan and medications.  Testing/Procedures: Your physician has requested that you have a lexiscan myoview. For further  information please visit [email or web address]. Please follow instruction sheet, as given.  Follow-Up: Follow up in 1 year with Dr. Marlou Porch.  You will receive a letter in the mail 2 months before you are due.  Please call us when you receive this letter to schedule your follow up appointment.  If you need a refill on your cardiac medications before your next appointment, please call your pharmacy.  Thank you for choosing [facility]!!        Signed, Candee Furbish, MD  01/31/2017 2:42 PM    [facility]

## 2017-01-31 NOTE — Patient Instructions (Signed)
Medication Instructions:  The current medical regimen is effective;  continue present plan and medications.  Testing/Procedures: Your physician has requested that you have a lexiscan myoview. For further information please visit [email or web address]. Please follow instruction sheet, as given.  Follow-Up: Follow up in 1 year with Dr. Skains.  You will receive a letter in the mail 2 months before you are due.  Please call us when you receive this letter to schedule your follow up appointment.  If you need a refill on your cardiac medications before your next appointment, please call your pharmacy.  Thank you for choosing [facility]!!     

## 2017-02-01 ENCOUNTER — Telehealth (HOSPITAL_COMMUNITY): Payer: Self-pay | Admitting: *Deleted

## 2017-02-01 DIAGNOSIS — M545 Low back pain: Secondary | ICD-10-CM | POA: Diagnosis not present

## 2017-02-01 DIAGNOSIS — M25551 Pain in right hip: Secondary | ICD-10-CM | POA: Diagnosis not present

## 2017-02-01 DIAGNOSIS — M25511 Pain in right shoulder: Secondary | ICD-10-CM | POA: Diagnosis not present

## 2017-02-01 NOTE — Telephone Encounter (Signed)
Left message on voicemail in reference to upcoming appointment scheduled for 04/07/16. Phone number given for a call back so details instructions can be given.  Susan Marks

## 2017-02-02 ENCOUNTER — Telehealth (HOSPITAL_COMMUNITY): Payer: Self-pay

## 2017-02-02 NOTE — Telephone Encounter (Signed)
Patient given detailed instructions per Myocardial Perfusion Study Information Sheet for the test on 02/05/17 at 0945. Patient notified to arrive 15 minutes early and that it is imperative to arrive on time for appointment to keep from having the test rescheduled.  If you need to cancel or reschedule your appointment, please call the office within 24 hours of your appointment. . Patient verbalized understanding.T. Avey Mcmanamon, CNMT, RT-N

## 2017-02-05 ENCOUNTER — Ambulatory Visit (HOSPITAL_COMMUNITY): Payer: PPO | Attending: Cardiology

## 2017-02-05 DIAGNOSIS — R002 Palpitations: Secondary | ICD-10-CM | POA: Insufficient documentation

## 2017-02-05 DIAGNOSIS — I10 Essential (primary) hypertension: Secondary | ICD-10-CM | POA: Insufficient documentation

## 2017-02-05 DIAGNOSIS — R9439 Abnormal result of other cardiovascular function study: Secondary | ICD-10-CM | POA: Insufficient documentation

## 2017-02-05 DIAGNOSIS — I447 Left bundle-branch block, unspecified: Secondary | ICD-10-CM | POA: Diagnosis not present

## 2017-02-05 DIAGNOSIS — R0789 Other chest pain: Secondary | ICD-10-CM | POA: Insufficient documentation

## 2017-02-05 DIAGNOSIS — R42 Dizziness and giddiness: Secondary | ICD-10-CM | POA: Diagnosis not present

## 2017-02-05 DIAGNOSIS — R079 Chest pain, unspecified: Secondary | ICD-10-CM | POA: Diagnosis not present

## 2017-02-05 LAB — MYOCARDIAL PERFUSION IMAGING
CHL CUP NUCLEAR SSS: 13
LHR: 0.2
LV sys vol: 23 mL
LVDIAVOL: 66 mL (ref 46–106)
Peak HR: 54 {beats}/min
Rest HR: 52 {beats}/min
SDS: 7
SRS: 6
TID: 0.81

## 2017-02-05 MED ORDER — TECHNETIUM TC 99M TETROFOSMIN IV KIT
10.3000 | PACK | Freq: Once | INTRAVENOUS | Status: AC | PRN
  Administered 2017-02-05: 10.3 via INTRAVENOUS
  Filled 2017-02-05: qty 11

## 2017-02-05 MED ORDER — TECHNETIUM TC 99M TETROFOSMIN IV KIT
32.9000 | PACK | Freq: Once | INTRAVENOUS | Status: AC | PRN
  Administered 2017-02-05: 32.9 via INTRAVENOUS
  Filled 2017-02-05: qty 33

## 2017-02-05 MED ORDER — REGADENOSON 0.4 MG/5ML IV SOLN
0.4000 mg | Freq: Once | INTRAVENOUS | Status: AC
  Administered 2017-02-05: 0.4 mg via INTRAVENOUS

## 2017-02-05 MED ORDER — AMINOPHYLLINE 25 MG/ML IV SOLN
75.0000 mg | Freq: Once | INTRAVENOUS | Status: AC
  Administered 2017-02-05: 75 mg via INTRAVENOUS

## 2017-02-06 ENCOUNTER — Ambulatory Visit: Payer: PPO | Attending: Chiropractic Medicine | Admitting: Physical Therapy

## 2017-02-06 DIAGNOSIS — M25511 Pain in right shoulder: Secondary | ICD-10-CM | POA: Insufficient documentation

## 2017-02-06 DIAGNOSIS — R2681 Unsteadiness on feet: Secondary | ICD-10-CM | POA: Insufficient documentation

## 2017-02-06 DIAGNOSIS — M25551 Pain in right hip: Secondary | ICD-10-CM | POA: Diagnosis not present

## 2017-02-06 DIAGNOSIS — R262 Difficulty in walking, not elsewhere classified: Secondary | ICD-10-CM | POA: Diagnosis not present

## 2017-02-06 DIAGNOSIS — M6281 Muscle weakness (generalized): Secondary | ICD-10-CM | POA: Insufficient documentation

## 2017-02-06 NOTE — Therapy (Signed)
[facility] [address] [place], [place], [address] Phone: [phone]   Fax:  [phone]  Physical Therapy Evaluation  Patient Details  Name: Susan Marks MRN: [id number] Date of Birth: [birth year] Referring Provider: Levy Pupa, PA-C   Encounter Date: 02/06/2017  PT End of Session - 02/06/17 1144    Visit Number  1    Number of Visits  17    Date for PT Re-Evaluation  04/03/17    Authorization Type  [employer]     Authorization Time Period  02/06/17 to 04/03/16    PT Start Time  1100    PT Stop Time  1150 10 min untimed modalities    PT Time Calculation (min)  50 min    Activity Tolerance  No increased pain;Patient tolerated treatment well    Behavior During Therapy  [facility] for tasks assessed/performed       Past Medical History:  Diagnosis Date  . Chronic headaches    migraines  . High blood pressure   . High cholesterol     Past Surgical History:  Procedure Laterality Date  . VESICOVAGINAL FISTULA CLOSURE W/ TAH      There were no vitals filed for this visit.   Subjective Assessment - 02/06/17 1103    Subjective  Pt reports falling recently on the way to her cardiologist. She reports that she fell onto her Rt side and injured her Rt shoulder and Rt hip. Currently, her hips is much improved, but her Rt shoulder is the most irritable. She often is not able to sleep without it bothering her. She also notes that she has chronic back problems.     Pertinent History  HTN, osteoporosis     Diagnostic tests  Xray: negative     Patient Stated Goals  improve pain     Currently in Pain?  Yes    Pain Score  6     Pain Location  Shoulder    Pain Orientation  Right    Pain Descriptors / Indicators  Aching    Pain Type  Acute pain    Pain Radiating Towards  none     Pain Onset  In the past 7 days    Pain Frequency  Constant    Aggravating Factors   unsure, just there, laying on the shoulder      Pain Relieving Factors  resting         [facility] PT Assessment - 02/06/17 0001      Assessment   Medical Diagnosis  Rt shoulder and Rt hip pain     Referring Provider  Levy Pupa, PA-C    Onset Date/Surgical Date  -- ~1 week ago     Hand Dominance  Right    Prior Therapy  for general conditioning       Precautions   Precautions  Other (comment) osteporosis       Balance Screen   Has the patient fallen in the past 6 months  Yes    How many times?  1    Has the patient had a decrease in activity level because of a fear of falling?   Yes    Is the patient reluctant to leave their home because of a fear of falling?   No      Home Social worker  Private residence    Living Arrangements  Spouse/significant other    Additional Comments  3 STE  Prior Function   Level of Independence  Independent      Sensation   Light Touch  Appears Intact      Posture/Postural Control   Posture Comments  Forward head, rounded shoulders       ROM / Strength   AROM / PROM / Strength  AROM;Strength      AROM   AROM Assessment Site  Shoulder    Right/Left Shoulder  Right;Left all shoulder AROM WFL      Strength   Strength Assessment Site  Shoulder;Elbow;Hip;Knee;Ankle    Right/Left Shoulder  Right;Left    Right Shoulder Flexion  4/5    Right Shoulder ABduction  3+/5    Right Shoulder Internal Rotation  5/5    Right Shoulder External Rotation  4-/5 (+) pain     Left Shoulder Flexion  4/5    Left Shoulder ABduction  3+/5    Left Shoulder Internal Rotation  5/5    Left Shoulder External Rotation  4+/5    Right/Left Elbow  -- BUE 5/5    Right/Left Hip  Right;Left    Right Hip Flexion  3+/5    Right Hip Extension  3/5    Right Hip ABduction  3/5    Left Hip Flexion  3+/5    Left Hip Extension  3+/5    Left Hip ABduction  3+/5    Right/Left Knee  Right;Left    Right Knee Flexion  4/5    Right Knee Extension  5/5    Left Knee Flexion  4/5    Left Knee  Extension  5/5      Flexibility   Soft Tissue Assessment /Muscle Length  yes    Hamstrings  WNL      Palpation   Palpation comment  tenderness Rt teres minor/major/infraspinatus/anterior deltoid and bicep; Rt hip tender over greater trochanter/glute med      Transfers   Five time sit to stand comments   13 sec, UE on thighs, hip adduction       Ambulation/Gait   Gait Comments  using SPC      Standardized Balance Assessment   Standardized Balance Assessment  Timed Up and Go Test      Timed Up and Go Test   TUG Comments  13 sec with SPC      High Level Balance   High Level Balance Comments  SLS: unable              Objective measurements completed on examination: See above findings.      [facility] Adult PT Treatment/Exercise - 02/06/17 0001      Modalities   Modalities  Cryotherapy      Cryotherapy   Number Minutes Cryotherapy  10 Minutes    Cryotherapy Location  Other (comment) Rt shoulder, Rt hip in sitting     Type of Cryotherapy  Ice pack             PT Education - 02/06/17 1141    Education provided  Yes    Education Details  eval findings/POC; focus on UE and hip initially and then shifting focus towards balance/low back afterwards if needed; encouraged pt to use ice and discussed parameters for home    Person(s) Educated  Patient    Methods  Explanation    Comprehension  Verbalized understanding       PT Short Term Goals - 02/06/17 1300      PT SHORT TERM GOAL #1   Title  Pt will demo consistency and independence with her HEP to improve pain and strength of the Rt hip and shoulder.     Time  3    Period  Weeks    Status  New      PT SHORT TERM GOAL #2   Title  Pt will report atleast 25% improvement in Rt shoulder and Rt hip pain from the start of therapy.     Time  4    Period  Weeks    Status  New        PT Long Term Goals - 02/06/17 1319      PT LONG TERM GOAL #1   Title  Pt will demo improved BLE strength to atleast 4/5MMT, and Rt  shoulder strength to 4+/5 MMT which will increase her safety and efficiency with daily activity.     Time  8    Period  Weeks    Status  New      PT LONG TERM GOAL #2   Title  Pt will report atleast 50% resolution in her Rt shoulder and hip discomfort with activity.     Time  8    Period  Weeks    Status  New      PT LONG TERM GOAL #3   Title  Pt will demo improved functional strength evident by her ability to complete 5x sit to stand in less tan 13 sec and without noted knee valgus deviation or use of UE for support.     Time  8    Period  Weeks    Status  New      PT LONG TERM GOAL #4   Title  Pt will report improved stability evident by her report of no LOB from the start of therapy, to decrease her risk of falling and causing injury.    Time  8    Period  Weeks    Status  New      PT LONG TERM GOAL #5   Title  Pt will demo understanding and independence with her advanced HEP as well as verbalize understanding of the importance of continued adherence in order to prevent decline in her status.     Time  8    Period  Weeks    Status  New             Plan - 02/06/17 1325    Clinical Impression Statement  Pt is a 83 y.o F, familiar to our clinic, who presents with Rt shoulder and hip pain following a fall ~1 week ago. She demonstrates shoulder ROM that is within functional limits, however she does have weakness with resistance of the both the UE and LE Rt>Lt. Pt is tender with muscle spasm along the anterior and posterior shoulder and although she has history of trochanteric bursitis, she also is tender along the Rt greater trochanter and gluteal region more so than usual. Pt does report new onset of dizziness with turning over in bed following her fall and would benefit from possible vestibular evaluation in the near future to rule out BPPV causes of her symptoms. Pt would benefit from skilled PT to address her limitations and improve BLE/BUE strength, increase stability and  confidence with functional activity and improve her safety with activity in the community to decrease her risk of falling and injury.     History and Personal Factors relevant to plan of care:  PT in the past for general deconditioning; history  of trochanteric bursitis    Clinical Presentation  Stable    Clinical Presentation due to:  not worse since the fall, somewhat improved    Rehab Potential  Good    PT Frequency  2x / week    PT Duration  8 weeks    PT Treatment/Interventions  ADLs/Self Care Home Management;Cryotherapy;Electrical Stimulation;Moist Heat;Iontophoresis 4mg /ml Dexamethasone;Therapeutic exercise;Therapeutic activities;Functional mobility training;Stair training;Gait training;Balance training;Neuromuscular re-education;Patient/family education;Taping;Manual techniques;Passive range of motion;Dry needling;Vestibular    PT Next Visit Plan  establish HEP to address shoulder/hip strength; modalities and manual techniques to address muscle spasm of the shoulder/hip    PT Home Exercise Plan  next session     Recommended Other Services  Possible vestibular involvement     Consulted and Agree with Plan of Care  Patient       Patient will benefit from skilled therapeutic intervention in order to improve the following deficits and impairments:  Abnormal gait, Decreased activity tolerance, Decreased balance, Difficulty walking, Decreased endurance, Decreased strength, Impaired UE functional use, Increased muscle spasms, Postural dysfunction, Pain, Decreased mobility  Visit Diagnosis: Acute pain of right shoulder  Pain in right hip  Muscle weakness (generalized)  Unsteadiness on feet  G-Codes - 02/13/17 1324    Functional Assessment Tool Used (Outpatient Only)  FOTO: clinical judgement based on assessment of ROM, strength and functional performance testing     Functional Limitation  Other PT primary    Other PT Primary Current Status (Z8588)  At least 40 percent but less than 60  percent impaired, limited or restricted    Other PT Primary Goal Status (F0277)  At least 20 percent but less than 40 percent impaired, limited or restricted        Problem List Patient Active Problem List   Diagnosis Date Noted  . Chest pain 03/10/2014  . Benign essential HTN 03/10/2014  . Abnormal EKG 03/10/2014  . GERD (gastroesophageal reflux disease) 07/07/2013  . Granulomatous lung disease ([place]) 07/07/2013  . Cough 06/13/2013    1:40 PM,02-13-2017 Elly Modena PT, DPT [facility] [address], [place], [place], [address] Phone: [phone]   Fax:  [phone]  Name: Susan Marks MRN: [id number] Date of Birth: 09-12-1925

## 2017-02-07 ENCOUNTER — Telehealth: Payer: Self-pay | Admitting: Cardiology

## 2017-02-07 NOTE — Telephone Encounter (Signed)
I spoke with pt and reviewed stress test results with her.

## 2017-02-07 NOTE — Telephone Encounter (Signed)
Patient returning call for results. Patient will be available 10am-12pm. Thanks.

## 2017-02-07 NOTE — Telephone Encounter (Signed)
Left message to call back  

## 2017-02-14 ENCOUNTER — Ambulatory Visit: Payer: PPO | Admitting: Physical Therapy

## 2017-02-14 DIAGNOSIS — M25551 Pain in right hip: Secondary | ICD-10-CM

## 2017-02-14 DIAGNOSIS — R2681 Unsteadiness on feet: Secondary | ICD-10-CM

## 2017-02-14 DIAGNOSIS — M25511 Pain in right shoulder: Secondary | ICD-10-CM | POA: Diagnosis not present

## 2017-02-14 DIAGNOSIS — R262 Difficulty in walking, not elsewhere classified: Secondary | ICD-10-CM

## 2017-02-14 DIAGNOSIS — M6281 Muscle weakness (generalized): Secondary | ICD-10-CM

## 2017-02-14 NOTE — Therapy (Signed)
[facility] [address] [place], [place], [address] Phone: [phone]   Fax:  [phone]  Physical Therapy Treatment  Patient Details  Name: Susan Marks MRN: [id number] Date of Birth: April 29, 1925 Referring Provider: Levy Pupa, PA-C   Encounter Date: 02/14/2017  PT End of Session - 02/14/17 1127    Visit Number  2    Number of Visits  17    Date for PT Re-Evaluation  04/03/17    Authorization Type  [employer]     Authorization Time Period  02/06/17 to 04/03/17    PT Start Time  1102    PT Stop Time  1141    PT Time Calculation (min)  39 min    Activity Tolerance  No increased pain;Patient tolerated treatment well    Behavior During Therapy  [facility] for tasks assessed/performed       Past Medical History:  Diagnosis Date  . Chronic headaches    migraines  . High blood pressure   . High cholesterol     Past Surgical History:  Procedure Laterality Date  . VESICOVAGINAL FISTULA CLOSURE W/ TAH      There were no vitals filed for this visit.  Subjective Assessment - 02/14/17 1104    Subjective  Pt reports that her Rt shoulder is not bothering hardly at all anymore. Her hip and her balance are her primary concern at the moment.     Pertinent History  HTN, osteoporosis     Diagnostic tests  Xray: negative     Patient Stated Goals  improve pain     Currently in Pain?  No/denies    Pain Onset  In the past 7 days                      [facility] Adult PT Treatment/Exercise - 02/14/17 0001      Exercises   Exercises  Knee/Hip      Knee/Hip Exercises: Stretches   Other Knee/Hip Stretches  SKTC 5x10 sec each       Knee/Hip Exercises: Aerobic   Nustep  L1 x5 min, PT present to discuss session and provide pt education       Knee/Hip Exercises: Supine   Bridges  Both;1 set;10 reps    Other Supine Knee/Hip Exercises  single leg clam with yellow TB x10 reps each     Other Supine  Knee/Hip Exercises  hip abduction x5 reps (+) hip ER on Rt       Manual Therapy   Manual Therapy  Passive ROM;Soft tissue mobilization    Soft tissue mobilization  STM Rt glute max/med    Passive ROM  Rt hip extension 3x20 sec              PT Education - 02/14/17 1126    Education provided  Yes    Education Details  implemented and reviewed HEP; importance of improving hip ROM to increase muscle activation and strength with daily activity; implications for manual techniques     Person(s) Educated  Patient    Methods  Explanation;Handout    Comprehension  Verbalized understanding;Returned demonstration       PT Short Term Goals - 02/06/17 1300      PT SHORT TERM GOAL #1   Title  Pt will demo consistency and independence with her HEP to improve pain and strength of the Rt hip and shoulder.     Time  3  Period  Weeks    Status  New      PT SHORT TERM GOAL #2   Title  Pt will report atleast 25% improvement in Rt shoulder and Rt hip pain from the start of therapy.     Time  4    Period  Weeks    Status  New        PT Long Term Goals - 02/06/17 1319      PT LONG TERM GOAL #1   Title  Pt will demo improved BLE strength to atleast 4/5MMT, and Rt shoulder strength to 4+/5 MMT which will increase her safety and efficiency with daily activity.     Time  8    Period  Weeks    Status  New      PT LONG TERM GOAL #2   Title  Pt will report atleast 50% resolution in her Rt shoulder and hip discomfort with activity.     Time  8    Period  Weeks    Status  New      PT LONG TERM GOAL #3   Title  Pt will demo improved functional strength evident by her ability to complete 5x sit to stand in less tan 13 sec and without noted knee valgus deviation or use of UE for support.     Time  8    Period  Weeks    Status  New      PT LONG TERM GOAL #4   Title  Pt will report improved stability evident by her report of no LOB from the start of therapy, to decrease her risk of falling  and causing injury.    Time  8    Period  Weeks    Status  New      PT LONG TERM GOAL #5   Title  Pt will demo understanding and independence with her advanced HEP as well as verbalize understanding of the importance of continued adherence in order to prevent decline in her status.     Time  8    Period  Weeks    Status  New            Plan - 02/14/17 1143    Clinical Impression Statement  Pt arrived today reporting her Rt shoulder pain is nearly resolved since the evaluation. Today's session focused on therex to improve hip strength and flexibility. Pt demonstrates significant limitations in hip extension ROM, limiting full gluteal activation, and would benefit from further treatment to address this and decrease pain in the hip with activity. Also completed manual techniques to decrease muscle spasm throughout the gluteals, noting improvements in tissue flexibility following this.     Rehab Potential  Good    PT Frequency  2x / week    PT Duration  8 weeks    PT Treatment/Interventions  ADLs/Self Care Home Management;Cryotherapy;Electrical Stimulation;Moist Heat;Iontophoresis 4mg /ml Dexamethasone;Therapeutic exercise;Therapeutic activities;Functional mobility training;Stair training;Gait training;Balance training;Neuromuscular re-education;Patient/family education;Taping;Manual techniques;Passive range of motion;Dry needling;Vestibular    PT Next Visit Plan  progression of hip strength/flexibility; modalities and manual techniques to address muscle spasm of the shoulder/hip    Consulted and Agree with Plan of Care  Patient       Patient will benefit from skilled therapeutic intervention in order to improve the following deficits and impairments:  Abnormal gait, Decreased activity tolerance, Decreased balance, Difficulty walking, Decreased endurance, Decreased strength, Impaired UE functional use, Increased muscle spasms, Postural dysfunction, Pain, Decreased mobility  Visit  Diagnosis: Acute pain of right shoulder  Pain in right hip  Muscle weakness (generalized)  Unsteadiness on feet  Difficulty walking     Problem List Patient Active Problem List   Diagnosis Date Noted  . Chest pain 03/10/2014  . Benign essential HTN 03/10/2014  . Abnormal EKG 03/10/2014  . GERD (gastroesophageal reflux disease) 07/07/2013  . Granulomatous lung disease ([place]) 07/07/2013  . Cough 06/13/2013   11:48 AM,02/14/17 Elly Modena PT, DPT [facility] [facility] [address], [place], [place], [address] Phone: [phone]   Fax:  [phone]  Name: ALEXYA MCDARIS MRN: [id number] Date of Birth: [birth year]

## 2017-02-14 NOTE — Patient Instructions (Addendum)
  SUPINE HIP ABDUCTION - ELASTIC BAND CLAMS - CLAMSHELL  Lie down on your back with your knees bent. Place an elastic band around your knees and then draw your knees apart.  x10 reps   SINGLE KNEE TO CHEST STRETCH - SKTC  While lying on your back, use your hands and gently draw up a knee towards your chest.   Keep your other knee straight and lying on the ground.  Hold 10 sec, repeat 5x       BRIDGING  While lying on your back with knees bent, tighten your lower abdominals, squeeze your buttocks and then raise your buttocks off the floor/bed as creating a "Bridge" with your body. Hold and then lower yourself and repeat.  x10 reps    [facility] [address] [place], [place] [address] Phone # [phone] Fax [phone]

## 2017-02-16 ENCOUNTER — Encounter: Payer: Self-pay | Admitting: Rehabilitation

## 2017-02-16 ENCOUNTER — Ambulatory Visit: Payer: PPO | Admitting: Rehabilitation

## 2017-02-16 DIAGNOSIS — R262 Difficulty in walking, not elsewhere classified: Secondary | ICD-10-CM

## 2017-02-16 DIAGNOSIS — M25551 Pain in right hip: Secondary | ICD-10-CM

## 2017-02-16 DIAGNOSIS — M25511 Pain in right shoulder: Secondary | ICD-10-CM | POA: Diagnosis not present

## 2017-02-16 DIAGNOSIS — R2681 Unsteadiness on feet: Secondary | ICD-10-CM

## 2017-02-16 DIAGNOSIS — M6281 Muscle weakness (generalized): Secondary | ICD-10-CM

## 2017-02-16 NOTE — Therapy (Signed)
[facility] [address] [place], [place], [address] Phone: [phone]   Fax:  [phone]  Physical Therapy Treatment  Patient Details  Name: Susan Marks MRN: [id number] Date of Birth: Jun 11, 1925 Referring Provider: Levy Pupa, PA-C   Encounter Date: 02/16/2017  PT End of Session - 02/16/17 1104    Visit Number  3    Number of Visits  17    Date for PT Re-Evaluation  04/03/17    Authorization Type  [employer]     Authorization Time Period  02/06/17 to 04/03/17    PT Start Time  1015    PT Stop Time  1058    PT Time Calculation (min)  43 min    Activity Tolerance  Patient tolerated treatment well       Past Medical History:  Diagnosis Date  . Chronic headaches    migraines  . High blood pressure   . High cholesterol     Past Surgical History:  Procedure Laterality Date  . VESICOVAGINAL FISTULA CLOSURE W/ TAH      There were no vitals filed for this visit.  Subjective Assessment - 02/16/17 1016    Subjective  Feeling better now that she has stretched.  It was bad this morning.  The shoulder is still feeling better.  "When I did the single leg clam shell I could feel something cracking everytime I moved the leg".  The bridges are also hard on the bed.      Currently in Pain?  Yes    Pain Score  1     Pain Location  Hip    Pain Orientation  Right    Pain Descriptors / Indicators  Aching    Aggravating Factors   unsure, sleeping    Pain Relieving Factors  resting                      [facility] Adult PT Treatment/Exercise - 02/16/17 0001      High Level Balance   High Level Balance Comments  at treadmill: march x10 HHA x 1, heel to toe rock x 10 HHAx2, feet together head turns R/L SBA      Knee/Hip Exercises: Stretches   Other Knee/Hip Stretches  SKTC 5x10 sec each bil    Other Knee/Hip Stretches  fig4 position gentle self press into ER 2x30"      Knee/Hip Exercises:  Supine   Bridges  Both;1 set;10 reps    Other Supine Knee/Hip Exercises  single leg clam with yellow TB x10 reps each     Other Supine Knee/Hip Exercises  hip abduction clam sidelying      Manual Therapy   Soft tissue mobilization  STM Rt glute max/med, ITB in sidelying    Passive ROM  in supine Rt hip flexion, abduction, ER               PT Short Term Goals - 02/06/17 1300      PT SHORT TERM GOAL #1   Title  Pt will demo consistency and independence with her HEP to improve pain and strength of the Rt hip and shoulder.     Time  3    Period  Weeks    Status  New      PT SHORT TERM GOAL #2   Title  Pt will report atleast 25% improvement in Rt shoulder and Rt hip pain from the start of therapy.  Time  4    Period  Weeks    Status  New        PT Long Term Goals - 02/06/17 1319      PT LONG TERM GOAL #1   Title  Pt will demo improved BLE strength to atleast 4/5MMT, and Rt shoulder strength to 4+/5 MMT which will increase her safety and efficiency with daily activity.     Time  8    Period  Weeks    Status  New      PT LONG TERM GOAL #2   Title  Pt will report atleast 50% resolution in her Rt shoulder and hip discomfort with activity.     Time  8    Period  Weeks    Status  New      PT LONG TERM GOAL #3   Title  Pt will demo improved functional strength evident by her ability to complete 5x sit to stand in less tan 13 sec and without noted knee valgus deviation or use of UE for support.     Time  8    Period  Weeks    Status  New      PT LONG TERM GOAL #4   Title  Pt will report improved stability evident by her report of no LOB from the start of therapy, to decrease her risk of falling and causing injury.    Time  8    Period  Weeks    Status  New      PT LONG TERM GOAL #5   Title  Pt will demo understanding and independence with her advanced HEP as well as verbalize understanding of the importance of continued adherence in order to prevent decline in her  status.     Time  8    Period  Weeks    Status  New            Plan - 02/16/17 1105    Clinical Impression Statement  Treament focusing on R hip pain and strengthening and initiated some balance work.  Pt reports some increased R hip pain with all TE but "not bad", 1 ttp R lateral thigh and glutes, reports some feelings of shakiness with balance work but no LOB.  Declined nustep due to feelings of a cramp    Rehab Potential  Good    PT Frequency  2x / week    PT Duration  8 weeks    PT Treatment/Interventions  ADLs/Self Care Home Management;Cryotherapy;Electrical Stimulation;Moist Heat;Iontophoresis 4mg /ml Dexamethasone;Therapeutic exercise;Therapeutic activities;Functional mobility training;Stair training;Gait training;Balance training;Neuromuscular re-education;Patient/family education;Taping;Manual techniques;Passive range of motion;Dry needling;Vestibular    PT Next Visit Plan  progression of hip strength/flexibility; modalities and manual techniques to address muscle spasm of the shoulder/hip       Patient will benefit from skilled therapeutic intervention in order to improve the following deficits and impairments:  Abnormal gait, Decreased activity tolerance, Decreased balance, Difficulty walking, Decreased endurance, Decreased strength, Impaired UE functional use, Increased muscle spasms, Postural dysfunction, Pain, Decreased mobility  Visit Diagnosis: Pain in right hip  Muscle weakness (generalized)  Unsteadiness on feet  Difficulty walking     Problem List Patient Active Problem List   Diagnosis Date Noted  . Chest pain 03/10/2014  . Benign essential HTN 03/10/2014  . Abnormal EKG 03/10/2014  . GERD (gastroesophageal reflux disease) 07/07/2013  . Granulomatous lung disease ([place]) 07/07/2013  . Cough 06/13/2013    Stark Bray, DPT, CMP 02/16/2017, 11:08 AM  [facility] [address] [place], [place], [address] Phone: [phone]   Fax:  [phone]  Name: PENNI PENADO MRN: [id number] Date of Birth: Jun 01, 1925

## 2017-02-21 ENCOUNTER — Ambulatory Visit: Payer: PPO | Attending: Chiropractic Medicine | Admitting: Physical Therapy

## 2017-02-21 ENCOUNTER — Encounter: Payer: PPO | Admitting: Physical Therapy

## 2017-02-21 DIAGNOSIS — M25551 Pain in right hip: Secondary | ICD-10-CM | POA: Insufficient documentation

## 2017-02-21 DIAGNOSIS — M25511 Pain in right shoulder: Secondary | ICD-10-CM | POA: Diagnosis not present

## 2017-02-21 DIAGNOSIS — R2681 Unsteadiness on feet: Secondary | ICD-10-CM | POA: Insufficient documentation

## 2017-02-21 DIAGNOSIS — M6281 Muscle weakness (generalized): Secondary | ICD-10-CM | POA: Insufficient documentation

## 2017-02-21 DIAGNOSIS — R262 Difficulty in walking, not elsewhere classified: Secondary | ICD-10-CM | POA: Diagnosis not present

## 2017-02-21 NOTE — Therapy (Signed)
[facility] [address], [place], [place], [address] Phone: [phone]   Fax:  [phone]  Physical Therapy Treatment  Patient Details  Name: Susan Marks MRN: [id number] Date of Birth: January 06, 1926 Referring Provider: Levy Pupa, PA-C   Encounter Date: 02/21/2017  PT End of Session - 02/21/17 1447    Visit Number  4    Number of Visits  17    Date for PT Re-Evaluation  04/03/17    Authorization Type  [employer]     Authorization Time Period  02/06/17 to 04/03/17    PT Start Time  1446    PT Stop Time  1525    PT Time Calculation (min)  39 min    Activity Tolerance  Patient tolerated treatment well;No increased pain    Behavior During Therapy  WFL for tasks assessed/performed       Past Medical History:  Diagnosis Date  . Chronic headaches    migraines  . High blood pressure   . High cholesterol     Past Surgical History:  Procedure Laterality Date  . VESICOVAGINAL FISTULA CLOSURE W/ TAH      There were no vitals filed for this visit.  Subjective Assessment - 02/21/17 1448    Subjective  Pt reports that yesterday was a bad day with low back and hip pain. She did her stretches later that evening. Today she has been up moving around alot, but she doesn't think she was doing anything to make it hurt. No pain currently.     Currently in Pain?  No/denies                      [facility] Adult PT Treatment/Exercise - 02/21/17 0001      Knee/Hip Exercises: Stretches   Hip Flexor Stretch  3 reps;Both;30 seconds;Limitations    Hip Flexor Stretch Limitations  thomas position       Knee/Hip Exercises: Seated   Hamstring Curl  Both;1 set;10 reps    Hamstring Limitations  double red TB      Knee/Hip Exercises: Supine   Hip Adduction Isometric  Both;2 sets;10 reps;Other (comment) ball squeeze in hooklying     Bridges  2 sets;10 reps;Both    Other Supine Knee/Hip Exercises  single  leg hip extension isometric into table x5 sec hold, x10 reps each             PT Education - 02/21/17 1453    Education provided  Yes    Education Details  encouraged pt to complete HEP/stretches more regularly even on bad days    Person(s) Educated  Patient    Methods  Explanation    Comprehension  Verbalized understanding       PT Short Term Goals - 02/06/17 1300      PT SHORT TERM GOAL #1   Title  Pt will demo consistency and independence with her HEP to improve pain and strength of the Rt hip and shoulder.     Time  3    Period  Weeks    Status  New      PT SHORT TERM GOAL #2   Title  Pt will report atleast 25% improvement in Rt shoulder and Rt hip pain from the start of therapy.     Time  4    Period  Weeks    Status  New        PT Long Term Goals - 02/06/17  [place] #1   Title  Pt will demo improved BLE strength to atleast 4/5MMT, and Rt shoulder strength to 4+/5 MMT which will increase her safety and efficiency with daily activity.     Time  8    Period  Weeks    Status  New      PT LONG TERM GOAL #2   Title  Pt will report atleast 50% resolution in her Rt shoulder and hip discomfort with activity.     Time  8    Period  Weeks    Status  New      PT LONG TERM GOAL #3   Title  Pt will demo improved functional strength evident by her ability to complete 5x sit to stand in less tan 13 sec and without noted knee valgus deviation or use of UE for support.     Time  8    Period  Weeks    Status  New      PT LONG TERM GOAL #4   Title  Pt will report improved stability evident by her report of no LOB from the start of therapy, to decrease her risk of falling and causing injury.    Time  8    Period  Weeks    Status  New      PT LONG TERM GOAL #5   Title  Pt will demo understanding and independence with her advanced HEP as well as verbalize understanding of the importance of continued adherence in order to prevent decline in her status.      Time  8    Period  Weeks    Status  New            Plan - 02/21/17 1502    Clinical Impression Statement  Continued this session with therex progressions to improve hip strength, noting pt was able to increase her sets and repetitions without any report of pain. Increased to red TB resistance with clamshells at home without any difficulty. Introduced more stretches this session, addressing hip mobility limitations and ended with pt reporting no increase in pain during or following today's session.     Rehab Potential  Good    PT Frequency  2x / week    PT Duration  8 weeks    PT Treatment/Interventions  ADLs/Self Care Home Management;Cryotherapy;Electrical Stimulation;Moist Heat;Iontophoresis 4mg /ml Dexamethasone;Therapeutic exercise;Therapeutic activities;Functional mobility training;Stair training;Gait training;Balance training;Neuromuscular re-education;Patient/family education;Taping;Manual techniques;Passive range of motion;Dry needling;Vestibular    PT Next Visit Plan  progression of hip strength/flexibility; modalities and manual techniques to address muscle spasm of the shoulder/hip       Patient will benefit from skilled therapeutic intervention in order to improve the following deficits and impairments:  Abnormal gait, Decreased activity tolerance, Decreased balance, Difficulty walking, Decreased endurance, Decreased strength, Impaired UE functional use, Increased muscle spasms, Postural dysfunction, Pain, Decreased mobility  Visit Diagnosis: Pain in right hip  Muscle weakness (generalized)  Unsteadiness on feet  Difficulty walking  Acute pain of right shoulder     Problem List Patient Active Problem List   Diagnosis Date Noted  . Chest pain 03/10/2014  . Benign essential HTN 03/10/2014  . Abnormal EKG 03/10/2014  . GERD (gastroesophageal reflux disease) 07/07/2013  . Granulomatous lung disease ([place]) 07/07/2013  . Cough 06/13/2013    3:28  PM,02/21/17 Elly Modena PT, [place] [facility] [place]  [facility] [address], [place], [place], [address] Phone: [phone]   Fax:  [phone]  Name: Susan Marks MRN: [id number] Date of Birth: Jul 04, 1925

## 2017-02-21 NOTE — Patient Instructions (Signed)
  HIP FLEXOR STRETCH 2  While lying on a table or high bed, let the affected leg lower towards the floor until a stretch is felt along the front of your thigh.   At the same time, grasp your opposite knee (under the knee) and pull it towards your chest.       3x on each side, hold 30 sec      [facility] [address] [place], [place] [address] Phone # [phone] Fax [phone]

## 2017-02-23 ENCOUNTER — Ambulatory Visit: Payer: PPO | Admitting: Physical Therapy

## 2017-02-23 DIAGNOSIS — R262 Difficulty in walking, not elsewhere classified: Secondary | ICD-10-CM

## 2017-02-23 DIAGNOSIS — M25551 Pain in right hip: Secondary | ICD-10-CM | POA: Diagnosis not present

## 2017-02-23 DIAGNOSIS — M6281 Muscle weakness (generalized): Secondary | ICD-10-CM

## 2017-02-23 DIAGNOSIS — R2681 Unsteadiness on feet: Secondary | ICD-10-CM

## 2017-02-23 DIAGNOSIS — M25511 Pain in right shoulder: Secondary | ICD-10-CM

## 2017-02-23 NOTE — Therapy (Signed)
[facility] [address], [place], [place], [address] Phone: [phone]   Fax:  [phone]  Physical Therapy Treatment  Patient Details  Name: Susan Marks MRN: [id number] Date of Birth: 1925/06/07 Referring Provider: Levy Pupa, PA-C   Encounter Date: 02/23/2017  PT End of Session - 02/23/17 0942    Visit Number  5    Number of Visits  17    Date for PT Re-Evaluation  04/03/17    Authorization Type  [employer]     Authorization Time Period  02/06/17 to 04/03/17    PT Start Time  0934    PT Stop Time  1013    PT Time Calculation (min)  39 min    Activity Tolerance  Patient tolerated treatment well;No increased pain    Behavior During Therapy  WFL for tasks assessed/performed       Past Medical History:  Diagnosis Date  . Chronic headaches    migraines  . High blood pressure   . High cholesterol     Past Surgical History:  Procedure Laterality Date  . VESICOVAGINAL FISTULA CLOSURE W/ TAH      There were no vitals filed for this visit.  Subjective Assessment - 02/23/17 0936    Subjective  Pt states that things are going well today, but yesterday her Rt hip was really bothering her when she got up from her nap. She has no pain currently.     Currently in Pain?  No/denies                      [facility] Adult PT Treatment/Exercise - 02/23/17 0001      Knee/Hip Exercises: Stretches   Hip Flexor Stretch  30 seconds;2 reps    Hip Flexor Stretch Limitations  thomas position       Knee/Hip Exercises: Standing   Other Standing Knee Exercises  standing weight shift Lt/Rt and ant/post 2x30 sec on foam pad     Other Standing Knee Exercises  NBOS with trunk rotation Lt/Rt x10 reps       Knee/Hip Exercises: Seated   Other Seated Knee/Hip Exercises  ball squeeze x15 reps, 3 sec hold     Hamstring Curl  Both;2 sets;10 reps    Hamstring Limitations  double red TB      Manual Therapy    Soft tissue mobilization  STM Rt glute/piriformis             PT Education - 02/23/17 0944    Education provided  Yes    Education Details  reviewed HEP; factors of strength/flexibility/etc that can impact balance and steadiness with activity     Person(s) Educated  Patient    Methods  Explanation    Comprehension  Verbalized understanding       PT Short Term Goals - 02/23/17 1015      PT SHORT TERM GOAL #1   Title  Pt will demo consistency and independence with her HEP to improve pain and strength of the Rt hip and shoulder.     Time  3    Period  Weeks    Status  Achieved      PT SHORT TERM GOAL #2   Title  Pt will report atleast 25% improvement in Rt shoulder and Rt hip pain from the start of therapy.     Time  4    Period  Weeks    Status  Achieved  PT Long Term Goals - 02/06/17 1319      PT LONG TERM GOAL #1   Title  Pt will demo improved BLE strength to atleast 4/5MMT, and Rt shoulder strength to 4+/5 MMT which will increase her safety and efficiency with daily activity.     Time  8    Period  Weeks    Status  New      PT LONG TERM GOAL #2   Title  Pt will report atleast 50% resolution in her Rt shoulder and hip discomfort with activity.     Time  8    Period  Weeks    Status  New      PT LONG TERM GOAL #3   Title  Pt will demo improved functional strength evident by her ability to complete 5x sit to stand in less tan 13 sec and without noted knee valgus deviation or use of UE for support.     Time  8    Period  Weeks    Status  New      PT LONG TERM GOAL #4   Title  Pt will report improved stability evident by her report of no LOB from the start of therapy, to decrease her risk of falling and causing injury.    Time  8    Period  Weeks    Status  New      PT LONG TERM GOAL #5   Title  Pt will demo understanding and independence with her advanced HEP as well as verbalize understanding of the importance of continued adherence in order to  prevent decline in her status.     Time  8    Period  Weeks    Status  New            Plan - 02/23/17 1012    Clinical Impression Statement  Pt arrived with some confusion over her HEP addition last session. Therapist was able to address this, noting pt was able to set up and complete without much difficulty. Continued this session with addition of standing balance activity to improve pt's steadiness and control and ended with soft tissue mobilization to address noted spasm throughout the Rt gluteal region. Pt was encouraged to complete stretches more frequently and use heat application to decrease spasm and improve pain. She verbalized understanding of this. Ended without any report of pain.     Rehab Potential  Good    PT Frequency  2x / week    PT Duration  8 weeks    PT Treatment/Interventions  ADLs/Self Care Home Management;Cryotherapy;Electrical Stimulation;Moist Heat;Iontophoresis 4mg /ml Dexamethasone;Therapeutic exercise;Therapeutic activities;Functional mobility training;Stair training;Gait training;Balance training;Neuromuscular re-education;Patient/family education;Taping;Manual techniques;Passive range of motion;Dry needling;Vestibular    PT Next Visit Plan  progression of hip strength/flexibility; modalities and manual techniques to address muscle spasm of the shoulder/hip       Patient will benefit from skilled therapeutic intervention in order to improve the following deficits and impairments:  Abnormal gait, Decreased activity tolerance, Decreased balance, Difficulty walking, Decreased endurance, Decreased strength, Impaired UE functional use, Increased muscle spasms, Postural dysfunction, Pain, Decreased mobility  Visit Diagnosis: Pain in right hip  Muscle weakness (generalized)  Unsteadiness on feet  Difficulty walking  Acute pain of right shoulder     Problem List Patient Active Problem List   Diagnosis Date Noted  . Chest pain 03/10/2014  . Benign  essential HTN 03/10/2014  . Abnormal EKG 03/10/2014  . GERD (gastroesophageal reflux disease) 07/07/2013  .  Granulomatous lung disease ([place]) 07/07/2013  . Cough 06/13/2013   10:16 AM,02/23/17 Elly Modena PT, DPT [place] [facility] [place]  [facility] [address] [place], [place], [address] Phone: [phone]   Fax:  [phone]  Name: MADOLYN ACKROYD MRN: [id number] Date of Birth: 02/08/1926

## 2017-02-27 ENCOUNTER — Encounter: Payer: PPO | Admitting: Physical Therapy

## 2017-03-01 ENCOUNTER — Ambulatory Visit: Payer: PPO | Admitting: Physical Therapy

## 2017-03-01 ENCOUNTER — Encounter: Payer: Self-pay | Admitting: Physical Therapy

## 2017-03-01 DIAGNOSIS — M25511 Pain in right shoulder: Secondary | ICD-10-CM

## 2017-03-01 DIAGNOSIS — M25551 Pain in right hip: Secondary | ICD-10-CM

## 2017-03-01 DIAGNOSIS — R262 Difficulty in walking, not elsewhere classified: Secondary | ICD-10-CM

## 2017-03-01 DIAGNOSIS — R2681 Unsteadiness on feet: Secondary | ICD-10-CM

## 2017-03-01 DIAGNOSIS — M6281 Muscle weakness (generalized): Secondary | ICD-10-CM

## 2017-03-01 NOTE — Therapy (Signed)
[facility] [address], [place], [place], [address] Phone: [phone]   Fax:  [phone]  Physical Therapy Treatment  Patient Details  Name: Susan Marks MRN: [id number] Date of Birth: 1926-01-21 Referring Provider: Levy Pupa, PA-C   Encounter Date: 03/01/2017  PT End of Session - 03/01/17 1046    Visit Number  6    Number of Visits  17    Date for PT Re-Evaluation  04/03/17    Authorization Type  [employer]     Authorization Time Period  02/06/17 to 04/03/17    PT Start Time  1022    PT Stop Time  1100    PT Time Calculation (min)  38 min    Activity Tolerance  Patient tolerated treatment well;No increased pain    Behavior During Therapy  WFL for tasks assessed/performed       Past Medical History:  Diagnosis Date  . Chronic headaches    migraines  . High blood pressure   . High cholesterol     Past Surgical History:  Procedure Laterality Date  . VESICOVAGINAL FISTULA CLOSURE W/ TAH      There were no vitals filed for this visit.  Subjective Assessment - 03/01/17 1023    Subjective  Pt reports that things are going well. The ice helped alot with the pain in her hip. She is doing good today and reports no pain.    Currently in Pain?  No/denies          [facility] Adult PT Treatment/Exercise - 03/01/17 0001      Knee/Hip Exercises: Stretches   Piriformis Stretch  3 reps;Both;30 seconds figure 4       Knee/Hip Exercises: Standing   Hip Abduction  Both;1 set;15 reps;Knee straight    Abduction Limitations  slide on floor     Other Standing Knee Exercises  tandem stance with each LE forward 2x30 sec CGA       Manual Therapy   Soft tissue mobilization  STM Rt glute max, glute medius, Rt proximal and mid vastus lateralis              PT Education - 03/01/17 1048    Education provided  Yes    Education Details  use of ice at home 15-20 min at a time; pillow between knees  during sleep    Person(s) Educated  Patient    Methods  Explanation    Comprehension  Verbalized understanding       PT Short Term Goals - 02/23/17 1015      PT SHORT TERM GOAL #1   Title  Pt will demo consistency and independence with her HEP to improve pain and strength of the Rt hip and shoulder.     Time  3    Period  Weeks    Status  Achieved      PT SHORT TERM GOAL #2   Title  Pt will report atleast 25% improvement in Rt shoulder and Rt hip pain from the start of therapy.     Time  4    Period  Weeks    Status  Achieved        PT Long Term Goals - 02/06/17 1319      PT LONG TERM GOAL #1   Title  Pt will demo improved BLE strength to atleast 4/5MMT, and Rt shoulder strength to 4+/5 MMT which will increase her safety and efficiency with daily activity.  Time  8    Period  Weeks    Status  New      PT LONG TERM GOAL #2   Title  Pt will report atleast 50% resolution in her Rt shoulder and hip discomfort with activity.     Time  8    Period  Weeks    Status  New      PT LONG TERM GOAL #3   Title  Pt will demo improved functional strength evident by her ability to complete 5x sit to stand in less tan 13 sec and without noted knee valgus deviation or use of UE for support.     Time  8    Period  Weeks    Status  New      PT LONG TERM GOAL #4   Title  Pt will report improved stability evident by her report of no LOB from the start of therapy, to decrease her risk of falling and causing injury.    Time  8    Period  Weeks    Status  New      PT LONG TERM GOAL #5   Title  Pt will demo understanding and independence with her advanced HEP as well as verbalize understanding of the importance of continued adherence in order to prevent decline in her status.     Time  8    Period  Weeks    Status  New            Plan - 03/01/17 1100    Clinical Impression Statement  Pt arrived today reporting significant improvements in her Rt hip pain with the use of ice at  home. She continues to complete her HEP as instructed. Therapist educated pt on pillow adjustments for home in order to improve position of the hip. Also completed manual treatment to the Rt hip musculature with decreased muscle tenderness and spasm noted following. Pt does note recent increase in Rt shoulder pain with sleeping on that side and we will consider looking further into this at her next session. Ended without any reports of increase in hip/low back pain.     Rehab Potential  Good    PT Frequency  2x / week    PT Duration  8 weeks    PT Treatment/Interventions  ADLs/Self Care Home Management;Cryotherapy;Electrical Stimulation;Moist Heat;Iontophoresis 4mg /ml Dexamethasone;Therapeutic exercise;Therapeutic activities;Functional mobility training;Stair training;Gait training;Balance training;Neuromuscular re-education;Patient/family education;Taping;Manual techniques;Passive range of motion;Dry needling;Vestibular    PT Next Visit Plan  f/u on pillow between knees; look at Rt shoulder pain; progression of hip strength/flexibility; modalities and manual techniques to address muscle spasm of the shoulder/hip       Patient will benefit from skilled therapeutic intervention in order to improve the following deficits and impairments:  Abnormal gait, Decreased activity tolerance, Decreased balance, Difficulty walking, Decreased endurance, Decreased strength, Impaired UE functional use, Increased muscle spasms, Postural dysfunction, Pain, Decreased mobility  Visit Diagnosis: Pain in right hip  Muscle weakness (generalized)  Unsteadiness on feet  Difficulty walking  Acute pain of right shoulder     Problem List Patient Active Problem List   Diagnosis Date Noted  . Chest pain 03/10/2014  . Benign essential HTN 03/10/2014  . Abnormal EKG 03/10/2014  . GERD (gastroesophageal reflux disease) 07/07/2013  . Granulomatous lung disease ([place]) 07/07/2013  . Cough 06/13/2013   11:04  AM,03/01/17 Elly Modena PT, DPT [place] [facility] [address] [place], [place], [address] Phone: [phone]   Fax:  [phone]  Name: Susan Marks MRN: [id number] Date of Birth: Nov 15, 1925

## 2017-03-06 ENCOUNTER — Ambulatory Visit: Payer: PPO | Admitting: Physical Therapy

## 2017-03-06 DIAGNOSIS — R2681 Unsteadiness on feet: Secondary | ICD-10-CM

## 2017-03-06 DIAGNOSIS — M25551 Pain in right hip: Secondary | ICD-10-CM

## 2017-03-06 DIAGNOSIS — M25511 Pain in right shoulder: Secondary | ICD-10-CM

## 2017-03-06 DIAGNOSIS — R262 Difficulty in walking, not elsewhere classified: Secondary | ICD-10-CM

## 2017-03-06 DIAGNOSIS — M6281 Muscle weakness (generalized): Secondary | ICD-10-CM

## 2017-03-06 NOTE — Therapy (Signed)
[facility] [address], [place], [place], [address] Phone: [phone]   Fax:  [phone]  Physical Therapy Treatment  Patient Details  Name: Susan Marks MRN: [id number] Date of Birth: 1925/12/13 Referring Provider: Levy Pupa, PA-C   Encounter Date: 03/06/2017  PT End of Session - 03/06/17 0945    Visit Number  7    Number of Visits  17    Date for PT Re-Evaluation  04/03/17    Authorization Type  [employer]     Authorization Time Period  02/06/17 to 04/03/17    PT Start Time  0941    PT Stop Time  1015    PT Time Calculation (min)  34 min    Activity Tolerance  Patient tolerated treatment well;No increased pain    Behavior During Therapy  WFL for tasks assessed/performed       Past Medical History:  Diagnosis Date  . Chronic headaches    migraines  . High blood pressure   . High cholesterol     Past Surgical History:  Procedure Laterality Date  . VESICOVAGINAL FISTULA CLOSURE W/ TAH      There were no vitals filed for this visit.  Subjective Assessment - 03/06/17 0943    Subjective  Pt reports that today her low back is bothering her some today. She has tried thinking about when her shoulder bothers her most. It seems to be when she pushes up with her hands and ranches up/behind her. She has no shoulder pain currently. She is more tired after she finishes her exercises at home compared to when she completes them at home.     Currently in Pain?  No/denies no pain right now while sitting.          [facility] PT Assessment - 03/06/17 0001      Strength   Left Shoulder Internal Rotation  -- pain free    Left Shoulder External Rotation  -- (+) pain with resistance       Palpation   Palpation comment  Tender along Rt bicep/anterior deltoid/Rt teres mino/major                  [facility] Adult PT Treatment/Exercise - 03/06/17 0001      Exercises   Exercises  Shoulder      Shoulder Exercises: Seated   Retraction  15 reps;Both      Shoulder Exercises: Standing   Flexion  15 reps;Right;AAROM    Flexion Limitations  UE ranger with therapist providing scapular assistance into upward rotation      Shoulder Exercises: Stretch   Cross Chest Stretch  3 reps;20 seconds    Cross Chest Stretch Limitations  RUE      Manual Therapy   Manual Therapy  Myofascial release    Myofascial Release  Rt teres minor/major during passive stretch     Passive ROM  Rt shoulder flexion/abduction             PT Education - 03/06/17 1015    Education provided  Yes    Education Details  use of heat to posterior shoulder; related Rt shoulder symptoms with pt reports of chronic shoulder issues    Person(s) Educated  Patient    Methods  Explanation;Handout    Comprehension  Verbalized understanding;Returned demonstration       PT Short Term Goals - 02/23/17 1015      PT SHORT TERM GOAL #1   Title  Pt  will demo consistency and independence with her HEP to improve pain and strength of the Rt hip and shoulder.     Time  3    Period  Weeks    Status  Achieved      PT SHORT TERM GOAL #2   Title  Pt will report atleast 25% improvement in Rt shoulder and Rt hip pain from the start of therapy.     Time  4    Period  Weeks    Status  Achieved        PT Long Term Goals - 03/06/17 1021      PT LONG TERM GOAL #1   Title  Pt will demo improved BLE strength to atleast 4/5MMT, and Rt shoulder strength to 4+/5 MMT which will increase her safety and efficiency with daily activity.     Time  8    Period  Weeks    Status  On-going      PT LONG TERM GOAL #2   Title  Pt will report atleast 50% resolution in her Rt shoulder and hip discomfort with activity.     Time  8    Period  Weeks    Status  On-going      PT LONG TERM GOAL #3   Title  Pt will demo improved functional strength evident by her ability to complete 5x sit to stand in less tan 13 sec and without noted knee  valgus deviation or use of UE for support.     Time  8    Period  Weeks    Status  On-going      PT LONG TERM GOAL #4   Title  Pt will report improved stability evident by her report of no LOB from the start of therapy, to decrease her risk of falling and causing injury.    Time  8    Period  Weeks    Status  On-going      PT LONG TERM GOAL #5   Title  Pt will demo understanding and independence with her advanced HEP as well as verbalize understanding of the importance of continued adherence in order to prevent decline in her status.     Time  8    Period  Weeks    Status  On-going            Plan - 03/06/17 1016    Clinical Impression Statement  Pt arrived today requesting to focus primarily on her shoulder pain/weakness. Pt does demonstrate limitations in shoulder mobility and strength with tenderness along the posterior shoulder girdle in addition to palpable trigger points in the area as well. Focused on manual treatments to decrease this in addition to therex for improved postural strength and flexibility of the shoulder. Pt was able to complete all exercises with proper technique, reporting no increase in her pain following the session.      Rehab Potential  Good    PT Frequency  2x / week    PT Duration  8 weeks    PT Treatment/Interventions  ADLs/Self Care Home Management;Cryotherapy;Electrical Stimulation;Moist Heat;Iontophoresis 4mg /ml Dexamethasone;Therapeutic exercise;Therapeutic activities;Functional mobility training;Stair training;Gait training;Balance training;Neuromuscular re-education;Patient/family education;Taping;Manual techniques;Passive range of motion;Dry needling;Vestibular    PT Next Visit Plan  F/u on Rt shoulder pain; progression of hip strength/flexibility; modalities and manual techniques to address muscle spasm of the shoulder/hip    Consulted and Agree with Plan of Care  Patient       Patient will benefit from  skilled therapeutic intervention in  order to improve the following deficits and impairments:  Abnormal gait, Decreased activity tolerance, Decreased balance, Difficulty walking, Decreased endurance, Decreased strength, Impaired UE functional use, Increased muscle spasms, Postural dysfunction, Pain, Decreased mobility  Visit Diagnosis: Pain in right hip  Muscle weakness (generalized)  Unsteadiness on feet  Difficulty walking  Acute pain of right shoulder     Problem List Patient Active Problem List   Diagnosis Date Noted  . Chest pain 03/10/2014  . Benign essential HTN 03/10/2014  . Abnormal EKG 03/10/2014  . GERD (gastroesophageal reflux disease) 07/07/2013  . Granulomatous lung disease ([place]) 07/07/2013  . Cough 06/13/2013    10:23 AM,03/06/17 Elly Modena PT, DPT [facility] [address] [place], [place], [address] Phone: [phone]   Fax:  [phone]  Name: THOMASENA VANDENHEUVEL MRN: [id number] Date of Birth: 1926-01-01

## 2017-03-06 NOTE — Patient Instructions (Signed)
  SCAPULAR RETRACTIONS  Draw your shoulder blades back and down.  x15 reps     Posteror capsule cross body shoulder  Stretch extended arm across body. Hold 20 sec, repeat 5x.     Moist Heat Pack  Apply Moist Heat Pack to the affected area. x15 min.     [facility] [address], [place], [place] [address] Phone # [phone] Fax [phone]

## 2017-03-08 ENCOUNTER — Ambulatory Visit: Payer: PPO | Admitting: Cardiology

## 2017-03-08 ENCOUNTER — Encounter: Payer: PPO | Admitting: Physical Therapy

## 2017-03-14 ENCOUNTER — Ambulatory Visit: Payer: PPO | Admitting: Physical Therapy

## 2017-03-14 DIAGNOSIS — R2681 Unsteadiness on feet: Secondary | ICD-10-CM

## 2017-03-14 DIAGNOSIS — M25551 Pain in right hip: Secondary | ICD-10-CM

## 2017-03-14 DIAGNOSIS — M25511 Pain in right shoulder: Secondary | ICD-10-CM

## 2017-03-14 DIAGNOSIS — M6281 Muscle weakness (generalized): Secondary | ICD-10-CM

## 2017-03-14 NOTE — Therapy (Addendum)
[facility] [address], [place], [place], [address] Phone: [phone]   Fax:  [phone]  Physical Therapy Treatment  Patient Details  Name: Susan Marks MRN: [id number] Date of Birth: [birth year] Referring Provider: Levy Pupa, PA-C   Encounter Date: 03/14/2017  PT End of Session - 03/14/17 1459    Visit Number  8    Number of Visits  17    Date for PT Re-Evaluation  04/03/17    PT Start Time  3559 pt arrived late    PT Stop Time  7416 MHP last 12 min     PT Time Calculation (min)  64 min       Past Medical History:  Diagnosis Date  . Chronic headaches    migraines  . High blood pressure   . High cholesterol     Past Surgical History:  Procedure Laterality Date  . VESICOVAGINAL FISTULA CLOSURE W/ TAH      There were no vitals filed for this visit.  Subjective Assessment - 03/14/17 1455    Subjective  Pt reports some stomach discomfort due to eating something wrong.  "I overdid for 2 days due to holidays".  No new changes since last visit.     Patient Stated Goals  improve pain     Currently in Pain?  No/denies no pain with sitting.        [facility] Adult PT Treatment/Exercise - 03/14/17 0001      Lumbar Exercises: Supine   Ab Set  10 reps;5 seconds mulitple cues for technique    Bent Knee Raise  10 reps;5 seconds with ab set      Knee/Hip Exercises: Stretches   Piriformis Stretch  3 reps;Both;30 seconds figure 4     Other Knee/Hip Stretches  SKTC x 30 sec x 2 reps each leg    Other Knee/Hip Stretches  fig4 position gentle self press into ER 2x30"      Knee/Hip Exercises: Aerobic   Nustep  L1: 5 min (arms/legs)      Knee/Hip Exercises: Supine   Other Supine Knee/Hip Exercises  single leg clam with green band around thighs x 10 each side.       Shoulder Exercises: Seated   Retraction  Strengthening;Both;5 reps 5 sec hold    Row  Strengthening;Both;10 reps;Theraband    Theraband Level  (Shoulder Row)  Level 2 (Red)    Other Seated Exercises  scap retraction with arms in L shape (ER with 5 sec hold) x 10 reps; then arms in W pattern with 5 sec hold x 10 reps       Shoulder Exercises: Stretch   Other Shoulder Stretches      Modalities   Modalities  Moist Heat      Moist Heat Therapy   Number Minutes Moist Heat  12 Minutes    Moist Heat Location  Hip;Shoulder Rt      Manual Therapy   Soft tissue mobilization  STM to Rt hip and glute; Rt posterior shoulder, including TPR.                 PT Short Term Goals - 02/23/17 1015      PT SHORT TERM GOAL #1   Title  Pt will demo consistency and independence with her HEP to improve pain and strength of the Rt hip and shoulder.     Time  3    Period  Weeks    Status  Achieved  PT SHORT TERM GOAL #2   Title  Pt will report atleast 25% improvement in Rt shoulder and Rt hip pain from the start of therapy.     Time  4    Period  Weeks    Status  Achieved        PT Long Term Goals - 03/06/17 1021      PT LONG TERM GOAL #1   Title  Pt will demo improved BLE strength to atleast 4/5MMT, and Rt shoulder strength to 4+/5 MMT which will increase her safety and efficiency with daily activity.     Time  8    Period  Weeks    Status  On-going      PT LONG TERM GOAL #2   Title  Pt will report atleast 50% resolution in her Rt shoulder and hip discomfort with activity.     Time  8    Period  Weeks    Status  On-going      PT LONG TERM GOAL #3   Title  Pt will demo improved functional strength evident by her ability to complete 5x sit to stand in less tan 13 sec and without noted knee valgus deviation or use of UE for support.     Time  8    Period  Weeks    Status  On-going      PT LONG TERM GOAL #4   Title  Pt will report improved stability evident by her report of no LOB from the start of therapy, to decrease her risk of falling and causing injury.    Time  8    Period  Weeks    Status  On-going      PT  LONG TERM GOAL #5   Title  Pt will demo understanding and independence with her advanced HEP as well as verbalize understanding of the importance of continued adherence in order to prevent decline in her status.     Time  8    Period  Weeks    Status  On-going            Plan - 2017/04/01 1607    Clinical Impression Statement  Pt reporting minimal changes since last visit. Pt tolerated most exercises well, except reported increased Rt shoulder pain in supine with efforts to stretch Rt hip into fig4 position.  Pt reported decreased tightness in shoulder and hip at end of session.  No new goals met this visit.     Rehab Potential  Good    PT Frequency  2x / week    PT Duration  8 weeks    PT Treatment/Interventions  ADLs/Self Care Home Management;Cryotherapy;Electrical Stimulation;Moist Heat;Iontophoresis 75m/ml Dexamethasone;Therapeutic exercise;Therapeutic activities;Functional mobility training;Stair training;Gait training;Balance training;Neuromuscular re-education;Patient/family education;Taping;Manual techniques;Passive range of motion;Dry needling;Vestibular    PT Next Visit Plan  F/u on Rt shoulder pain; progression of hip strength/flexibility; modalities and manual techniques to address muscle spasm of the shoulder/hip. Progress HEP.        Patient will benefit from skilled therapeutic intervention in order to improve the following deficits and impairments:  Abnormal gait, Decreased activity tolerance, Decreased balance, Difficulty walking, Decreased endurance, Decreased strength, Impaired UE functional use, Increased muscle spasms, Postural dysfunction, Pain, Decreased mobility  Visit Diagnosis: Pain in right hip  Muscle weakness (generalized)  Unsteadiness on feet  Acute pain of right shoulder   G-Codes - 101/13/20191653    Functional Assessment Tool Used (Outpatient Only)  --    Functional Limitation  --  Other PT Primary Current Status [phone])  --    Other PT Primary  Goal Status (A1937)  --       Problem List Patient Active Problem List   Diagnosis Date Noted  . Chest pain 03/10/2014  . Benign essential HTN 03/10/2014  . Abnormal EKG 03/10/2014  . GERD (gastroesophageal reflux disease) 07/07/2013  . Granulomatous lung disease ([place]) 07/07/2013  . Cough 06/13/2013   Kerin Perna, PTA 03/14/17 5:21 PM  [facility] [address] [place], [place], [address] Phone: [phone]   Fax:  [phone]  Name: ALYLA PIETILA MRN: [id number] Date of Birth: 1926-03-17

## 2017-03-19 ENCOUNTER — Encounter: Payer: PPO | Admitting: Physical Therapy

## 2017-03-27 ENCOUNTER — Ambulatory Visit: Payer: PPO | Attending: Chiropractic Medicine | Admitting: Physical Therapy

## 2017-03-27 ENCOUNTER — Encounter: Payer: Self-pay | Admitting: Physical Therapy

## 2017-03-27 DIAGNOSIS — M25551 Pain in right hip: Secondary | ICD-10-CM | POA: Insufficient documentation

## 2017-03-27 DIAGNOSIS — M6281 Muscle weakness (generalized): Secondary | ICD-10-CM | POA: Diagnosis not present

## 2017-03-27 DIAGNOSIS — R2681 Unsteadiness on feet: Secondary | ICD-10-CM | POA: Diagnosis not present

## 2017-03-27 DIAGNOSIS — M25511 Pain in right shoulder: Secondary | ICD-10-CM | POA: Diagnosis not present

## 2017-03-27 NOTE — Therapy (Signed)
[facility] [address], [place], [place], [address] Phone: [phone]   Fax:  [phone]  Physical Therapy Treatment  Patient Details  Name: Susan Marks MRN: [id number] Date of Birth: 07-11-1925 Referring Provider: Levy Pupa, PA-C   Encounter Date: 03/27/2017  PT End of Session - 03/27/17 1048    Visit Number  9    Date for PT Re-Evaluation  04/03/17    Authorization Type  [employer]     Authorization Time Period  02/06/17 to 04/03/17    PT Start Time  1015    PT Stop Time  1052 ended due to feeling sick    PT Time Calculation (min)  37 min    Activity Tolerance  Patient tolerated treatment well;Other (comment) feeling queasy    Behavior During Therapy  WFL for tasks assessed/performed       Past Medical History:  Diagnosis Date  . Chronic headaches    migraines  . High blood pressure   . High cholesterol     Past Surgical History:  Procedure Laterality Date  . VESICOVAGINAL FISTULA CLOSURE W/ TAH      There were no vitals filed for this visit.  Subjective Assessment - 03/27/17 1024    Subjective  I am doing my abdominal exercise in standing.  I am have more frequent pain in lumbar area.     Pertinent History  HTN, osteoporosis     Patient Stated Goals  improve pain     Currently in Pain?  Yes    Pain Score  6  high 8/10    Pain Location  Back    Pain Orientation  Lower    Pain Descriptors / Indicators  Aching;Discomfort    Pain Type  Acute pain    Pain Onset  In the past 7 days    Pain Frequency  Intermittent    Aggravating Factors   first thing in the morning, standing for period of time    Pain Relieving Factors  ice    Multiple Pain Sites  No                      [facility] Adult PT Treatment/Exercise - 03/27/17 0001      Lumbar Exercises: Supine   Ab Set  10 reps;5 seconds hands on abdomen    Clam  10 reps red band    Bent Knee Raise  10 reps;5 seconds  with ab set      Knee/Hip Exercises: Stretches   Other Knee/Hip Stretches  SKTC x 30 sec x 2 reps each leg    Other Knee/Hip Stretches  fig4 position gentle self press into ER 2x30"      Knee/Hip Exercises: Aerobic   Nustep  L1: 2.5 min (arms/legs) seat #6; Arms # 8; ended due to feeling queasy               PT Short Term Goals - 02/23/17 1015      PT SHORT TERM GOAL #1   Title  Pt will demo consistency and independence with her HEP to improve pain and strength of the Rt hip and shoulder.     Time  3    Period  Weeks    Status  Achieved      PT SHORT TERM GOAL #2   Title  Pt will report atleast 25% improvement in Rt shoulder and Rt hip pain from the start of therapy.  Time  4    Period  Weeks    Status  Achieved        PT Long Term Goals - 03/27/17 1030      PT LONG TERM GOAL #1   Title  Pt will demo improved BLE strength to atleast 4/5MMT, and Rt shoulder strength to 4+/5 MMT which will increase her safety and efficiency with daily activity.     Time  8    Period  Weeks    Status  On-going      PT LONG TERM GOAL #2   Title  Pt will report atleast 50% resolution in her Rt shoulder and hip discomfort with activity.     Baseline  back pain more frequent    Time  8    Period  Weeks    Status  On-going      PT LONG TERM GOAL #3   Title  Pt will demo improved functional strength evident by her ability to complete 5x sit to stand in less tan 13 sec and without noted knee valgus deviation or use of UE for support.     Time  8    Period  Weeks    Status  On-going      PT LONG TERM GOAL #4   Title  Pt will report improved stability evident by her report of no LOB from the start of therapy, to decrease her risk of falling and causing injury.    Time  8    Period  Weeks    Status  On-going      PT LONG TERM GOAL #5   Title  Pt will demo understanding and independence with her advanced HEP as well as verbalize understanding of the importance of continued  adherence in order to prevent decline in her status.     Baseline  still learning    Time  8    Period  Weeks    Status  On-going            Plan - 03/27/17 1036    Clinical Impression Statement  During exercise patient was monitored for technique and leg cramps.  Patient was on the nustep when she felt queasy so she wanted to end the session and sit in the waiting room till the feeling left her. Patient reported increase in right shoulder pain. Patient reports her pain in her back and around her hips is more frequent but intermittent.  Patient will benefit from skilled therapy to monitor reaction to exericse and progress slowly.    Rehab Potential  Good    PT Frequency  2x / week    PT Duration  8 weeks    PT Treatment/Interventions  ADLs/Self Care Home Management;Cryotherapy;Electrical Stimulation;Moist Heat;Iontophoresis 4mg /ml Dexamethasone;Therapeutic exercise;Therapeutic activities;Functional mobility training;Stair training;Gait training;Balance training;Neuromuscular re-education;Patient/family education;Taping;Manual techniques;Passive range of motion;Dry needling;Vestibular    PT Next Visit Plan  Sees MD on 04/02/2017 therefore write progress note to MD; progress patient slowly with exercise for back and shoulder    PT Home Exercise Plan  progress as needed    Recommended Other Services  MD signed initial note    Consulted and Agree with Plan of Care  Patient       Patient will benefit from skilled therapeutic intervention in order to improve the following deficits and impairments:  Abnormal gait, Decreased activity tolerance, Decreased balance, Difficulty walking, Decreased endurance, Decreased strength, Impaired UE functional use, Increased muscle spasms, Postural dysfunction, Pain, Decreased mobility  Visit  Diagnosis: Pain in right hip  Muscle weakness (generalized)  Unsteadiness on feet  Acute pain of right shoulder     Problem List Patient Active Problem List    Diagnosis Date Noted  . Chest pain 03/10/2014  . Benign essential HTN 03/10/2014  . Abnormal EKG 03/10/2014  . GERD (gastroesophageal reflux disease) 07/07/2013  . Granulomatous lung disease ([place]) 07/07/2013  . Cough 06/13/2013    Earlie Counts, PT 03/27/17 10:59 AM    [facility] [address], [place], [place], [address] Phone: [phone]   Fax:  [phone]  Name: Susan Marks MRN: [id number] Date of Birth: 18-Apr-1925

## 2017-03-29 ENCOUNTER — Encounter: Payer: Self-pay | Admitting: Physical Therapy

## 2017-03-29 ENCOUNTER — Ambulatory Visit: Payer: PPO | Admitting: Physical Therapy

## 2017-03-29 DIAGNOSIS — M6281 Muscle weakness (generalized): Secondary | ICD-10-CM

## 2017-03-29 DIAGNOSIS — R2681 Unsteadiness on feet: Secondary | ICD-10-CM

## 2017-03-29 DIAGNOSIS — M25551 Pain in right hip: Secondary | ICD-10-CM

## 2017-03-29 DIAGNOSIS — M25511 Pain in right shoulder: Secondary | ICD-10-CM

## 2017-03-29 NOTE — Therapy (Signed)
[facility] [address], [place], [place], [address] Phone: [phone]   Fax:  [phone]  Physical Therapy Treatment  Patient Details  Name: Susan Marks MRN: [id number] Date of Birth: 1925-08-25 Referring Provider: Levy Pupa, PA-C   Encounter Date: 03/29/2017  PT End of Session - 03/29/17 1056    Visit Number  10    Date for PT Re-Evaluation  04/03/17    Authorization Type  [employer]     Authorization Time Period  02/06/17 to 04/03/17    PT Start Time  1017    PT Stop Time  1100    PT Time Calculation (min)  43 min    Activity Tolerance  Patient tolerated treatment well;No increased pain    Behavior During Therapy  WFL for tasks assessed/performed       Past Medical History:  Diagnosis Date  . Chronic headaches    migraines  . High blood pressure   . High cholesterol     Past Surgical History:  Procedure Laterality Date  . VESICOVAGINAL FISTULA CLOSURE W/ TAH      There were no vitals filed for this visit.  Subjective Assessment - 03/29/17 1020    Subjective  Pt states she is following up with her referring physician on Monday next week. She does still have pain in her low back/buttock area which is dependent on how active she is throughout the day. She says that standing for too long really aggravates her pain, but sitting will bother it as well (especially in the mornings).     Pertinent History  HTN, osteoporosis     Patient Stated Goals  improve pain     Currently in Pain?  No/denies no pain currently.     Pain Onset  In the past 7 days         [facility] PT Assessment - 03/29/17 0001      Strength   Right Hip Flexion  4/5    Right Hip Extension  3/5    Right Hip ABduction  3+/5    Left Hip Flexion  4/5    Left Hip Extension  3/5    Left Hip ABduction  4/5    Right Knee Flexion  3+/5    Right Knee Extension  5/5    Left Knee Flexion  3+/5    Left Knee Extension  5/5                  [facility] Adult PT Treatment/Exercise - 03/29/17 0001      Knee/Hip Exercises: Seated   Sit to Sand  2 sets;10 reps;without UE support;Other (comment) yellow TB around knees to decrease valgus       Shoulder Exercises: Seated   Retraction  Both;15 reps    Row  15 reps;Theraband    Theraband Level (Shoulder Row)  Level 1 (Yellow)      Shoulder Exercises: Stretch   Cross Chest Stretch  2 reps;30 seconds    Cross Chest Stretch Limitations  RUE only       Manual Therapy   Passive ROM  B quad stretch in prone 3x30 sec each              PT Education - 03/29/17 1050    Education provided  Yes    Education Details  improvements in hip strength; importance of addressing Rt hip/back primarily until consistent gains have been made, then progressing more to shoulder rehab  Person(s) Educated  Patient    Methods  Explanation    Comprehension  Verbalized understanding       PT Short Term Goals - 03/29/17 1052      PT SHORT TERM GOAL #1   Title  Pt will demo consistency and independence with her HEP to improve pain and strength of the Rt hip and shoulder.     Time  3    Period  Weeks    Status  Achieved      PT SHORT TERM GOAL #2   Title  Pt will report atleast 25% improvement in Rt shoulder and Rt hip pain from the start of therapy.     Baseline  Pt unclear whether pain is improved or worse overall.     Time  4    Period  Weeks    Status  Achieved        PT Long Term Goals - 03/29/17 1052      PT LONG TERM GOAL #1   Title  Pt will demo improved BLE strength to atleast 4/5MMT, and Rt shoulder strength to 4+/5 MMT which will increase her safety and efficiency with daily activity.     Baseline  hip strength 4/5 MMT atleast, except     Time  8    Period  Weeks    Status  On-going      PT LONG TERM GOAL #2   Title  Pt will report atleast 50% resolution in her Rt shoulder and hip discomfort with activity.     Baseline  back pain more frequent     Time  8    Period  Weeks    Status  On-going      PT LONG TERM GOAL #3   Title  Pt will demo improved functional strength evident by her ability to complete 5x sit to stand in less tan 13 sec and without noted knee valgus deviation or use of UE for support.     Baseline  12 sec, minimal knee valgus with cuing from therapist    Time  8    Period  Weeks    Status  On-going      PT LONG TERM GOAL #4   Title  Pt will report improved stability evident by her report of no LOB from the start of therapy, to decrease her risk of falling and causing injury.    Time  8    Period  Weeks    Status  On-going      PT LONG TERM GOAL #5   Title  Pt will demo understanding and independence with her advanced HEP as well as verbalize understanding of the importance of continued adherence in order to prevent decline in her status.     Baseline  still learning    Time  8    Period  Weeks    Status  On-going            Plan - 03/29/17 1058    Clinical Impression Statement  Pt continues to have Rt buttock/hip pain that is typically worse in the morning and by the end of the day. Pt's strength in her LEs has improved overall and her functional strength and control with sit to stand have improved as well, as she was able to complete the 5x sit to stand test in less than 13 sec. Pt does not have significant shoulder pain and finds it difficult to pinpoint specific movements that irritate her arm,  however she will occasionally note that his is still an issue for her. Pt does require frequent review of HEP technique and reinforcement of topics discussed and because of this, a majority of her time in therapy has been focused on the Rt hip/buttock region. Will continue to address limitations in hip/lumbar region and transition to RUE when able.       Rehab Potential  Good    PT Frequency  2x / week    PT Duration  8 weeks    PT Treatment/Interventions  ADLs/Self Care Home Management;Cryotherapy;Electrical  Stimulation;Moist Heat;Iontophoresis 4mg /ml Dexamethasone;Therapeutic exercise;Therapeutic activities;Functional mobility training;Stair training;Gait training;Balance training;Neuromuscular re-education;Patient/family education;Taping;Manual techniques;Passive range of motion;Dry needling;Vestibular    PT Next Visit Plan  slow progression of lumbar ROM. hip strengthening; RUE strengthening as able    PT Home Exercise Plan  progress as needed    Consulted and Agree with Plan of Care  Patient       Patient will benefit from skilled therapeutic intervention in order to improve the following deficits and impairments:  Abnormal gait, Decreased activity tolerance, Decreased balance, Difficulty walking, Decreased endurance, Decreased strength, Impaired UE functional use, Increased muscle spasms, Postural dysfunction, Pain, Decreased mobility  Visit Diagnosis: Pain in right hip  Muscle weakness (generalized)  Unsteadiness on feet  Acute pain of right shoulder     Problem List Patient Active Problem List   Diagnosis Date Noted  . Chest pain 03/10/2014  . Benign essential HTN 03/10/2014  . Abnormal EKG 03/10/2014  . GERD (gastroesophageal reflux disease) 07/07/2013  . Granulomatous lung disease ([place]) 07/07/2013  . Cough 06/13/2013    11:42 AM,03/29/17 Elly Modena PT, DPT [facility] [address] [place], [place], [address] Phone: [phone]   Fax:  [phone]  Name: Susan Marks MRN: [id number] Date of Birth: 1925/10/27

## 2017-04-02 DIAGNOSIS — M25511 Pain in right shoulder: Secondary | ICD-10-CM | POA: Diagnosis not present

## 2017-04-02 DIAGNOSIS — M4316 Spondylolisthesis, lumbar region: Secondary | ICD-10-CM | POA: Diagnosis not present

## 2017-04-02 DIAGNOSIS — M5136 Other intervertebral disc degeneration, lumbar region: Secondary | ICD-10-CM | POA: Diagnosis not present

## 2017-04-03 ENCOUNTER — Ambulatory Visit: Payer: PPO | Admitting: Physical Therapy

## 2017-04-03 ENCOUNTER — Encounter: Payer: Self-pay | Admitting: Physical Therapy

## 2017-04-03 DIAGNOSIS — M6281 Muscle weakness (generalized): Secondary | ICD-10-CM

## 2017-04-03 DIAGNOSIS — M25551 Pain in right hip: Secondary | ICD-10-CM

## 2017-04-03 DIAGNOSIS — M25511 Pain in right shoulder: Secondary | ICD-10-CM

## 2017-04-03 DIAGNOSIS — R2681 Unsteadiness on feet: Secondary | ICD-10-CM

## 2017-04-03 NOTE — Therapy (Signed)
[facility] [address], [place], [place], [address] Phone: [phone]   Fax:  [phone]  Physical Therapy Treatment/Reassessment  Patient Details  Name: Susan MARTENEY MRN: [id number] Date of Birth: 1926-02-26 Referring Provider: Levy Pupa, PA-C   Encounter Date: 04/03/2017  PT End of Session - 04/03/17 1058    Visit Number  11    Date for PT Re-Evaluation  05/16/17    Authorization Type  [employer]     Authorization Time Period  04/04/17 to 05/16/17    PT Start Time  1015    PT Stop Time  1100    PT Time Calculation (min)  45 min    Activity Tolerance  Patient tolerated treatment well;No increased pain    Behavior During Therapy  WFL for tasks assessed/performed       Past Medical History:  Diagnosis Date  . Chronic headaches    migraines  . High blood pressure   . High cholesterol     Past Surgical History:  Procedure Laterality Date  . VESICOVAGINAL FISTULA CLOSURE W/ TAH      There were no vitals filed for this visit.  Subjective Assessment - 04/03/17 1019    Subjective  Pt states that she saw her doctor yesterday. She is considering getting injections in her low back. She continues to complete her exercises, especially for the shoulder, and thinks that she is doing them much better overall. Her Rt shoulder will bother her occasionally, but driving here she didn't even notice her Rt hip/back pain like she usually does. She also notes that it hasn't been as bad overall, maybe 50% improved overall.     Pertinent History  HTN, osteoporosis     Patient Stated Goals  improve pain     Currently in Pain?  No/denies    Pain Onset  In the past 7 days         [facility] PT Assessment - 04/03/17 0001      Assessment   Medical Diagnosis  Rt shoulder and Rt hip pain     Referring Provider  Levy Pupa, PA-C    Onset Date/Surgical Date  -- ~1 week ago     Hand Dominance  Right    Prior  Therapy  for general conditioning       Precautions   Precautions  Other (comment) osteporosis       [place] residence    Living Arrangements  Spouse/significant other    Additional Comments  3 STE       Prior Function   Level of Independence  Independent      Sensation   Light Touch  Appears Intact      Posture/Postural Control   Posture Comments  Forward head, rounded shoulders       Strength   Right Shoulder Flexion  4/5    Right Shoulder ABduction  3+/5    Right Shoulder Internal Rotation  5/5    Right Shoulder External Rotation  4-/5 (+) pain     Left Shoulder Flexion  4/5    Left Shoulder ABduction  3+/5    Left Shoulder Internal Rotation  5/5    Left Shoulder External Rotation  4+/5    Right Hip Flexion  4/5    Right Hip Extension  3/5    Right Hip ABduction  3+/5    Left Hip Flexion  4/5    Left Hip Extension  3/5    Left Hip ABduction  4/5    Right Knee Flexion  4/5    Right Knee Extension  5/5    Left Knee Flexion  4/5    Left Knee Extension  5/5      Flexibility   Soft Tissue Assessment /Muscle Length  yes    Hamstrings  WNL      Palpation   Palpation comment  tenderness Rt teres minor/major/infraspinatus/anterior deltoid and bicep; Rt hip tender over greater trochanter/glute med      Transfers   Five time sit to stand comments   13 sec, UE on thighs, hip adduction  last session      Standardized Balance Assessment   Standardized Balance Assessment  --      Timed Up and Go Test   TUG Comments  --      High Level Balance   High Level Balance Comments  --           [facility] Adult PT Treatment/Exercise - 04/03/17 0001      Ambulation/Gait   Gait Comments  gait training without SPC x20 ft, 6 rounds with therapist feedback and instruction to increased speed. Pt able to complete without report of hip/low back pain.       Knee/Hip Exercises: Supine   Bridges  Both;10 reps;1 set    Other Supine Knee/Hip  Exercises  low trunk rotation x10 reps Lt/Rt              PT Education - 04/03/17 1032    Education provided  Yes    Education Details  answered pt questions regarding injections, goals and progress in rehab, importance of of increasing strength in hips/trunk to increase activity tolerance; importance of finding safe/pain free movements for pt to complete throughout the day. Setting weekly walking goals    Person(s) Educated  Patient    Methods  Explanation    Comprehension  Verbalized understanding       PT Short Term Goals - 04/03/17 1151      PT SHORT TERM GOAL #1   Title  Pt will demo consistency and independence with her HEP to improve pain and strength of the Rt hip and shoulder.     Time  3    Period  Weeks    Status  Achieved      PT SHORT TERM GOAL #2   Title  Pt will report atleast 25% improvement in Rt shoulder and Rt hip pain from the start of therapy.     Baseline  Pt unclear whether pain is improved or worse overall.     Time  4    Period  Weeks    Status  Achieved        PT Long Term Goals - 04/03/17 1151      PT LONG TERM GOAL #1   Title  Pt will demo improved BLE strength to atleast 4/5MMT, and Rt shoulder strength to 4+/5 MMT which will increase her safety and efficiency with daily activity.     Baseline  hip strength 4/5 MMT atleast, except     Time  6    Period  Weeks    Status  On-going    Target Date  05/16/17      PT LONG TERM GOAL #2   Title  Pt will report atleast 50% resolution in her Rt shoulder and hip discomfort with activity.     Baseline  back pain more frequent, but improved 50%;  shoulder unable to say    Time  6    Period  Weeks    Status  On-going      PT LONG TERM GOAL #3   Title  Pt will demo improved functional strength evident by her ability to complete 5x sit to stand in less tan 13 sec and without noted knee valgus deviation or use of UE for support.     Baseline  12 sec, minimal knee valgus with cuing from therapist     Time  6    Period  Weeks    Status  Partially Met      PT LONG TERM GOAL #4   Title  Pt will report improved stability evident by her report of no LOB from the start of therapy, to decrease her risk of falling and causing injury.    Baseline  Pt demonstrates no unsteadiness or LOB during sessions, however she does feel that she is overall unsteady    Time  6    Period  Weeks    Status  Partially Met      PT LONG TERM GOAL #5   Title  Pt will demo understanding and independence with her advanced HEP as well as verbalize understanding of the importance of continued adherence in order to prevent decline in her status.     Baseline  still learning, began walking program plan this session    Time  6    Period  Weeks    Status  On-going            Plan - 04/03/17 1154    Clinical Impression Statement  Pt was reassessed this session having met all of her short term goals and making progress towards her long term goals since beginning PT. She demonstrates gradual improvements in LE strength as well as reporting improved low back/hip pain of 50% over the past couple of days. She does have intermittent shoulder pain, however her primary issue has been the low back/buttock area which has recently been improving. Pt does require encouragement and heavy education throughout her sessions with both exercise technique and activity completion at home due to recent issues with unsteady gait/falls. She would benefit from continued skilled PT to address her limitations in shoulder/hip/trunk strength and improve her safety and confidence with daily activity around the home and community.     Rehab Potential  Good    PT Frequency  2x / week    PT Duration  8 weeks    PT Treatment/Interventions  ADLs/Self Care Home Management;Cryotherapy;Electrical Stimulation;Moist Heat;Iontophoresis 44m/ml Dexamethasone;Therapeutic exercise;Therapeutic activities;Functional mobility training;Stair training;Gait  training;Balance training;Neuromuscular re-education;Patient/family education;Taping;Manual techniques;Passive range of motion;Dry needling;Vestibular    PT Next Visit Plan  lumbar: low trunk rotation/SKTC/DKTC; hip strengthening (extension/abduction) progress to more functional activity; RUE strengthening as able    PT Home Exercise Plan  low trunk rotation,  clams    Consulted and Agree with Plan of Care  Patient       Patient will benefit from skilled therapeutic intervention in order to improve the following deficits and impairments:  Abnormal gait, Decreased activity tolerance, Decreased balance, Difficulty walking, Decreased endurance, Decreased strength, Impaired UE functional use, Increased muscle spasms, Postural dysfunction, Pain, Decreased mobility  Visit Diagnosis: Pain in right hip  Muscle weakness (generalized)  Unsteadiness on feet  Acute pain of right shoulder     Problem List Patient Active Problem List   Diagnosis Date Noted  . Chest pain 03/10/2014  . Benign essential  HTN 03/10/2014  . Abnormal EKG 03/10/2014  . GERD (gastroesophageal reflux disease) 07/07/2013  . Granulomatous lung disease ([place]) 07/07/2013  . Cough 06/13/2013    1:54 PM,04/03/17 [place], DPT [place] [facility] [place]  [facility] [address] [place], [place], [address] Phone: [phone]   Fax:  [phone]  Name: PIERCE BIAGINI MRN: [id number] Date of Birth: [birth year]

## 2017-04-03 NOTE — Patient Instructions (Signed)
   LOWER TRUNK ROTATIONS - LTR  Lying on your back with your knees bent, gently rock your knees side-to-side.     x15 reps.       SUPINE HIP ABDUCTION - ELASTIC BAND CLAMS - [place]  Lie down on your back with your knees bent. Place an elastic band around your knees and then draw your knees apart. x10 reps. One leg at a time.   [facility] [address] [place],  [address] Phone # [phone] Fax [phone]

## 2017-04-05 ENCOUNTER — Ambulatory Visit: Payer: PPO | Admitting: Physical Therapy

## 2017-04-05 ENCOUNTER — Encounter: Payer: Self-pay | Admitting: Physical Therapy

## 2017-04-05 DIAGNOSIS — M6281 Muscle weakness (generalized): Secondary | ICD-10-CM

## 2017-04-05 DIAGNOSIS — M25551 Pain in right hip: Secondary | ICD-10-CM | POA: Diagnosis not present

## 2017-04-05 DIAGNOSIS — M25511 Pain in right shoulder: Secondary | ICD-10-CM

## 2017-04-05 DIAGNOSIS — R2681 Unsteadiness on feet: Secondary | ICD-10-CM

## 2017-04-05 NOTE — Therapy (Signed)
[facility] [address] [place], [place], [address] Phone: [phone]   Fax:  [phone]  Physical Therapy Treatment  Patient Details  Name: Susan Marks MRN: [id number] Date of Birth: Oct 26, 1925 Referring Provider: Levy Pupa, PA-C   Encounter Date: 04/05/2017  PT End of Session - 04/05/17 1605    Visit Number  12    Date for PT Re-Evaluation  05/16/17    Authorization Type  [employer]     Authorization Time Period  04/04/17 to 05/16/17    PT Start Time  1533    PT Stop Time  1605 Ended session early due to pt report of high levels of fatigue     PT Time Calculation (min)  32 min    Activity Tolerance  Patient tolerated treatment well;No increased pain    Behavior During Therapy  WFL for tasks assessed/performed       Past Medical History:  Diagnosis Date  . Chronic headaches    migraines  . High blood pressure   . High cholesterol     Past Surgical History:  Procedure Laterality Date  . VESICOVAGINAL FISTULA CLOSURE W/ TAH      There were no vitals filed for this visit.  Subjective Assessment - 04/05/17 1533    Subjective  Pt reports that she is tired today. She is tired although she hasn't been up and doing anything physical. She did meet a friend for lunch. She has not been working on her walking program consistently.     Pertinent History  HTN, osteoporosis     Patient Stated Goals  improve pain     Currently in Pain?  Yes    Pain Score  5     Pain Location  Buttocks    Pain Orientation  Right    Pain Descriptors / Indicators  Aching;Discomfort    Pain Type  Acute pain    Pain Radiating Towards  none     Pain Onset  More than a month ago    Pain Frequency  Intermittent    Aggravating Factors   first thing in the mornings, after a long day     Pain Relieving Factors  rest     Effect of Pain on Daily Activities  limited activity completion               [facility] Adult  PT Treatment/Exercise - 04/05/17 0001      Knee/Hip Exercises: Stretches   Other Knee/Hip Stretches  SKTC 5x10 sec each LE      Knee/Hip Exercises: Standing   Hip Abduction  Both;1 set;10 reps;Knee straight    Hip Extension  1 set;10 reps;Both      Knee/Hip Exercises: Supine   Other Supine Knee/Hip Exercises  low trunk rotation x10 reps Lt/Rt     Other Supine Knee/Hip Exercises  double knee to chest stretch with LE on red physioball x25 reps, therapist providing assistance              PT Education - 04/05/17 1611    Education provided  Yes    Education Details  importance of completing HEP in order to improve endurance/stamina/strength and improve pain    Person(s) Educated  Patient    Methods  Explanation    Comprehension  Verbalized understanding       PT Short Term Goals - 04/03/17 1151      PT SHORT TERM GOAL #1   Title  Pt will  demo consistency and independence with her HEP to improve pain and strength of the Rt hip and shoulder.     Time  3    Period  Weeks    Status  Achieved      PT SHORT TERM GOAL #2   Title  Pt will report atleast 25% improvement in Rt shoulder and Rt hip pain from the start of therapy.     Baseline  Pt unclear whether pain is improved or worse overall.     Time  4    Period  Weeks    Status  Achieved        PT Long Term Goals - 04/03/17 1151      PT LONG TERM GOAL #1   Title  Pt will demo improved BLE strength to atleast 4/5MMT, and Rt shoulder strength to 4+/5 MMT which will increase her safety and efficiency with daily activity.     Baseline  hip strength 4/5 MMT atleast, except     Time  6    Period  Weeks    Status  On-going    Target Date  05/16/17      PT LONG TERM GOAL #2   Title  Pt will report atleast 50% resolution in her Rt shoulder and hip discomfort with activity.     Baseline  back pain more frequent, but improved 50%; shoulder unable to say    Time  6    Period  Weeks    Status  On-going      PT LONG TERM  GOAL #3   Title  Pt will demo improved functional strength evident by her ability to complete 5x sit to stand in less tan 13 sec and without noted knee valgus deviation or use of UE for support.     Baseline  12 sec, minimal knee valgus with cuing from therapist    Time  6    Period  Weeks    Status  Partially Met      PT LONG TERM GOAL #4   Title  Pt will report improved stability evident by her report of no LOB from the start of therapy, to decrease her risk of falling and causing injury.    Baseline  Pt demonstrates no unsteadiness or LOB during sessions, however she does feel that she is overall unsteady    Time  6    Period  Weeks    Status  Partially Met      PT LONG TERM GOAL #5   Title  Pt will demo understanding and independence with her advanced HEP as well as verbalize understanding of the importance of continued adherence in order to prevent decline in her status.     Baseline  still learning, began walking program plan this session    Time  6    Period  Weeks    Status  On-going            Plan - 04/05/17 1606    Clinical Impression Statement  Pt arrived today with report of overall fatigue and dull aching 5/10 in her Rt low back/buttock area. This remains unchanged from her last session, although she reports having not completed her HEP or walking program as discussed during her last session. Completed gentle lumbar ROM exercises with pt reporting overall decrease in her pain following this. She was unable to give percentage or pain rating of improvement. Therapist discussed importance of pt completing her HEP regularly as instructed and she  verbalized understanding.     Rehab Potential  Good    PT Frequency  2x / week    PT Duration  8 weeks    PT Treatment/Interventions  ADLs/Self Care Home Management;Cryotherapy;Electrical Stimulation;Moist Heat;Iontophoresis 38m/ml Dexamethasone;Therapeutic exercise;Therapeutic activities;Functional mobility training;Stair  training;Gait training;Balance training;Neuromuscular re-education;Patient/family education;Taping;Manual techniques;Passive range of motion;Dry needling;Vestibular    PT Next Visit Plan  conitnue with lumbar: low trunk rotation/SKTC/DKTC; hip strengthening (extension/abduction) progress to more functional activity; Nustep for endurance; RUE strengthening as able    PT Home Exercise Plan  low trunk rotation,  clams, walking     Consulted and Agree with Plan of Care  Patient       Patient will benefit from skilled therapeutic intervention in order to improve the following deficits and impairments:  Abnormal gait, Decreased activity tolerance, Decreased balance, Difficulty walking, Decreased endurance, Decreased strength, Impaired UE functional use, Increased muscle spasms, Postural dysfunction, Pain, Decreased mobility  Visit Diagnosis: Pain in right hip  Muscle weakness (generalized)  Unsteadiness on feet  Acute pain of right shoulder     Problem List Patient Active Problem List   Diagnosis Date Noted  . Chest pain 03/10/2014  . Benign essential HTN 03/10/2014  . Abnormal EKG 03/10/2014  . GERD (gastroesophageal reflux disease) 07/07/2013  . Granulomatous lung disease ([place] 07/07/2013  . Cough 06/13/2013    4:11 PM,04/05/17 SSherol DadePT, DPT [facility] [address] [place] [address]: [phone]  Fax:  [phone] Name: Susan MANUSMRN: [id number] of Birth: [id number]

## 2017-04-09 ENCOUNTER — Ambulatory Visit: Payer: PPO | Admitting: Physical Therapy

## 2017-04-09 ENCOUNTER — Encounter: Payer: Self-pay | Admitting: Physical Therapy

## 2017-04-09 DIAGNOSIS — M25511 Pain in right shoulder: Secondary | ICD-10-CM

## 2017-04-09 DIAGNOSIS — M25551 Pain in right hip: Secondary | ICD-10-CM

## 2017-04-09 DIAGNOSIS — M6281 Muscle weakness (generalized): Secondary | ICD-10-CM

## 2017-04-09 DIAGNOSIS — R2681 Unsteadiness on feet: Secondary | ICD-10-CM

## 2017-04-09 NOTE — Patient Instructions (Signed)
   ADL - LOG ROLL  GETTING IN BED: Start by sitting on the edge of the bed. Next, lower your self down lying on your side using your arms. Once fully on your side, roll onto your back.  When rolling be sure y our knees stay bent and that you roll your whole body together as one unit. Your shoulders, pelvis and knees all roll as one.    GETTING OUT OF BED: Start by bending your knees and then roll onto your side. Reach your arm across your body to initiate the rolling. When rolling, be sure that you roll your whole body together as one unit. Your shoulders, pelvis and knees should all roll together. Once on our side, tip yourself up to sitting using your arms.     [facility] [address], [place], [place] [address] Phone # [phone] Fax [phone]

## 2017-04-09 NOTE — Therapy (Signed)
[facility] [address] [place], [place], [address] Phone: [phone]   Fax:  [phone]  Physical Therapy Treatment  Patient Details  Name: Susan Marks MRN: [id number] Date of Birth: March 08, 1926 Referring Provider: Levy Pupa, PA-C   Encounter Date: 04/09/2017  PT End of Session - 04/09/17 1533    Visit Number  13    Date for PT Re-Evaluation  05/16/17    Authorization Type  [employer]     Authorization Time Period  04/04/17 to 05/16/17    PT Start Time  1448    PT Stop Time  1530    PT Time Calculation (min)  42 min    Activity Tolerance  Patient tolerated treatment well;No increased pain    Behavior During Therapy  WFL for tasks assessed/performed       Past Medical History:  Diagnosis Date  . Chronic headaches    migraines  . High blood pressure   . High cholesterol     Past Surgical History:  Procedure Laterality Date  . VESICOVAGINAL FISTULA CLOSURE W/ TAH      There were no vitals filed for this visit.  Subjective Assessment - 04/09/17 1458    Subjective  Pt reports that she had on and off back pain over the past couple of days. She did her shoulder stretches but did not complete her low back stretches because she thought this would increase her pain.     Pertinent History  HTN, osteoporosis     Patient Stated Goals  improve pain     Currently in Pain?  No/denies No hip pain, just dull low back ache currently     Pain Onset  More than a month ago                      [facility] Adult PT Treatment/Exercise - 04/09/17 0001      Self-Care   Self-Care  Posture    Posture  log roll technique getting in/out of bed x4 trials each direction, pt able to demonstrate proper technique without therapist feedback.       Knee/Hip Exercises: Stretches   Hip Flexor Stretch  30 seconds;2 reps    Hip Flexor Stretch Limitations  supine thomas position       Knee/Hip Exercises:  Seated   Ball Squeeze  15x3 sec hold     Other Seated Knee/Hip Exercises  rows with red TB x10 reps       Knee/Hip Exercises: Supine   Bridges  Both;1 set;10 reps    Other Supine Knee/Hip Exercises  low trunk rotation x10 reps Lt/Rt              PT Education - 04/09/17 1502    Education provided  Yes    Education Details  impact movement has on the joints and improving mobility and encouraged pt to complete HEP that is intended to help with pain/flexibility/strength; log roll technique    Person(s) Educated  Patient    Methods  Explanation;Handout    Comprehension  Verbalized understanding;Returned demonstration       PT Short Term Goals - 04/03/17 1151      PT SHORT TERM GOAL #1   Title  Pt will demo consistency and independence with her HEP to improve pain and strength of the Rt hip and shoulder.     Time  3    Period  Weeks    Status  Achieved  PT SHORT TERM GOAL #2   Title  Pt will report atleast 25% improvement in Rt shoulder and Rt hip pain from the start of therapy.     Baseline  Pt unclear whether pain is improved or worse overall.     Time  4    Period  Weeks    Status  Achieved        PT Long Term Goals - 04/03/17 1151      PT LONG TERM GOAL #1   Title  Pt will demo improved BLE strength to atleast 4/5MMT, and Rt shoulder strength to 4+/5 MMT which will increase her safety and efficiency with daily activity.     Baseline  hip strength 4/5 MMT atleast, except     Time  6    Period  Weeks    Status  On-going    Target Date  05/16/17      PT LONG TERM GOAL #2   Title  Pt will report atleast 50% resolution in her Rt shoulder and hip discomfort with activity.     Baseline  back pain more frequent, but improved 50%; shoulder unable to say    Time  6    Period  Weeks    Status  On-going      PT LONG TERM GOAL #3   Title  Pt will demo improved functional strength evident by her ability to complete 5x sit to stand in less tan 13 sec and without noted  knee valgus deviation or use of UE for support.     Baseline  12 sec, minimal knee valgus with cuing from therapist    Time  6    Period  Weeks    Status  Partially Met      PT LONG TERM GOAL #4   Title  Pt will report improved stability evident by her report of no LOB from the start of therapy, to decrease her risk of falling and causing injury.    Baseline  Pt demonstrates no unsteadiness or LOB during sessions, however she does feel that she is overall unsteady    Time  6    Period  Weeks    Status  Partially Met      PT LONG TERM GOAL #5   Title  Pt will demo understanding and independence with her advanced HEP as well as verbalize understanding of the importance of continued adherence in order to prevent decline in her status.     Baseline  still learning, began walking program plan this session    Time  6    Period  Weeks    Status  On-going            Plan - 04/09/17 1534    Clinical Impression Statement  Pt continues to only complete her HEP sporadically, having on/off low back pain over the past couple of days. Therapist heavily educated pt on the impacts movement has on the joints and encouraged gentle lumbar ROM/stretching during today's session. Also educated pt on proper log roll technique with pt demonstrating good understanding of this without feedback. End of session, pt reported resolved low dull ache that she originally come in reporting.     Rehab Potential  Good    PT Frequency  2x / week    PT Duration  8 weeks    PT Treatment/Interventions  ADLs/Self Care Home Management;Cryotherapy;Electrical Stimulation;Moist Heat;Iontophoresis 45m/ml Dexamethasone;Therapeutic exercise;Therapeutic activities;Functional mobility training;Stair training;Gait training;Balance training;Neuromuscular re-education;Patient/family education;Taping;Manual techniques;Passive range of motion;Dry  needling;Vestibular    PT Next Visit Plan  f/u on HEP/stretching at home; continue with low  trunk rotation/SKTC/DKTC; hip strengthening (extension/abduction) progress to more functional activity; Nustep for endurance; RUE strengthening as able    PT Home Exercise Plan  low trunk rotation,  clams, walking     Consulted and Agree with Plan of Care  Patient       Patient will benefit from skilled therapeutic intervention in order to improve the following deficits and impairments:  Abnormal gait, Decreased activity tolerance, Decreased balance, Difficulty walking, Decreased endurance, Decreased strength, Impaired UE functional use, Increased muscle spasms, Postural dysfunction, Pain, Decreased mobility  Visit Diagnosis: Pain in right hip  Muscle weakness (generalized)  Unsteadiness on feet  Acute pain of right shoulder     Problem List Patient Active Problem List   Diagnosis Date Noted  . Chest pain 03/10/2014  . Benign essential HTN 03/10/2014  . Abnormal EKG 03/10/2014  . GERD (gastroesophageal reflux disease) 07/07/2013  . Granulomatous lung disease ([place]) 07/07/2013  . Cough 06/13/2013    3:39 PM,04/09/17 Sherol Dade PT, DPT [place] [facility] [address] [place], [place], [address] Phone: [phone]   Fax:  [phone]  Name: Susan Marks MRN: [id number] Date of Birth: Oct 09, 1925

## 2017-04-11 ENCOUNTER — Encounter: Payer: Self-pay | Admitting: Physical Therapy

## 2017-04-11 ENCOUNTER — Ambulatory Visit: Payer: PPO | Admitting: Physical Therapy

## 2017-04-11 DIAGNOSIS — R2681 Unsteadiness on feet: Secondary | ICD-10-CM

## 2017-04-11 DIAGNOSIS — M25551 Pain in right hip: Secondary | ICD-10-CM

## 2017-04-11 DIAGNOSIS — M25511 Pain in right shoulder: Secondary | ICD-10-CM

## 2017-04-11 DIAGNOSIS — M6281 Muscle weakness (generalized): Secondary | ICD-10-CM

## 2017-04-11 NOTE — Therapy (Signed)
[facility] [address] [place], [place], [address] Phone: [phone]   Fax:  [phone]  Physical Therapy Treatment  Patient Details  Name: Susan Marks MRN: [id number] Date of Birth: Feb 22, 1926 Referring Provider: Levy Pupa, PA-C   Encounter Date: 04/11/2017  PT End of Session - 04/11/17 1512    Visit Number  14    Date for PT Re-Evaluation  05/16/17    Authorization Type  [employer]     Authorization Time Period  04/04/17 to 05/16/17    PT Start Time  8563    PT Stop Time  1730    PT Time Calculation (min)  40 min    Activity Tolerance  Patient tolerated treatment well;No increased pain    Behavior During Therapy  WFL for tasks assessed/performed       Past Medical History:  Diagnosis Date  . Chronic headaches    migraines  . High blood pressure   . High cholesterol     Past Surgical History:  Procedure Laterality Date  . VESICOVAGINAL FISTULA CLOSURE W/ TAH      There were no vitals filed for this visit.  Subjective Assessment - 04/11/17 1456    Subjective  Pt reports that she completed some of her stretches at home when her back was bothering her and it seemed to actually help. She has no complaints at this time, she is not as tired as she was last time.     Pertinent History  HTN, osteoporosis     Patient Stated Goals  improve pain     Currently in Pain?  No/denies    Pain Onset  More than a month ago              [facility] Adult PT Treatment/Exercise - 04/11/17 0001      Knee/Hip Exercises: Aerobic   Nustep  L2 x5 min therapist present to discuss HEP adherence over past week      Knee/Hip Exercises: Standing   Heel Raises  Both;2 sets;10 reps    Hip Extension  Both;1 set;10 reps;Knee straight;Stengthening    Extension Limitations  forearms resting on countertop       Knee/Hip Exercises: Seated   Sit to Sand  1 set;15 reps;without UE support sitting on foam pad       Knee/Hip Exercises: Supine   Other Supine Knee/Hip Exercises  B knees to chest with LE on red physioball with hterapist assistance x15 reps     Other Supine Knee/Hip Exercises  oblique isometric with alt LE/UE press x10 reps each; abdominal brace with hip extension isometric into table x5 sec hold, 10x each              PT Education - 04/11/17 1528    Education provided  Yes    Education Details  increased walking to 7 min.     Person(s) Educated  Patient    Methods  Explanation    Comprehension  Verbalized understanding       PT Short Term Goals - 04/11/17 1511      PT SHORT TERM GOAL #1   Title  Pt will demo consistency and independence with her HEP to improve pain and strength of the Rt hip and shoulder.     Time  3    Period  Weeks    Status  Achieved      PT SHORT TERM GOAL #2   Title  Pt will report atleast 25%  improvement in Rt shoulder and Rt hip pain from the start of therapy.     Baseline  Pt unclear whether pain is improved or worse overall.     Time  4    Period  Weeks    Status  Achieved        PT Long Term Goals - 04/11/17 1511      PT LONG TERM GOAL #1   Title  Pt will demo improved BLE strength to atleast 4/5MMT, and Rt shoulder strength to 4+/5 MMT which will increase her safety and efficiency with daily activity.     Baseline  hip strength 4/5 MMT atleast, except     Time  6    Period  Weeks    Status  On-going      PT LONG TERM GOAL #2   Title  Pt will report atleast 50% resolution in her Rt shoulder and hip discomfort with activity.     Baseline  back pain more frequent, but improved 50%; shoulder unable to say    Time  6    Period  Weeks    Status  On-going      PT LONG TERM GOAL #3   Title  Pt will demo improved functional strength evident by her ability to complete 5x sit to stand in less tan 13 sec and without noted knee valgus deviation or use of UE for support.     Baseline  12 sec, minimal knee valgus with cuing from therapist     Time  6    Period  Weeks    Status  Partially Met      PT LONG TERM GOAL #4   Title  Pt will report improved stability evident by her report of no LOB from the start of therapy, to decrease her risk of falling and causing injury.    Baseline  Pt demonstrates no unsteadiness or LOB during sessions, however she does feel that she is overall unsteady    Time  6    Period  Weeks    Status  Partially Met      PT LONG TERM GOAL #5   Title  Pt will demo understanding and independence with her advanced HEP as well as verbalize understanding of the importance of continued adherence in order to prevent decline in her status.     Baseline  still learning, began walking program plan this session    Time  6    Period  Weeks    Status  On-going            Plan - 04/11/17 1513    Clinical Impression Statement  Pt arrived today with improved HEP adherence following her last session, and noting improvements in her low back pain following completion of this at home. Pt was able to complete up to 10 minutes of standing exercise this session without report of increase in low back discomfort, which is an improvement from prior sessions. Encouraged increase in walking time at home and pt verbalized agreement with this. Will continue with current POC.    Rehab Potential  Good    PT Frequency  2x / week    PT Duration  8 weeks    PT Treatment/Interventions  ADLs/Self Care Home Management;Cryotherapy;Electrical Stimulation;Moist Heat;Iontophoresis 62m/ml Dexamethasone;Therapeutic exercise;Therapeutic activities;Functional mobility training;Stair training;Gait training;Balance training;Neuromuscular re-education;Patient/family education;Taping;Manual techniques;Passive range of motion;Dry needling;Vestibular    PT Next Visit Plan  f/u on 780m walkingl continue with low trunk rotation/SKTC/DKTC; hip strengthening (extension/abduction)  progress to more functional activity; Nustep for endurance; RUE strengthening  as able    PT Home Exercise Plan  low trunk rotation,  clams, walking (7 min)    Consulted and Agree with Plan of Care  Patient       Patient will benefit from skilled therapeutic intervention in order to improve the following deficits and impairments:  Abnormal gait, Decreased activity tolerance, Decreased balance, Difficulty walking, Decreased endurance, Decreased strength, Impaired UE functional use, Increased muscle spasms, Postural dysfunction, Pain, Decreased mobility  Visit Diagnosis: Pain in right hip  Muscle weakness (generalized)  Unsteadiness on feet  Acute pain of right shoulder     Problem List Patient Active Problem List   Diagnosis Date Noted  . Chest pain 03/10/2014  . Benign essential HTN 03/10/2014  . Abnormal EKG 03/10/2014  . GERD (gastroesophageal reflux disease) 07/07/2013  . Granulomatous lung disease ([place]) 07/07/2013  . Cough 06/13/2013    3:33 PM,04/11/17 Sherol Dade PT, DPT [place] [facility] [place]  [facility] [address], [place], [place], [address] Phone: [phone]   Fax:  [phone]  Name: Susan Marks MRN: [id number] Date of Birth: 1926-01-26

## 2017-04-16 ENCOUNTER — Encounter: Payer: Self-pay | Admitting: Physical Therapy

## 2017-04-16 ENCOUNTER — Ambulatory Visit: Payer: PPO | Admitting: Physical Therapy

## 2017-04-16 DIAGNOSIS — M25551 Pain in right hip: Secondary | ICD-10-CM

## 2017-04-16 DIAGNOSIS — R2681 Unsteadiness on feet: Secondary | ICD-10-CM

## 2017-04-16 DIAGNOSIS — M6281 Muscle weakness (generalized): Secondary | ICD-10-CM

## 2017-04-16 DIAGNOSIS — M810 Age-related osteoporosis without current pathological fracture: Secondary | ICD-10-CM | POA: Diagnosis not present

## 2017-04-16 DIAGNOSIS — M25511 Pain in right shoulder: Secondary | ICD-10-CM

## 2017-04-16 NOTE — Therapy (Signed)
[facility] [address] [place], [place], [address] Phone: [phone]   Fax:  [phone]  Physical Therapy Treatment  Patient Details  Name: Susan Marks MRN: [id number] Date of Birth: [birth year] Referring Provider: Levy Pupa, PA-C   Encounter Date: 04/16/2017  PT End of Session - 04/16/17 1504    Visit Number  15    Date for PT Re-Evaluation  05/16/17    Authorization Type  [employer]     Authorization Time Period  04/04/17 to 05/16/17    PT Start Time  1448    PT Stop Time  1528    PT Time Calculation (min)  40 min    Activity Tolerance  Patient tolerated treatment well;No increased pain    Behavior During Therapy  WFL for tasks assessed/performed       Past Medical History:  Diagnosis Date  . Chronic headaches    migraines  . High blood pressure   . High cholesterol     Past Surgical History:  Procedure Laterality Date  . VESICOVAGINAL FISTULA CLOSURE W/ TAH      There were no vitals filed for this visit.  Subjective Assessment - 04/16/17 1449    Subjective  Pt reports that she has been much better about completing her stretches over the past couple of days. She had some trouble with pressing on the accelerator on Friday. She is not having as much trouble with this now, but still is a little sore. She is having her injection this Friday.     Pertinent History  HTN, osteoporosis     Patient Stated Goals  improve pain     Currently in Pain?  Yes    Pain Score  6     Pain Location  -- Rt hip/thigh     Pain Orientation  Right;Lateral    Pain Descriptors / Indicators  Aching;Sore    Pain Type  Chronic pain    Pain Radiating Towards  none     Pain Onset  More than a month ago    Pain Frequency  Intermittent    Aggravating Factors   just hurts all over     Pain Relieving Factors  stretches help some          [facility] Adult PT Treatment/Exercise - 04/16/17 0001      Knee/Hip  Exercises: Aerobic   Nustep  L2 x6 min, PT present to discuss symptoms      Knee/Hip Exercises: Supine   Other Supine Knee/Hip Exercises  single leg clam with red TB x15 reps each     Other Supine Knee/Hip Exercises  oblique isometric with alt UE/LE press x10 reps each       Manual Therapy   Soft tissue mobilization  Rt lateral quad/glute    Myofascial Release  TPR Rt glute med     Passive ROM  Rt hip flexion/IR/ER x10 reps              PT Education - 04/16/17 1503    Education provided  Yes    Education Details  importance of allowing acute pain to resolve prior to finding cause for concern    Person(s) Educated  Patient    Methods  Explanation    Comprehension  Verbalized understanding       PT Short Term Goals - 04/11/17 1511      PT SHORT TERM GOAL #1   Title  Pt will demo consistency and independence  with her HEP to improve pain and strength of the Rt hip and shoulder.     Time  3    Period  Weeks    Status  Achieved      PT SHORT TERM GOAL #2   Title  Pt will report atleast 25% improvement in Rt shoulder and Rt hip pain from the start of therapy.     Baseline  Pt unclear whether pain is improved or worse overall.     Time  4    Period  Weeks    Status  Achieved        PT Long Term Goals - 04/11/17 1511      PT LONG TERM GOAL #1   Title  Pt will demo improved BLE strength to atleast 4/5MMT, and Rt shoulder strength to 4+/5 MMT which will increase her safety and efficiency with daily activity.     Baseline  hip strength 4/5 MMT atleast, except     Time  6    Period  Weeks    Status  On-going      PT LONG TERM GOAL #2   Title  Pt will report atleast 50% resolution in her Rt shoulder and hip discomfort with activity.     Baseline  back pain more frequent, but improved 50%; shoulder unable to say    Time  6    Period  Weeks    Status  On-going      PT LONG TERM GOAL #3   Title  Pt will demo improved functional strength evident by her ability to  complete 5x sit to stand in less tan 13 sec and without noted knee valgus deviation or use of UE for support.     Baseline  12 sec, minimal knee valgus with cuing from therapist    Time  6    Period  Weeks    Status  Partially Met      PT LONG TERM GOAL #4   Title  Pt will report improved stability evident by her report of no LOB from the start of therapy, to decrease her risk of falling and causing injury.    Baseline  Pt demonstrates no unsteadiness or LOB during sessions, however she does feel that she is overall unsteady    Time  6    Period  Weeks    Status  Partially Met      PT LONG TERM GOAL #5   Title  Pt will demo understanding and independence with her advanced HEP as well as verbalize understanding of the importance of continued adherence in order to prevent decline in her status.     Baseline  still learning, began walking program plan this session    Time  6    Period  Weeks    Status  On-going            Plan - 04/16/17 1504    Clinical Impression Statement  Pt arrived with increasing concern over Rt hip pain with pressing the gas pedal during driving. Noted tenderness along lateral thigh/hip region and completed soft tissue mobilization along the area to decrease tenderness and pain to the area. Pt was encouraged to notice any differences in her pain with driving home following today's session. No increase in pain was reported and pt was able to complete clamshell exercise without any difficulty/pain compared to prior sessions.     Rehab Potential  Good    PT Frequency  2x / week  PT Duration  8 weeks    PT Treatment/Interventions  ADLs/Self Care Home Management;Cryotherapy;Electrical Stimulation;Moist Heat;Iontophoresis 17m/ml Dexamethasone;Therapeutic exercise;Therapeutic activities;Functional mobility training;Stair training;Gait training;Balance training;Neuromuscular re-education;Patient/family education;Taping;Manual techniques;Passive range of motion;Dry  needling;Vestibular    PT Next Visit Plan  f/u on walking; f/u on pain with driving post manual; hip strengthening (extension/abduction) progress to more functional activity; Nustep for endurance; RUE strengthening as able    PT Home Exercise Plan  low trunk rotation,  clams, walking (7 min)    Consulted and Agree with Plan of Care  Patient       Patient will benefit from skilled therapeutic intervention in order to improve the following deficits and impairments:  Abnormal gait, Decreased activity tolerance, Decreased balance, Difficulty walking, Decreased endurance, Decreased strength, Impaired UE functional use, Increased muscle spasms, Postural dysfunction, Pain, Decreased mobility  Visit Diagnosis: Pain in right hip  Muscle weakness (generalized)  Unsteadiness on feet  Acute pain of right shoulder     Problem List Patient Active Problem List   Diagnosis Date Noted  . Chest pain 03/10/2014  . Benign essential HTN 03/10/2014  . Abnormal EKG 03/10/2014  . GERD (gastroesophageal reflux disease) 07/07/2013  . Granulomatous lung disease ([place] 07/07/2013  . Cough 06/13/2013   3:31 PM,04/16/17 SSherol DadePT, DPT [facility] [address] [place] [address]Phone: [phone]  Fax:  [phone] Name: SYVONNE STOPHERMRN: [id number] of Birth: [birth year]

## 2017-04-18 ENCOUNTER — Encounter: Payer: Self-pay | Admitting: Physical Therapy

## 2017-04-18 ENCOUNTER — Ambulatory Visit: Payer: PPO | Admitting: Physical Therapy

## 2017-04-18 DIAGNOSIS — M6281 Muscle weakness (generalized): Secondary | ICD-10-CM

## 2017-04-18 DIAGNOSIS — M25511 Pain in right shoulder: Secondary | ICD-10-CM

## 2017-04-18 DIAGNOSIS — R2681 Unsteadiness on feet: Secondary | ICD-10-CM

## 2017-04-18 DIAGNOSIS — M25551 Pain in right hip: Secondary | ICD-10-CM

## 2017-04-18 NOTE — Therapy (Signed)
[facility] [address], [place], [place], [address] Phone: [phone]   Fax:  [phone]  Physical Therapy Treatment  Patient Details  Name: Susan Marks MRN: [id number] Date of Birth: [birth year] Referring Provider: Levy Pupa, PA-C   Encounter Date: 04/18/2017  PT End of Session - 04/18/17 1539    Visit Number  16    Date for PT Re-Evaluation  05/16/17    Authorization Type  [employer]     Authorization Time Period  04/04/17 to 05/16/17    PT Start Time  3435    PT Stop Time  1730    PT Time Calculation (min)  40 min    Activity Tolerance  Patient tolerated treatment well;No increased pain    Behavior During Therapy  WFL for tasks assessed/performed       Past Medical History:  Diagnosis Date  . Chronic headaches    migraines  . High blood pressure   . High cholesterol     Past Surgical History:  Procedure Laterality Date  . VESICOVAGINAL FISTULA CLOSURE W/ TAH      There were no vitals filed for this visit.  Subjective Assessment - 04/18/17 1453    Subjective  Pt reports that she has felt better today than she has felt in a while. She was able to walk for atleast 5 minutes without any increase in back pain. She says that she is working on her exercises much more. Her hip is no longer bothering her when she drives.     Pertinent History  HTN, osteoporosis     Patient Stated Goals  improve pain     Currently in Pain?  No/denies    Pain Onset  More than a month ago              [facility] Adult PT Treatment/Exercise - 04/18/17 0001      Knee/Hip Exercises: Aerobic   Nustep  L2 x6 min, PT present to discuss       Knee/Hip Exercises: Standing   Heel Raises  Both;2 sets;10 reps    Hip Extension  2 sets;Both;10 reps;Knee straight    Extension Limitations  forearms resting on countertop       Knee/Hip Exercises: Seated   Sit to Sand  2 sets;10 reps;Other (comment) minimal UE  support and yellow TB around knees       Knee/Hip Exercises: Supine   Other Supine Knee/Hip Exercises  single leg clam with red TB x15 reps each              PT Education - 04/18/17 1539    Education provided  Yes    Education Details  technique with therex with HEP additions     Person(s) Educated  Patient    Methods  Explanation    Comprehension  Verbalized understanding;Returned demonstration       PT Short Term Goals - 04/11/17 1511      PT SHORT TERM GOAL #1   Title  Pt will demo consistency and independence with her HEP to improve pain and strength of the Rt hip and shoulder.     Time  3    Period  Weeks    Status  Achieved      PT SHORT TERM GOAL #2   Title  Pt will report atleast 25% improvement in Rt shoulder and Rt hip pain from the start of therapy.     Baseline  Pt unclear whether pain  is improved or worse overall.     Time  4    Period  Weeks    Status  Achieved        PT Long Term Goals - 04/11/17 1511      PT LONG TERM GOAL #1   Title  Pt will demo improved BLE strength to atleast 4/5MMT, and Rt shoulder strength to 4+/5 MMT which will increase her safety and efficiency with daily activity.     Baseline  hip strength 4/5 MMT atleast, except     Time  6    Period  Weeks    Status  On-going      PT LONG TERM GOAL #2   Title  Pt will report atleast 50% resolution in her Rt shoulder and hip discomfort with activity.     Baseline  back pain more frequent, but improved 50%; shoulder unable to say    Time  6    Period  Weeks    Status  On-going      PT LONG TERM GOAL #3   Title  Pt will demo improved functional strength evident by her ability to complete 5x sit to stand in less tan 13 sec and without noted knee valgus deviation or use of UE for support.     Baseline  12 sec, minimal knee valgus with cuing from therapist    Time  6    Period  Weeks    Status  Partially Met      PT LONG TERM GOAL #4   Title  Pt will report improved stability  evident by her report of no LOB from the start of therapy, to decrease her risk of falling and causing injury.    Baseline  Pt demonstrates no unsteadiness or LOB during sessions, however she does feel that she is overall unsteady    Time  6    Period  Weeks    Status  Partially Met      PT LONG TERM GOAL #5   Title  Pt will demo understanding and independence with her advanced HEP as well as verbalize understanding of the importance of continued adherence in order to prevent decline in her status.     Baseline  still learning, began walking program plan this session    Time  6    Period  Weeks    Status  On-going            Plan - 04/18/17 1540    Clinical Impression Statement  Pt noted resolved pain with pushing the gas pedal since her last session. Pt was able to walk up to 5 minutes without increase in low back pain. Today's session focused on gentle LE strengthening to improve pt's activity participation without fatigue. She did have difficulty initially with valgus control during sit to stand, however this was greatly improved after verbal/tactile cues were provided. This was added to pt's HEP and she verbalized/demonstrated understanding of this addition. Pt is scheduled to have an injection this Friday, so we will follow up with her on Monday.     Rehab Potential  Good    PT Frequency  2x / week    PT Duration  8 weeks    PT Treatment/Interventions  ADLs/Self Care Home Management;Cryotherapy;Electrical Stimulation;Moist Heat;Iontophoresis 71m/ml Dexamethasone;Therapeutic exercise;Therapeutic activities;Functional mobility training;Stair training;Gait training;Balance training;Neuromuscular re-education;Patient/family education;Taping;Manual techniques;Passive range of motion;Dry needling;Vestibular    PT Next Visit Plan  f/u on injection; f/u on walking; progress hip strengthening and begin  seated trunk strengthening/stretching; Nustep for endurance.     PT Home Exercise Plan  low  trunk rotation,  clams, walking (7 min), sit to stand     Consulted and Agree with Plan of Care  Patient       Patient will benefit from skilled therapeutic intervention in order to improve the following deficits and impairments:  Abnormal gait, Decreased activity tolerance, Decreased balance, Difficulty walking, Decreased endurance, Decreased strength, Impaired UE functional use, Increased muscle spasms, Postural dysfunction, Pain, Decreased mobility  Visit Diagnosis: Pain in right hip  Muscle weakness (generalized)  Unsteadiness on feet  Acute pain of right shoulder     Problem List Patient Active Problem List   Diagnosis Date Noted  . Chest pain 03/10/2014  . Benign essential HTN 03/10/2014  . Abnormal EKG 03/10/2014  . GERD (gastroesophageal reflux disease) 07/07/2013  . Granulomatous lung disease ([place]) 07/07/2013  . Cough 06/13/2013    3:46 PM,04/18/17 Sherol Dade PT, DPT [facility] [address] [place], [place], [address] Phone: [phone]   Fax:  [phone]  Name: ZAYDEE AINA MRN: [id number] Date of Birth: [birth year]

## 2017-04-20 DIAGNOSIS — M545 Low back pain: Secondary | ICD-10-CM | POA: Diagnosis not present

## 2017-04-20 DIAGNOSIS — M5137 Other intervertebral disc degeneration, lumbosacral region: Secondary | ICD-10-CM | POA: Diagnosis not present

## 2017-04-23 DIAGNOSIS — R05 Cough: Secondary | ICD-10-CM | POA: Diagnosis not present

## 2017-04-23 DIAGNOSIS — J069 Acute upper respiratory infection, unspecified: Secondary | ICD-10-CM | POA: Diagnosis not present

## 2017-04-24 ENCOUNTER — Encounter: Payer: PPO | Admitting: Physical Therapy

## 2017-04-26 ENCOUNTER — Encounter: Payer: PPO | Admitting: Physical Therapy

## 2017-04-30 ENCOUNTER — Encounter: Payer: Self-pay | Admitting: Physical Therapy

## 2017-04-30 ENCOUNTER — Ambulatory Visit: Payer: PPO | Attending: Chiropractic Medicine | Admitting: Physical Therapy

## 2017-04-30 DIAGNOSIS — M25551 Pain in right hip: Secondary | ICD-10-CM | POA: Insufficient documentation

## 2017-04-30 DIAGNOSIS — R262 Difficulty in walking, not elsewhere classified: Secondary | ICD-10-CM | POA: Insufficient documentation

## 2017-04-30 DIAGNOSIS — M6281 Muscle weakness (generalized): Secondary | ICD-10-CM | POA: Insufficient documentation

## 2017-04-30 DIAGNOSIS — R2681 Unsteadiness on feet: Secondary | ICD-10-CM | POA: Diagnosis not present

## 2017-04-30 DIAGNOSIS — M25511 Pain in right shoulder: Secondary | ICD-10-CM | POA: Insufficient documentation

## 2017-04-30 NOTE — Therapy (Signed)
[facility] [address], [place], [place], [address] Phone: [phone]   Fax:  [phone]  Physical Therapy Treatment  Patient Details  Name: Susan Marks MRN: [id number] Date of Birth: Jan 26, 1926 Referring Provider: Levy Pupa, PA-C   Encounter Date: 04/30/2017  PT End of Session - 04/30/17 1414    Visit Number  17    Date for PT Re-Evaluation  05/16/17    Authorization Type  [employer]     Authorization Time Period  04/04/17 to 05/16/17    PT Start Time  1602    PT Stop Time  1640    PT Time Calculation (min)  38 min    Activity Tolerance  Patient tolerated treatment well;No increased pain    Behavior During Therapy  WFL for tasks assessed/performed       Past Medical History:  Diagnosis Date  . Chronic headaches    migraines  . High blood pressure   . High cholesterol     Past Surgical History:  Procedure Laterality Date  . VESICOVAGINAL FISTULA CLOSURE W/ TAH      There were no vitals filed for this visit.  Subjective Assessment - 04/30/17 1403    Subjective  Pt reports that her injection went well. She says the pain was much improved following this, but about 6 days after, she noticed that her hip started bothering her a little more in general. She has not had much back pain however. She has not been feeling well over the past week or so, but she was able to do some of her exercises the past 2 or 3 days.    Pertinent History  HTN, osteoporosis     Patient Stated Goals  improve pain     Currently in Pain?  No/denies    Pain Onset  More than a month ago                      [facility] Adult PT Treatment/Exercise - 04/30/17 0001      Knee/Hip Exercises: Aerobic   Nustep  L2 x5 min, PT present to discuss session and f/u with pt regarding injection      Knee/Hip Exercises: Standing   Hip Extension  Both;10 reps;Knee straight;1 set    Extension Limitations  forearms  resting on countertop; tactile cues for spine alignment      Knee/Hip Exercises: Seated   Long Arc Quad  Both;1 set;15 reps;Weights    Long Arc Quad Weight  4 lbs.    Other Seated Knee/Hip Exercises  trunk rotation 5x10 sec each direction     Other Seated Knee/Hip Exercises  pelvic tilts anterior/posterior x10 reps heavy tactile/verbal cuing     Marching  Both;1 set;15 reps    Marching Limitations  abdominal bracing     Sit to Sand  2 sets;10 reps;without UE support             PT Education - 04/30/17 1413    Education provided  Yes    Education Details  encouraged pt to ease back into HEP to prevent decline in strength, flexibility, etc.     Person(s) Educated  Patient    Methods  Explanation    Comprehension  Verbalized understanding       PT Short Term Goals - 04/11/17 1511      PT SHORT TERM GOAL #1   Title  Pt will demo consistency and independence with her HEP to improve  pain and strength of the Rt hip and shoulder.     Time  3    Period  Weeks    Status  Achieved      PT SHORT TERM GOAL #2   Title  Pt will report atleast 25% improvement in Rt shoulder and Rt hip pain from the start of therapy.     Baseline  Pt unclear whether pain is improved or worse overall.     Time  4    Period  Weeks    Status  Achieved        PT Long Term Goals - 04/11/17 1511      PT LONG TERM GOAL #1   Title  Pt will demo improved BLE strength to atleast 4/5MMT, and Rt shoulder strength to 4+/5 MMT which will increase her safety and efficiency with daily activity.     Baseline  hip strength 4/5 MMT atleast, except     Time  6    Period  Weeks    Status  On-going      PT LONG TERM GOAL #2   Title  Pt will report atleast 50% resolution in her Rt shoulder and hip discomfort with activity.     Baseline  back pain more frequent, but improved 50%; shoulder unable to say    Time  6    Period  Weeks    Status  On-going      PT LONG TERM GOAL #3   Title  Pt will demo improved  functional strength evident by her ability to complete 5x sit to stand in less tan 13 sec and without noted knee valgus deviation or use of UE for support.     Baseline  12 sec, minimal knee valgus with cuing from therapist    Time  6    Period  Weeks    Status  Partially Met      PT LONG TERM GOAL #4   Title  Pt will report improved stability evident by her report of no LOB from the start of therapy, to decrease her risk of falling and causing injury.    Baseline  Pt demonstrates no unsteadiness or LOB during sessions, however she does feel that she is overall unsteady    Time  6    Period  Weeks    Status  Partially Met      PT LONG TERM GOAL #5   Title  Pt will demo understanding and independence with her advanced HEP as well as verbalize understanding of the importance of continued adherence in order to prevent decline in her status.     Baseline  still learning, began walking program plan this session    Time  6    Period  Weeks    Status  On-going            Plan - 04/30/17 1438    Clinical Impression Statement  Pt arrived reporting minimal back pain since receiving her injection. She noticed her hip has been bothering her more, however. Session focused on gentle lumbar and LE strengthening to ease pt back into exercises, following being out for over a week due to sickness. Pt was able to complete sit to stand with minimal knee valgus deviation as long as therapist provided initial cuing and reminders with this. Ended session without reports of increased pain in the low back or hip. Therapist encouraged pt to increase HEP adherence moving forward, and pt verbalized understanding of this.  Rehab Potential  Good    PT Frequency  2x / week    PT Duration  8 weeks    PT Treatment/Interventions  ADLs/Self Care Home Management;Cryotherapy;Electrical Stimulation;Moist Heat;Iontophoresis 33m/ml Dexamethasone;Therapeutic exercise;Therapeutic activities;Functional mobility  training;Stair training;Gait training;Balance training;Neuromuscular re-education;Patient/family education;Taping;Manual techniques;Passive range of motion;Dry needling;Vestibular    PT Next Visit Plan  f/u on HEP adherence; progress hip strengthening (standing hip ext resistance, side stepping) and begin seated trunk strengthening/stretching; Nustep for endurance.     PT Home Exercise Plan  low trunk rotation,  clams, walking (7 min), sit to stand     Consulted and Agree with Plan of Care  Patient       Patient will benefit from skilled therapeutic intervention in order to improve the following deficits and impairments:  Abnormal gait, Decreased activity tolerance, Decreased balance, Difficulty walking, Decreased endurance, Decreased strength, Impaired UE functional use, Increased muscle spasms, Postural dysfunction, Pain, Decreased mobility  Visit Diagnosis: Pain in right hip  Muscle weakness (generalized)  Unsteadiness on feet  Acute pain of right shoulder     Problem List Patient Active Problem List   Diagnosis Date Noted  . Chest pain 03/10/2014  . Benign essential HTN 03/10/2014  . Abnormal EKG 03/10/2014  . GERD (gastroesophageal reflux disease) 07/07/2013  . Gran[place]ous lung disease ([place] 07/07/2013  . Cough 06/13/2013   2:48 PM,04/30/17 SSherol DadePT, DPT [place] [facility] [address] [place] [address]Phone: [phone]  Fax:  [phone] Name: Susan AHOMRN: [id number] of Birth: 5May 15, 1927

## 2017-05-03 ENCOUNTER — Ambulatory Visit: Payer: PPO | Admitting: Physical Therapy

## 2017-05-03 DIAGNOSIS — M25511 Pain in right shoulder: Secondary | ICD-10-CM

## 2017-05-03 DIAGNOSIS — M6281 Muscle weakness (generalized): Secondary | ICD-10-CM

## 2017-05-03 DIAGNOSIS — R2681 Unsteadiness on feet: Secondary | ICD-10-CM

## 2017-05-03 DIAGNOSIS — R262 Difficulty in walking, not elsewhere classified: Secondary | ICD-10-CM

## 2017-05-03 DIAGNOSIS — M25551 Pain in right hip: Secondary | ICD-10-CM | POA: Diagnosis not present

## 2017-05-03 NOTE — Patient Instructions (Signed)
   DOUBLE KNEE TO CHEST STRETCH - DKTC  While Lying on your back,  hold your knees and gently pull them up towards your chest.  Hold 10 sec, repeat 5 times.      SINGLE KNEE TO CHEST STRETCH - SKTC  While lying on your back, use your hands and gently draw up a knee towards your chest.   Keep your other knee straight and lying on the ground.  Hold 10 sec, repeat 5-10 times.      STRETCH - PIRIFORMIS  Start in a supine position, one leg bent with foot flat on the bed/floor, and the other leg crossed over the knee.  Gently push the knee of the crossed leg forward until a strong yet pain-free stretch is felt.  Hold for 30 seconds then release.  Repeat as many sets as instructed, then switch sides and repeat.         HIP EXTENSION - STANDING  While standing, balance on one leg and move your other leg in a backward direction. Do not swing the leg. Perform smooth and controlled movements.   Keep your trunk stable and without arching during the movement.   Use your arms for support if needed for balance and safety.  x10 reps, 2 sets on each leg.       Side stepping with theraband  Place hands on kitchen counter for balance. Side step to the right 2 to 3 steps. Side step to the left 2 to 3 steps. Repeat 2 sets until fatigued.  Sit down when taking yellow band on/off         SIT TO STAND - NO SUPPORT  Start by scooting close to the front of the chair.  Next, lean forward at your trunk and reach forward with your arms and rise to standing without using your hands to push off from the chair or other object.   Use your arms as a counter-balance by reaching forward when in sitting and lower them as you approach standing.     Make sure knees do not touch  Complete 10 reps every day.    [place] [address] [place], [place] [address] Phone # [phone] Fax [phone]

## 2017-05-03 NOTE — Therapy (Signed)
[facility] [address] [place], [place], [address] Phone: [phone]   Fax:  [phone]  Physical Therapy Treatment/Discharge  Patient Details  Name: Susan Marks MRN: [id number] Date of Birth: 11/18/1925 Referring Provider: Levy Pupa, PA-C   Encounter Date: 05/03/2017  PT End of Session - 05/03/17 1045    Visit Number  18    Date for PT Re-Evaluation  05/16/17    Authorization Type  [employer]     Authorization Time Period  04/04/17 to 05/16/17    PT Start Time  1018    PT Stop Time  1054    PT Time Calculation (min)  36 min    Activity Tolerance  Patient tolerated treatment well;No increased pain    Behavior During Therapy  WFL for tasks assessed/performed       Past Medical History:  Diagnosis Date  . Chronic headaches    migraines  . High blood pressure   . High cholesterol     Past Surgical History:  Procedure Laterality Date  . VESICOVAGINAL FISTULA CLOSURE W/ TAH      There were no vitals filed for this visit.  Subjective Assessment - 05/03/17 1218    Subjective  Pt reports things are going well. She is ready for discharge today and is ok with canceling her remaining appointments. She feels that she is doing well now that she has been more active and consistent with her HEP.     Pertinent History  HTN, osteoporosis     Patient Stated Goals  improve pain     Currently in Pain?  No/denies    Pain Onset  More than a month ago         [facility] PT Assessment - 05/03/17 0001      Assessment   Medical Diagnosis  Rt shoulder/Rt hip pain     Referring Provider  Levy Pupa, PA-C    Hand Dominance  Right      Prior Function   Level of Independence  Requires assistive device for independence      Sensation   Light Touch  Appears Intact      Strength   Right Hip Flexion  5/5    Right Hip Extension  4/5    Right Hip ABduction  3+/5    Left Hip Flexion  5/5    Left Hip  Extension  4/5    Left Hip ABduction  4/5    Right Knee Flexion  5/5    Right Knee Extension  5/5    Left Knee Flexion  5/5    Left Knee Extension  5/5      Transfers   Five time sit to stand comments   9 sec, no UE, no knee valgus noted             [facility] Adult PT Treatment/Exercise - 05/03/17 0001      Knee/Hip Exercises: Stretches   Piriformis Stretch  Both;1 rep;30 seconds;Other (comment) supine     Other Knee/Hip Stretches  SKTC 2x10 sec each       Knee/Hip Exercises: Standing   Hip Extension  Both;1 set;10 reps;Knee straight    Extension Limitations  standing straight with UE support on counter    Other Standing Knee Exercises  sidestepping with yellow TB around ankles 3 steps Lt/Rt x2 sets              PT Education - 05/03/17 1044    Education  provided  Yes    Education Details  ways to progress exercises at home; importance of continuing with HEP regularly moving forward to maintain progress made in therapy and improve overall wellness    Person(s) Educated  Patient    Methods  Explanation;Handout    Comprehension  Verbalized understanding;Returned demonstration       PT Short Term Goals - 05/03/17 1025      PT SHORT TERM GOAL #1   Title  Pt will demo consistency and independence with her HEP to improve pain and strength of the Rt hip and shoulder.     Time  3    Period  Weeks    Status  Achieved      PT SHORT TERM GOAL #2   Title  Pt will report atleast 25% improvement in Rt shoulder and Rt hip pain from the start of therapy.     Baseline  Pt unclear whether pain is improved or worse overall.     Time  4    Period  Weeks    Status  Achieved        PT Long Term Goals - 05/03/17 1026      PT LONG TERM GOAL #1   Title  Pt will demo improved BLE strength to atleast 4/5MMT, and Rt shoulder strength to 4+/5 MMT which will increase her safety and efficiency with daily activity.     Baseline  hip strength 4/5 MMT atleast, except hip abduction on the  Rt    Time  6    Period  Weeks    Status  Partially Met      PT LONG TERM GOAL #2   Title  Pt will report atleast 50% resolution in her Rt shoulder and hip discomfort with activity.     Baseline  Pt feels it is atleast 50% improved     Time  6    Period  Weeks    Status  Achieved      PT LONG TERM GOAL #3   Title  Pt will demo improved functional strength evident by her ability to complete 5x sit to stand in less tan 13 sec and without noted knee valgus deviation or use of UE for support.     Baseline  9 sec    Time  6    Period  Weeks    Status  Achieved      PT LONG TERM GOAL #4   Title  Pt will report improved stability evident by her report of no LOB from the start of therapy, to decrease her risk of falling and causing injury.    Baseline  pt feels that she is much more steady throughout the day.     Time  6    Period  Weeks    Status  Achieved      PT LONG TERM GOAL #5   Title  Pt will demo understanding and independence with her advanced HEP as well as verbalize understanding of the importance of continued adherence in order to prevent decline in her status.     Time  6    Period  Weeks    Status  Achieved            Plan - 05/03/17 1100    Clinical Impression Statement  Pt was discharged this visit feeling pleased with her progress since beginning PT. She demonstrates near 5/5 MMT in the LEs except for Rt hip abduction which is improved but  remains limited. She was able to complete 5x sit to stand in less than 10 sec without UE support and overall feels more steady with activity throughout the day. Her Rt shoulder rarely bothers her and her hip is greater than 50% improved. She recently had a low back injection which nearly resolved all of her low back discomfort as well. Pt has become increasingly independent with her HEP and has good understanding of the importance of continuing this moving forward in order to decrease the risk of return of her pain/stiffness. She is  agreeable with d/c at his time and therapist made sure pt had good understanding of her final HEP.      Rehab Potential  Good    PT Frequency  2x / week    PT Duration  8 weeks    PT Treatment/Interventions  ADLs/Self Care Home Management;Cryotherapy;Electrical Stimulation;Moist Heat;Iontophoresis 68m/ml Dexamethasone;Therapeutic exercise;Therapeutic activities;Functional mobility training;Stair training;Gait training;Balance training;Neuromuscular re-education;Patient/family education;Taping;Manual techniques;Passive range of motion;Dry needling;Vestibular    PT Next Visit Plan  d/c home     PT Home Exercise Plan  low trunk rotation,  clams, walking (7 min), sit to stand     Consulted and Agree with Plan of Care  Patient       Patient will benefit from skilled therapeutic intervention in order to improve the following deficits and impairments:  Abnormal gait, Decreased activity tolerance, Decreased balance, Difficulty walking, Decreased endurance, Decreased strength, Impaired UE functional use, Increased muscle spasms, Postural dysfunction, Pain, Decreased mobility  Visit Diagnosis: Pain in right hip  Muscle weakness (generalized)  Unsteadiness on feet  Acute pain of right shoulder  Difficulty walking     Problem List Patient Active Problem List   Diagnosis Date Noted  . Chest pain 03/10/2014  . Benign essential HTN 03/10/2014  . Abnormal EKG 03/10/2014  . GERD (gastroesophageal reflux disease) 07/07/2013  . Gran[place]omatous lung disease [place] 07/07/2013  . Cough 06/13/2013    PHYSICAL THERAPY DISCHARGE SUMMARY  Visits from Start of Care: 18  Current functional level related to goals / functional outcomes: See above for more details    Remaining deficits: See above for more details    Education / Equipment: See above for more details  Plan: Patient agrees to discharge.  Patient goals were not met. Patient is being discharged due to meeting the stated rehab goals.   ?????     12:20 PM,05/03/17 SSherol DadePT, DEastonat [facility] [address] [place] [address]Phone: [phone]  Fax:  [phone] Name: Susan AHNMRN: [id number] of Birth: 5October 14, 1927

## 2017-05-07 ENCOUNTER — Encounter: Payer: PPO | Admitting: Physical Therapy

## 2017-05-09 ENCOUNTER — Encounter: Payer: PPO | Admitting: Physical Therapy

## 2017-05-14 ENCOUNTER — Encounter: Payer: PPO | Admitting: Physical Therapy

## 2017-05-16 ENCOUNTER — Encounter: Payer: PPO | Admitting: Physical Therapy

## 2017-05-16 DIAGNOSIS — M545 Low back pain: Secondary | ICD-10-CM | POA: Diagnosis not present

## 2017-06-12 DIAGNOSIS — M4316 Spondylolisthesis, lumbar region: Secondary | ICD-10-CM | POA: Diagnosis not present

## 2017-06-12 DIAGNOSIS — M5136 Other intervertebral disc degeneration, lumbar region: Secondary | ICD-10-CM | POA: Diagnosis not present

## 2017-09-05 DIAGNOSIS — M7061 Trochanteric bursitis, right hip: Secondary | ICD-10-CM | POA: Diagnosis not present

## 2017-09-11 DIAGNOSIS — Z1231 Encounter for screening mammogram for malignant neoplasm of breast: Secondary | ICD-10-CM | POA: Diagnosis not present

## 2017-10-15 DIAGNOSIS — M810 Age-related osteoporosis without current pathological fracture: Secondary | ICD-10-CM | POA: Diagnosis not present

## 2017-10-17 DIAGNOSIS — M810 Age-related osteoporosis without current pathological fracture: Secondary | ICD-10-CM | POA: Diagnosis not present

## 2017-11-12 DIAGNOSIS — R7301 Impaired fasting glucose: Secondary | ICD-10-CM | POA: Diagnosis not present

## 2017-11-12 DIAGNOSIS — N183 Chronic kidney disease, stage 3 (moderate): Secondary | ICD-10-CM | POA: Diagnosis not present

## 2017-11-12 DIAGNOSIS — I129 Hypertensive chronic kidney disease with stage 1 through stage 4 chronic kidney disease, or unspecified chronic kidney disease: Secondary | ICD-10-CM | POA: Diagnosis not present

## 2017-11-13 DIAGNOSIS — R7301 Impaired fasting glucose: Secondary | ICD-10-CM | POA: Diagnosis not present

## 2017-12-06 ENCOUNTER — Encounter: Payer: Self-pay | Admitting: Skilled Nursing Facility1

## 2017-12-06 ENCOUNTER — Encounter: Payer: PPO | Attending: Family Medicine | Admitting: Skilled Nursing Facility1

## 2017-12-06 DIAGNOSIS — N183 Chronic kidney disease, stage 3 unspecified: Secondary | ICD-10-CM

## 2017-12-06 NOTE — Progress Notes (Signed)
  Assessment:  Primary concerns today: CKD.   Pt states she gets injections for bone density. Pt states she has lost about 5 pounds in the last 2 weeks. Pt states she has trouble remembering words sometimes which worries her. Pts husband is alive and they have been together for over 45 years.  Pt is sharp and can hear very well. Pt states she does not want to lose anymore weight. Pt states she has acid reflux.   MEDICATIONS: See List   DIETARY INTAKE:  Usual eating pattern includes 3 meals and 1-2 snacks per day.  Everyday foods include none stated.  Avoided foods include none stated .    24-hr recall: eating out a couple times a week: always ordering fish B ( AM): 1 T of peanut butter of 1 piece of toast or oatmeal  Snk ( AM):  L ( PM): egg and salad Snk ( PM):  D ( PM): egg and ham or chicken or beef  Snk ( PM): popcorn  Beverages: almond milk, hot tea, water  Usual physical activity: ADL's  Estimated energy needs: 1500 calories 170 g carbohydrates 112 g protein 42 g fat  Progress Towards Goal(s):  In progress.    Intervention:  Nutrition counseling for CKD. Goals: -Start silver sneakers -star arm chair exercises   Teaching Method Utilized:  Visual Auditory Hands on  Handouts given during visit include:  Yellow card  Barriers to learning/adherence to lifestyle change: none identified   Demonstrated degree of understanding via:  Teach Back   Monitoring/Evaluation:  Dietary intake, exercise, and body weight prn.

## 2018-01-24 DIAGNOSIS — Z90710 Acquired absence of both cervix and uterus: Secondary | ICD-10-CM | POA: Diagnosis not present

## 2018-01-24 DIAGNOSIS — Z8262 Family history of osteoporosis: Secondary | ICD-10-CM | POA: Diagnosis not present

## 2018-01-24 DIAGNOSIS — M810 Age-related osteoporosis without current pathological fracture: Secondary | ICD-10-CM | POA: Diagnosis not present

## 2018-01-29 ENCOUNTER — Ambulatory Visit: Payer: PPO | Admitting: Skilled Nursing Facility1

## 2018-02-04 ENCOUNTER — Telehealth: Payer: Self-pay | Admitting: Cardiology

## 2018-02-04 ENCOUNTER — Ambulatory Visit: Payer: PPO | Admitting: Cardiology

## 2018-02-04 NOTE — Telephone Encounter (Signed)
Called and left message for pt that Dr Marlou Porch will not be able to refill medications for her if he doesn't see her once a year.  Advised if PCP wants to refill medication and she is not having problems she does not have to schedule a f/u at this time.  Advised to call back if she would like to schedule an appt.

## 2018-02-04 NOTE — Telephone Encounter (Signed)
  Patient is due for one year f/u but called because she hasn't had any heart issues and wants to know if she really needs to make an appt or if it can wait

## 2018-02-19 DIAGNOSIS — E559 Vitamin D deficiency, unspecified: Secondary | ICD-10-CM | POA: Diagnosis not present

## 2018-02-19 DIAGNOSIS — I447 Left bundle-branch block, unspecified: Secondary | ICD-10-CM | POA: Diagnosis not present

## 2018-02-19 DIAGNOSIS — I10 Essential (primary) hypertension: Secondary | ICD-10-CM | POA: Diagnosis not present

## 2018-02-19 DIAGNOSIS — E538 Deficiency of other specified B group vitamins: Secondary | ICD-10-CM | POA: Diagnosis not present

## 2018-02-19 DIAGNOSIS — Z79899 Other long term (current) drug therapy: Secondary | ICD-10-CM | POA: Diagnosis not present

## 2018-02-19 DIAGNOSIS — F325 Major depressive disorder, single episode, in full remission: Secondary | ICD-10-CM | POA: Diagnosis not present

## 2018-02-19 DIAGNOSIS — R7301 Impaired fasting glucose: Secondary | ICD-10-CM | POA: Diagnosis not present

## 2018-02-19 DIAGNOSIS — N183 Chronic kidney disease, stage 3 (moderate): Secondary | ICD-10-CM | POA: Diagnosis not present

## 2018-02-19 DIAGNOSIS — Z0000 Encounter for general adult medical examination without abnormal findings: Secondary | ICD-10-CM | POA: Diagnosis not present

## 2018-02-19 DIAGNOSIS — E02 Subclinical iodine-deficiency hypothyroidism: Secondary | ICD-10-CM | POA: Diagnosis not present

## 2018-02-19 DIAGNOSIS — E785 Hyperlipidemia, unspecified: Secondary | ICD-10-CM | POA: Diagnosis not present

## 2018-02-19 DIAGNOSIS — R413 Other amnesia: Secondary | ICD-10-CM | POA: Diagnosis not present

## 2018-03-04 ENCOUNTER — Other Ambulatory Visit: Payer: Self-pay | Admitting: Family Medicine

## 2018-03-04 DIAGNOSIS — N183 Chronic kidney disease, stage 3 unspecified: Secondary | ICD-10-CM

## 2018-03-04 DIAGNOSIS — R946 Abnormal results of thyroid function studies: Secondary | ICD-10-CM

## 2018-03-18 ENCOUNTER — Other Ambulatory Visit: Payer: PPO

## 2018-03-27 ENCOUNTER — Ambulatory Visit
Admission: RE | Admit: 2018-03-27 | Discharge: 2018-03-27 | Disposition: A | Discharge status: Home / Self Care | Payer: PPO | Source: Ambulatory Visit | Attending: Family Medicine | Admitting: Family Medicine

## 2018-03-27 DIAGNOSIS — N183 Chronic kidney disease, stage 3 unspecified: Secondary | ICD-10-CM

## 2018-03-27 DIAGNOSIS — R946 Abnormal results of thyroid function studies: Secondary | ICD-10-CM

## 2018-03-29 DIAGNOSIS — E039 Hypothyroidism, unspecified: Secondary | ICD-10-CM | POA: Diagnosis not present

## 2018-03-30 ENCOUNTER — Encounter (HOSPITAL_COMMUNITY): Payer: Self-pay | Admitting: Emergency Medicine

## 2018-03-30 ENCOUNTER — Observation Stay (HOSPITAL_COMMUNITY): Payer: PPO

## 2018-03-30 ENCOUNTER — Other Ambulatory Visit: Payer: Self-pay

## 2018-03-30 ENCOUNTER — Emergency Department (HOSPITAL_COMMUNITY): Payer: PPO

## 2018-03-30 ENCOUNTER — Inpatient Hospital Stay (HOSPITAL_COMMUNITY)
Admission: EM | Admit: 2018-03-30 | Discharge: 2018-04-03 | DRG: 392 | Disposition: A | Discharge status: Home / Self Care | Payer: PPO | Attending: Nephrology | Admitting: Nephrology

## 2018-03-30 DIAGNOSIS — R109 Unspecified abdominal pain: Secondary | ICD-10-CM | POA: Diagnosis not present

## 2018-03-30 DIAGNOSIS — K5780 Diverticulitis of intestine, part unspecified, with perforation and abscess without bleeding: Secondary | ICD-10-CM | POA: Diagnosis present

## 2018-03-30 DIAGNOSIS — J9819 Other pulmonary collapse: Secondary | ICD-10-CM | POA: Diagnosis not present

## 2018-03-30 DIAGNOSIS — N189 Chronic kidney disease, unspecified: Secondary | ICD-10-CM | POA: Diagnosis not present

## 2018-03-30 DIAGNOSIS — Z79899 Other long term (current) drug therapy: Secondary | ICD-10-CM | POA: Diagnosis not present

## 2018-03-30 DIAGNOSIS — E785 Hyperlipidemia, unspecified: Secondary | ICD-10-CM | POA: Diagnosis present

## 2018-03-30 DIAGNOSIS — K5700 Diverticulitis of small intestine with perforation and abscess without bleeding: Secondary | ICD-10-CM | POA: Diagnosis not present

## 2018-03-30 DIAGNOSIS — R918 Other nonspecific abnormal finding of lung field: Secondary | ICD-10-CM | POA: Diagnosis not present

## 2018-03-30 DIAGNOSIS — Z83511 Family history of glaucoma: Secondary | ICD-10-CM

## 2018-03-30 DIAGNOSIS — Z7982 Long term (current) use of aspirin: Secondary | ICD-10-CM | POA: Diagnosis not present

## 2018-03-30 DIAGNOSIS — R1314 Dysphagia, pharyngoesophageal phase: Secondary | ICD-10-CM | POA: Diagnosis not present

## 2018-03-30 DIAGNOSIS — N183 Chronic kidney disease, stage 3 (moderate): Secondary | ICD-10-CM | POA: Diagnosis not present

## 2018-03-30 DIAGNOSIS — I447 Left bundle-branch block, unspecified: Secondary | ICD-10-CM | POA: Diagnosis present

## 2018-03-30 DIAGNOSIS — Z888 Allergy status to other drugs, medicaments and biological substances status: Secondary | ICD-10-CM

## 2018-03-30 DIAGNOSIS — I129 Hypertensive chronic kidney disease with stage 1 through stage 4 chronic kidney disease, or unspecified chronic kidney disease: Secondary | ICD-10-CM | POA: Diagnosis not present

## 2018-03-30 DIAGNOSIS — D71 Functional disorders of polymorphonuclear neutrophils: Secondary | ICD-10-CM | POA: Diagnosis present

## 2018-03-30 DIAGNOSIS — Z882 Allergy status to sulfonamides status: Secondary | ICD-10-CM | POA: Diagnosis not present

## 2018-03-30 DIAGNOSIS — I10 Essential (primary) hypertension: Secondary | ICD-10-CM | POA: Diagnosis present

## 2018-03-30 DIAGNOSIS — K219 Gastro-esophageal reflux disease without esophagitis: Secondary | ICD-10-CM | POA: Diagnosis present

## 2018-03-30 DIAGNOSIS — K5720 Diverticulitis of large intestine with perforation and abscess without bleeding: Secondary | ICD-10-CM | POA: Diagnosis not present

## 2018-03-30 DIAGNOSIS — E7800 Pure hypercholesterolemia, unspecified: Secondary | ICD-10-CM | POA: Diagnosis present

## 2018-03-30 DIAGNOSIS — K5792 Diverticulitis of intestine, part unspecified, without perforation or abscess without bleeding: Secondary | ICD-10-CM | POA: Diagnosis not present

## 2018-03-30 DIAGNOSIS — Z8249 Family history of ischemic heart disease and other diseases of the circulatory system: Secondary | ICD-10-CM | POA: Diagnosis not present

## 2018-03-30 DIAGNOSIS — Z791 Long term (current) use of non-steroidal anti-inflammatories (NSAID): Secondary | ICD-10-CM | POA: Diagnosis not present

## 2018-03-30 DIAGNOSIS — R1032 Left lower quadrant pain: Secondary | ICD-10-CM | POA: Diagnosis not present

## 2018-03-30 DIAGNOSIS — K5900 Constipation, unspecified: Secondary | ICD-10-CM | POA: Diagnosis present

## 2018-03-30 DIAGNOSIS — J189 Pneumonia, unspecified organism: Secondary | ICD-10-CM

## 2018-03-30 DIAGNOSIS — R1031 Right lower quadrant pain: Secondary | ICD-10-CM | POA: Diagnosis not present

## 2018-03-30 DIAGNOSIS — Z8719 Personal history of other diseases of the digestive system: Secondary | ICD-10-CM | POA: Diagnosis not present

## 2018-03-30 DIAGNOSIS — Z881 Allergy status to other antibiotic agents status: Secondary | ICD-10-CM | POA: Diagnosis not present

## 2018-03-30 LAB — COMPREHENSIVE METABOLIC PANEL
ALT: 24 U/L (ref 0–44)
AST: 26 U/L (ref 15–41)
Albumin: 4.3 g/dL (ref 3.5–5.0)
Alkaline Phosphatase: 16 U/L — ABNORMAL LOW (ref 38–126)
Anion gap: 10 (ref 5–15)
BUN: 35 mg/dL — ABNORMAL HIGH (ref 8–23)
CO2: 22 mmol/L (ref 22–32)
Calcium: 9.3 mg/dL (ref 8.9–10.3)
Chloride: 103 mmol/L (ref 98–111)
Creatinine, Ser: 1.19 mg/dL — ABNORMAL HIGH (ref 0.44–1.00)
GFR, EST AFRICAN AMERICAN: 47 mL/min — AB (ref >60)
GFR, EST NON AFRICAN AMERICAN: 40 mL/min — AB (ref >60)
Glucose, Bld: 114 mg/dL — ABNORMAL HIGH (ref 70–99)
Potassium: 4.6 mmol/L (ref 3.5–5.1)
Sodium: 135 mmol/L (ref 135–145)
Total Bilirubin: 1.1 mg/dL (ref 0.3–1.2)
Total Protein: 7.1 g/dL (ref 6.5–8.1)

## 2018-03-30 LAB — LIPASE, BLOOD: Lipase: 35 U/L (ref 11–51)

## 2018-03-30 MED ORDER — FENTANYL CITRATE (PF) 100 MCG/2ML IJ SOLN
25.0000 ug | Freq: Once | INTRAMUSCULAR | Status: AC
  Administered 2018-03-30: 25 ug via INTRAVENOUS
  Filled 2018-03-30: qty 2

## 2018-03-30 MED ORDER — ACETAMINOPHEN 325 MG PO TABS: 650.0000 mg | ORAL_TABLET | Freq: Four times a day (QID) | ORAL | Status: DC | PRN

## 2018-03-30 MED ORDER — SODIUM CHLORIDE 0.9 % IV SOLN
Freq: Once | INTRAVENOUS | Status: AC
  Administered 2018-03-30: 19:00:00 via INTRAVENOUS

## 2018-03-30 MED ORDER — ONDANSETRON HCL 4 MG/2ML IJ SOLN
4.0000 mg | Freq: Once | INTRAMUSCULAR | Status: DC
  Filled 2018-03-30: qty 2

## 2018-03-30 MED ORDER — LOSARTAN POTASSIUM 50 MG PO TABS: 100.0000 mg | ORAL_TABLET | Freq: Every day | ORAL | Status: DC

## 2018-03-30 MED ORDER — LOSARTAN POTASSIUM 50 MG PO TABS
100.0000 mg | ORAL_TABLET | Freq: Every day | ORAL | Status: DC
  Administered 2018-03-31 – 2018-04-03 (×4): 100 mg via ORAL
  Filled 2018-03-30 (×4): qty 2

## 2018-03-30 MED ORDER — SODIUM CHLORIDE 0.9 % IV BOLUS
1000.0000 mL | Freq: Once | INTRAVENOUS | Status: AC
  Administered 2018-03-30: 1000 mL via INTRAVENOUS

## 2018-03-30 MED ORDER — SODIUM CHLORIDE (PF) 0.9 % IJ SOLN
INTRAMUSCULAR | Status: AC
  Filled 2018-03-30: qty 50

## 2018-03-30 MED ORDER — MORPHINE SULFATE (PF) 4 MG/ML IV SOLN
4.0000 mg | Freq: Once | INTRAVENOUS | Status: DC
  Filled 2018-03-30: qty 1

## 2018-03-30 MED ORDER — HYDROCODONE-ACETAMINOPHEN 5-325 MG PO TABS
1.0000 | ORAL_TABLET | ORAL | Status: DC | PRN
  Administered 2018-03-31 – 2018-04-02 (×4): 1 via ORAL
  Filled 2018-03-30 (×4): qty 1

## 2018-03-30 MED ORDER — MORPHINE SULFATE (PF) 2 MG/ML IV SOLN: 1.0000 mg | INTRAVENOUS | Status: DC | PRN

## 2018-03-30 MED ORDER — IOHEXOL 300 MG/ML  SOLN
60.0000 mL | Freq: Once | INTRAMUSCULAR | Status: AC | PRN
  Administered 2018-03-30: 60 mL via INTRAVENOUS

## 2018-03-30 MED ORDER — ACETAMINOPHEN 650 MG RE SUPP: 650.0000 mg | Freq: Four times a day (QID) | RECTAL | Status: DC | PRN

## 2018-03-30 MED ORDER — PIPERACILLIN-TAZOBACTAM 3.375 G IVPB
3.3750 g | Freq: Three times a day (TID) | INTRAVENOUS | Status: DC
  Administered 2018-03-31 – 2018-04-03 (×11): 3.375 g via INTRAVENOUS
  Filled 2018-03-30 (×11): qty 50

## 2018-03-30 MED ORDER — SODIUM CHLORIDE 0.9 % IV SOLN
INTRAVENOUS | Status: AC
  Administered 2018-03-31: via INTRAVENOUS

## 2018-03-30 MED ORDER — ONDANSETRON HCL 4 MG/2ML IJ SOLN
4.0000 mg | Freq: Once | INTRAMUSCULAR | Status: AC
  Administered 2018-03-30: 4 mg via INTRAVENOUS
  Filled 2018-03-30: qty 2

## 2018-03-30 MED ORDER — ONDANSETRON HCL 4 MG PO TABS
4.0000 mg | ORAL_TABLET | Freq: Four times a day (QID) | ORAL | Status: DC | PRN
  Administered 2018-04-01 – 2018-04-02 (×2): 4 mg via ORAL
  Filled 2018-03-30 (×2): qty 1

## 2018-03-30 MED ORDER — PIPERACILLIN-TAZOBACTAM 3.375 G IVPB 30 MIN
3.3750 g | Freq: Once | INTRAVENOUS | Status: AC
  Administered 2018-03-30: 3.375 g via INTRAVENOUS
  Filled 2018-03-30: qty 50

## 2018-03-30 MED ORDER — ONDANSETRON HCL 4 MG/2ML IJ SOLN: 4.0000 mg | Freq: Four times a day (QID) | INTRAMUSCULAR | Status: DC | PRN

## 2018-03-30 NOTE — ED Notes (Signed)
Hospitalist at bedside 

## 2018-03-30 NOTE — ED Notes (Signed)
Attempted to call for report , receiving Nurse unavailable. Will call back in a few.

## 2018-03-30 NOTE — ED Provider Notes (Signed)
[facility] DEPT Provider Note   CSN: [id number] Arrival date & time: 03/30/18  1509     History   Chief Complaint Chief Complaint  Patient presents with  . elevated WBC  . Abdominal Pain    HPI Susan Marks is a 85 y.o. female.  Pt presents to the ED today with n/v and LLQ abd pain.  The pt said she woke up around 0200 with nausea and LLQ abd pain.  She has had diverticulitis and thinks that it may be this again.  She went to urgent care who sent her here.  She had a UA there which was negative and CBC which showed a WBC of 15.8.  The pt has not had a fever.  She does not want anything for pain or nausea now.     Past Medical History:  Diagnosis Date  . Chronic headaches    migraines  . Chronic kidney disease   . High blood pressure   . High cholesterol     Patient Active Problem List   Diagnosis Date Noted  . LBBB (left bundle branch block) 03/30/2018  . Diverticulitis 03/30/2018  . Chest pain 03/10/2014  . Benign essential HTN 03/10/2014  . Abnormal EKG 03/10/2014  . GERD (gastroesophageal reflux disease) 07/07/2013  . Granulomatous lung disease ([place]) 07/07/2013  . Cough 06/13/2013    Past Surgical History:  Procedure Laterality Date  . VESICOVAGINAL FISTULA CLOSURE W/ TAH       OB History   No obstetric history on file.      Home Medications    Prior to Admission medications   Medication Sig Start Date End Date Taking? Authorizing Provider  acetaminophen (TYLENOL) 500 MG tablet Take 1,000 mg by mouth every 6 (six) hours as needed for mild pain.   Yes [provider]  aspirin 81 MG tablet Take 81 mg by mouth daily.   Yes [provider]  Calcium-Vitamin D-Vitamin K (VIACTIV PO) Take 2 capsules by mouth daily.   Yes [provider]  cholecalciferol (VITAMIN D) 400 units TABS tablet Take 400 Units by mouth.   Yes [provider]  cyanocobalamin 2000 MCG tablet Take 2,000 mcg by  mouth daily.   Yes [provider]  denosumab (PROLIA) 60 MG/ML SOLN injection Inject 60 mg into the skin every 6 (six) months. Administer in upper arm, thigh, or abdomen   Yes [provider]  famotidine (PEPCID) 20 MG tablet Take 20 mg by mouth at bedtime.   Yes [provider]  fenofibrate micronized (LOFIBRA) 200 MG capsule Take 200 mg by mouth at bedtime.   Yes [provider]  ibuprofen (ADVIL,MOTRIN) 200 MG tablet Take 400 mg by mouth as needed for moderate pain.    Yes [provider]  losartan (COZAAR) 100 MG tablet TAKE 1 TABLET BY MOUTH EVERY DAY 09/16/14  Yes Turner, Eber Hong, MD  polyethylene glycol (MIRALAX / GLYCOLAX) packet Take 17 g by mouth daily as needed for mild constipation.    Yes [provider]  Azelastine-Fluticasone (DYMISTA) 137-50 MCG/ACT SUSP Place 1-2 sprays into both nostrils at bedtime. Patient not taking: Reported on 03/30/2018 06/13/13   Deneise Lever, MD  Fluticasone Furoate-Vilanterol (BREO ELLIPTA) 100-25 MCG/INH AEPB Inhale 1 puff into the lungs daily. RINSE MOUTH WELL AFTER USE Patient not taking: Reported on 03/30/2018 06/13/13   Deneise Lever, MD    Family History Family History  Problem Relation Age of Onset  .  Heart disease Father   . Heart murmur Sister   . Glaucoma Sister   . Allergies Sister     Social History Social History   Tobacco Use  . Smoking status: Never Smoker  . Smokeless tobacco: Never Used  Substance Use Topics  . Alcohol use: Yes    Comment: occasional wine(red)  . Drug use: No     Allergies   Sulfa antibiotics; Lisinopril; Other; and Statins   Review of Systems Review of Systems  Gastrointestinal: Positive for abdominal pain and nausea.  All other systems reviewed and are negative.    Physical Exam Updated Vital Signs BP (!) 121/55 (BP Location: Left Arm)   Pulse 63   Temp 98.8 F (37.1 C) (Oral)   Resp 18   SpO2 96%   Physical Exam Vitals signs  and nursing note reviewed.  Constitutional:      Appearance: She is well-developed.  HENT:     Head: Normocephalic and atraumatic.     Mouth/Throat:     Mouth: Mucous membranes are moist.     Pharynx: Oropharynx is clear.  Eyes:     Extraocular Movements: Extraocular movements intact.     Pupils: Pupils are equal, round, and reactive to light.  Cardiovascular:     Rate and Rhythm: Normal rate and regular rhythm.  Pulmonary:     Effort: Pulmonary effort is normal.     Breath sounds: Normal breath sounds.  Abdominal:     General: Abdomen is flat.     Tenderness: There is abdominal tenderness in the right upper quadrant, right lower quadrant, epigastric area and left lower quadrant.  Skin:    General: Skin is warm and dry.     Capillary Refill: Capillary refill takes less than 2 seconds.  Neurological:     General: No focal deficit present.     Mental Status: She is alert and oriented to person, place, and time.  Psychiatric:        Mood and Affect: Mood normal.        Behavior: Behavior normal.      ED Treatments / Results  Labs (all labs ordered are listed, but only abnormal results are displayed) Labs Reviewed  COMPREHENSIVE METABOLIC PANEL - Abnormal; Notable for the following components:      Result Value   Glucose, Bld 114 (*)    BUN 35 (*)    Creatinine, Ser 1.19 (*)    Alkaline Phosphatase 16 (*)    GFR calc non Af Amer 40 (*)    GFR calc Af Amer 47 (*)    All other components within normal limits  LIPASE, BLOOD      EKG None  Radiology Ct Abdomen Pelvis W Contrast  Result Date: 03/30/2018 CLINICAL DATA:  Abdominal right lower quadrant pain. Leukocytosis. EXAM: CT ABDOMEN AND PELVIS WITH CONTRAST TECHNIQUE: Multidetector CT imaging of the abdomen and pelvis was performed using the standard protocol following bolus administration of intravenous contrast. CONTRAST:  21mL OMNIPAQUE IOHEXOL 300 MG/ML  SOLN COMPARISON:  None. FINDINGS: Lower chest: Calcified  lower paraesophageal lymph node is associated with a calcified granuloma in the posterior right lower lobe. There is left base collapse/consolidation with dependent atelectasis in the right lower lobe. Hepatobiliary: No focal abnormality within the liver parenchyma. There is no evidence for gallstones, gallbladder wall thickening, or pericholecystic fluid. No intrahepatic or extrahepatic biliary dilation. Pancreas: No focal mass lesion. No dilatation of the main duct. No intraparenchymal cyst. No peripancreatic edema. Spleen: Calcified  granulomata. Adrenals/Urinary Tract: No adrenal nodule or mass. Right kidney unremarkable. 14 mm cyst identified lower pole left kidney No evidence for hydroureter. The urinary bladder appears normal for the degree of distention. Stomach/Bowel: Tiny hiatal hernia. Stomach otherwise unremarkable. Duodenum is normally positioned as is the ligament of Treitz. No small bowel wall thickening. No small bowel dilatation. The terminal ileum is normal. The appendix is normal. No gross colonic mass. No colonic wall thickening. Diverticular changes are seen in the sigmoid colon with fluid in the root of the sigmoid mesocolon in a small amount of pericolonic fluid. Several tiny gas bubbles are seen in the fluid. Vascular/Lymphatic: There is abdominal aortic atherosclerosis without aneurysm. There is no gastrohepatic or hepatoduodenal ligament lymphadenopathy. No intraperitoneal or retroperitoneal lymphadenopathy. No pelvic sidewall lymphadenopathy. Reproductive: Uterus surgically absent.  There is no adnexal mass. Other: No intraperitoneal free fluid. Musculoskeletal: Pelvic floor laxity evident. No worrisome lytic or sclerotic osseous abnormality. Convex leftward lumbar scoliosis evident. IMPRESSION: Diverticular changes in the sigmoid colon with adjacent fluid containing several tiny gas bubbles. Imaging features are compatible with acute diverticulitis with contained, microperforation. No  evidence for organized abscess at this time. Left lower lobe collapse/consolidation.  Pneumonia not excluded. Granulomatous disease in the chest and spleen. Electronically Signed   By: Misty Stanley M.D.   On: 03/30/2018 18:16    Procedures Procedures (including critical care time)  Medications Ordered in ED Medications  sodium chloride (PF) 0.9 % injection (has no administration in time range)  sodium chloride 0.9 % bolus 1,000 mL (0 mLs Intravenous Stopped 03/30/18 1709)  iohexol (OMNIPAQUE) 300 MG/ML solution 60 mL (60 mLs Intravenous Contrast Given 03/30/18 1728)  piperacillin-tazobactam (ZOSYN) IVPB 3.375 g (3.375 g Intravenous New Bag/Given 03/30/18 1859)  0.9 %  sodium chloride infusion ( Intravenous New Bag/Given 03/30/18 1857)  ondansetron (ZOFRAN) injection 4 mg (4 mg Intravenous Given 03/30/18 1901)  fentaNYL (SUBLIMAZE) injection 25 mcg (25 mcg Intravenous Given 03/30/18 1901)       Initial Impression / Assessment and Plan / ED Course  I have reviewed the triage vital signs and the nursing notes.  Pertinent labs & imaging results that were available during my care of the patient were reviewed by me and considered in my medical decision making (see chart for details).    Pt is now wanting something for pain.  She said it is very uncomfortable.  CT shows a questionable pneumonia, but she has no clinical signs of pneumonia.  She said she's had it before and does not feel like she has pna.   Pt is in a high risk category for her diverticulitis, so was treated with zosyn.  I think she'd benefit from IV abx to make sure she does not develop an abscess.  Pt d/w Dr. Roel Cluck (triad) for admission.  Final Clinical Impressions(s) / ED Diagnoses   Final diagnoses:  Diverticulitis of large intestine with perforation without bleeding    ED Discharge Orders    None       Isla Pence, MD 03/30/18 1931

## 2018-03-30 NOTE — ED Notes (Signed)
ED TO INPATIENT HANDOFF REPORT  Name/Age/Gender Susan Marks 85 y.o. female  Code Status Advance Directive Documentation     Most Recent Value  Type of Advance Directive  Healthcare Power of Attorney, Living will  Pre-existing out of facility DNR order (yellow form or pink MOST form)  -  "MOST" Form in Place?  -      Home/SNF/Other Home  Chief Complaint Acute Abdominal Pain / Fever   Level of Care/Admitting Diagnosis ED Disposition    ED Disposition Condition [place]: [place] [[id number]]  Level of Care: Med-Surg [16]  Diagnosis: Diverticulitis [784696]  Admitting Physician: Toy Baker [[id number]]  Attending Physician: Toy Baker [[id number]]  PT Class (Do Not Modify): Observation [104]  PT Acc Code (Do Not Modify): Observation [10022]       Medical History Past Medical History:  Diagnosis Date  . Chronic headaches    migraines  . Chronic kidney disease   . High blood pressure   . High cholesterol     Allergies Allergies  Allergen Reactions  . Sulfa Antibiotics Other (See Comments)    REACTION: swollen under eyes and cheek  . Lisinopril     cough  . Other Swelling    Preservatives in eye drops  . Statins     Leg cramps    IV Location/Drains/Wounds Patient Lines/Drains/Airways Status   Active Line/Drains/Airways    Name:   Placement date:   Placement time:   Site:   Days:   Peripheral IV 03/30/18 Right Forearm   03/30/18    1603    Forearm   less than 1          Labs/Imaging Results for orders placed or performed during the hospital encounter of 03/30/18 (from the past 48 hour(s))  Comprehensive metabolic panel     Status: Abnormal   Collection Time: 03/30/18  4:04 PM  Result Value Ref Range   Sodium 135 135 - 145 mmol/L   Potassium 4.6 3.5 - 5.1 mmol/L   Chloride 103 98 - 111 mmol/L   CO2 22 22 - 32 mmol/L   Glucose, Bld 114 (H) 70 - 99 mg/dL   BUN 35 (H) 8 - 23 mg/dL   Creatinine,  Ser 1.19 (H) 0.44 - 1.00 mg/dL   Calcium 9.3 8.9 - 10.3 mg/dL   Total Protein 7.1 6.5 - 8.1 g/dL   Albumin 4.3 3.5 - 5.0 g/dL   AST 26 15 - 41 U/L   ALT 24 0 - 44 U/L   Alkaline Phosphatase 16 (L) 38 - 126 U/L   Total Bilirubin 1.1 0.3 - 1.2 mg/dL   GFR calc non Af Amer 40 (L) >60 mL/min   GFR calc Af Amer 47 (L) >60 mL/min   Anion gap 10 5 - 15    Comment: Performed at [facility], [place] [address], [place], [place] [address]  Lipase, blood     Status: None   Collection Time: 03/30/18  4:04 PM  Result Value Ref Range   Lipase 35 11 - 51 U/L    Comment: Performed at [facility], [place] [address] [place],  [address]   Ct Abdomen Pelvis W Contrast  Result Date: 03/30/2018 CLINICAL DATA:  Abdominal right lower quadrant pain. Leukocytosis. EXAM: CT ABDOMEN AND PELVIS WITH CONTRAST TECHNIQUE: Multidetector CT imaging of the abdomen and pelvis was performed using the standard protocol following bolus administration of intravenous contrast. CONTRAST:  19mL  OMNIPAQUE IOHEXOL 300 MG/ML  SOLN COMPARISON:  None. FINDINGS: Lower chest: Calcified lower paraesophageal lymph node is associated with a calcified granuloma in the posterior right lower lobe. There is left base collapse/consolidation with dependent atelectasis in the right lower lobe. Hepatobiliary: No focal abnormality within the liver parenchyma. There is no evidence for gallstones, gallbladder wall thickening, or pericholecystic fluid. No intrahepatic or extrahepatic biliary dilation. Pancreas: No focal mass lesion. No dilatation of the main duct. No intraparenchymal cyst. No peripancreatic edema. Spleen: Calcified granulomata. Adrenals/Urinary Tract: No adrenal nodule or mass. Right kidney unremarkable. 14 mm cyst identified lower pole left kidney No evidence for hydroureter. The urinary bladder appears normal for the degree of distention. Stomach/Bowel: Tiny hiatal hernia. Stomach otherwise  unremarkable. Duodenum is normally positioned as is the ligament of Treitz. No small bowel wall thickening. No small bowel dilatation. The terminal ileum is normal. The appendix is normal. No gross colonic mass. No colonic wall thickening. Diverticular changes are seen in the sigmoid colon with fluid in the root of the sigmoid mesocolon in a small amount of pericolonic fluid. Several tiny gas bubbles are seen in the fluid. Vascular/Lymphatic: There is abdominal aortic atherosclerosis without aneurysm. There is no gastrohepatic or hepatoduodenal ligament lymphadenopathy. No intraperitoneal or retroperitoneal lymphadenopathy. No pelvic sidewall lymphadenopathy. Reproductive: Uterus surgically absent.  There is no adnexal mass. Other: No intraperitoneal free fluid. Musculoskeletal: Pelvic floor laxity evident. No worrisome lytic or sclerotic osseous abnormality. Convex leftward lumbar scoliosis evident. IMPRESSION: Diverticular changes in the sigmoid colon with adjacent fluid containing several tiny gas bubbles. Imaging features are compatible with acute diverticulitis with contained, microperforation. No evidence for organized abscess at this time. Left lower lobe collapse/consolidation.  Pneumonia not excluded. Granulomatous disease in the chest and spleen. Electronically Signed   By: Misty Stanley M.D.   On: 03/30/2018 18:16   None  Pending Labs Unresulted Labs (From admission, onward)   None      Vitals/Pain Today's Vitals   03/30/18 1517 03/30/18 1904  BP: 118/73 (!) 121/55  Pulse: 79 63  Resp: 18 18  Temp: 98.8 F (37.1 C)   TempSrc: Oral   SpO2: 97% 96%    Isolation Precautions No active isolations  Medications Medications  sodium chloride (PF) 0.9 % injection (has no administration in time range)  sodium chloride 0.9 % bolus 1,000 mL (0 mLs Intravenous Stopped 03/30/18 1709)  iohexol (OMNIPAQUE) 300 MG/ML solution 60 mL (60 mLs Intravenous Contrast Given 03/30/18 1728)   piperacillin-tazobactam (ZOSYN) IVPB 3.375 g (0 g Intravenous Stopped 03/30/18 2005)  0.9 %  sodium chloride infusion ( Intravenous New Bag/Given 03/30/18 1857)  ondansetron (ZOFRAN) injection 4 mg (4 mg Intravenous Given 03/30/18 1901)  fentaNYL (SUBLIMAZE) injection 25 mcg (25 mcg Intravenous Given 03/30/18 1901)    Mobility walks with device

## 2018-03-30 NOTE — ED Triage Notes (Signed)
Pt sent from [facility] clinic for abd pains, elevated WBC 15. Reports last BM was Thursday. Denies urinary problems or n/v.

## 2018-03-30 NOTE — Progress Notes (Signed)
Pharmacy Antibiotic Note  Susan Marks is a 85 y.o. female presented from [facility] to the ED on 03/30/2018 for workup of abd pain. Abc CT on 1/11 showed findings consistent with acute diverticulitis with contained microperforation.  To start zosyn for intra-abdominal infection.  Plan: - zosyn 3.375 gm IV q8h (infuse over 4 hrs) _________________________________________   Temp (24hrs), Avg:98.8 F (37.1 C), Min:98.8 F (37.1 C), Max:98.8 F (37.1 C)  Recent Labs  Lab 03/30/18 1604  CREATININE 1.19*    CrCl cannot be calculated (Unknown ideal weight.).    Allergies  Allergen Reactions  . Sulfa Antibiotics Other (See Comments)    REACTION: swollen under eyes and cheek  . Lisinopril     cough  . Other Swelling    Preservatives in eye drops  . Statins     Leg cramps    Thank you for allowing pharmacy to be a part of this patient's care.  Lynelle Doctor 03/30/2018 9:00 PM

## 2018-03-30 NOTE — H&P (Signed)
Susan Marks DOB: 1925/09/23 DOA: 03/30/2018     PCP: Jonathon Jordan, MD   Outpatient Specialists:  CARDS:  Dr.[facility] Dr.  Nuala Alpha Patient arrived to ER on 03/30/18 at 1509  Patient coming from: home Lives  With family    Chief Complaint:  Chief Complaint  Patient presents with  . elevated WBC  . Abdominal Pain    HPI: Susan Marks is a 85 y.o. female with medical history significant of LBBB   hypertension, hyperlipidemia, primary esophageal dysphagia  Presented with abdominal pain came to [facility] clinic today was noted to have elevated white blood cell count up to 15 and fever 100.0 reports constipation last BM was 2 days ago. She took a a bowel softener but no help.  otherwise no nausea only dry heaving no urinary complaints. Patient endorsing some nausea vomiting left lower quadrant abdominal pain started around 2 AM when she woke up with some nausea.  Similar to prior attacks of diverticulitis she has not had any fevers or chills.   Regarding pertinent Chronic problems :   nuclear stress test on 03/19/14 and everything was fine.   History of hypertension for which she takes Cozaar Had swallow evaluation done in 2015 that showed primary esophageal dysphasia treatment recommended at this time regular thin diet Last colonoscopy was 8 years ago.   While in ER: CXR was ordered but patient prefers to wait and do it in the morning The following Work up has been ordered so far:  Orders Placed This Encounter  Procedures  . CT Abdomen Pelvis W Contrast  . Comprehensive metabolic panel  . Lipase, blood  . Consult to hospitalist  . Insert peripheral IV     Following Medications were ordered in ER: Medications  sodium chloride (PF) 0.9 % injection (has no administration in time range)  piperacillin-tazobactam (ZOSYN) IVPB 3.375 g (3.375 g Intravenous New Bag/Given 03/30/18 1859)  sodium chloride 0.9 % bolus 1,000 mL (0 mLs  Intravenous Stopped 03/30/18 1709)  iohexol (OMNIPAQUE) 300 MG/ML solution 60 mL (60 mLs Intravenous Contrast Given 03/30/18 1728)  0.9 %  sodium chloride infusion ( Intravenous New Bag/Given 03/30/18 1857)  ondansetron (ZOFRAN) injection 4 mg (4 mg Intravenous Given 03/30/18 1901)  fentaNYL (SUBLIMAZE) injection 25 mcg (25 mcg Intravenous Given 03/30/18 1901)    Significant initial  Findings: Abnormal Labs Reviewed  COMPREHENSIVE METABOLIC PANEL - Abnormal; Notable for the following components:      Result Value   Glucose, Bld 114 (*)    BUN 35 (*)    Creatinine, Ser 1.19 (*)    Alkaline Phosphatase 16 (*)    GFR calc non Af Amer 40 (*)    GFR calc Af Amer 47 (*)    All other components within normal limits    Lactic Acid, Venous No results found for: LATICACIDVEN  Na 135 K 4.6   Cr   stable,  Lab Results  Component Value Date   CREATININE 1.19 (H) 03/30/2018      WBC 15.8  HG/HCT 12.2/37.8 PLT 314  Troponin (Point of Care Test) No results for input(s): TROPIPOC in the last 72 hours.   Lipase 35     UA done at urgent care at [facility]  no evidence of UTI        CXR - pt declined can reorder in AM  CTabd/pelvis - acute diverticulitis with contained, microperforation. Left lower lobe collapse/consolidation.  ECG:  Not obtained  ED Triage Vitals [03/30/18 1517]  Enc Vitals Group     BP 118/73     Pulse Rate 79     Resp 18     Temp 98.8 F (37.1 C)     Temp Source Oral     SpO2 97 %     Weight      Height      Head Circumference      Peak Flow      Pain Score      Pain Loc      Pain Edu?      Excl. in [place]?   BJSE(83)@       Latest  Blood pressure (!) 121/55, pulse 63, temperature 98.8 F (37.1 C), temperature source Oral, resp. rate 18, SpO2 96 %.   Hospitalist was called for admission for Diverticulits   Review of Systems:    Pertinent positives include:  Fatigue, abdominal pain, nausea,  Fevers, chills Constitutional:  No weight loss, night  sweats, , weight loss  HEENT:  No headaches, Difficulty swallowing,Tooth/dental problems,Sore throat,  No sneezing, itching, ear ache, nasal congestion, post nasal drip,  Cardio-vascular:  No chest pain, Orthopnea, PND, anasarca, dizziness, palpitations.no Bilateral lower extremity swelling  GI:  No heartburn, indigestion, vomiting, diarrhea, change in bowel habits, loss of appetite, melena, blood in stool, hematemesis Resp:  no shortness of breath at rest. No dyspnea on exertion, No excess mucus, no productive cough, No non-productive cough, No coughing up of blood.No change in color of mucus.No wheezing. Skin:  no rash or lesions. No jaundice GU:  no dysuria, change in color of urine, no urgency or frequency. No straining to urinate.  No flank pain.  Musculoskeletal:  No joint pain or no joint swelling. No decreased range of motion. No back pain.  Psych:  No change in mood or affect. No depression or anxiety. No memory loss.  Neuro: no localizing neurological complaints, no tingling, no weakness, no double vision, no gait abnormality, no slurred speech, no confusion  All systems reviewed and apart from [place] all are negative  Past Medical History:   Past Medical History:  Diagnosis Date  . Chronic headaches    migraines  . Chronic kidney disease   . High blood pressure   . High cholesterol       Past Surgical History:  Procedure Laterality Date  . VESICOVAGINAL FISTULA CLOSURE W/ TAH      Social History:  Ambulatory cane,      reports that she has never smoked. She has never used smokeless tobacco. She reports current alcohol use. She reports that she does not use drugs.     Family History:   Family History  Problem Relation Age of Onset  . Heart disease Father   . Heart murmur Sister   . Glaucoma Sister   . Allergies Sister     Allergies: Allergies  Allergen Reactions  . Sulfa Antibiotics Other (See Comments)    REACTION: swollen under eyes and cheek    . Lisinopril     cough  . Other Swelling    Preservatives in eye drops  . Statins     Leg cramps     Prior to Admission medications   Medication Sig Start Date End Date Taking? Authorizing Provider  acetaminophen (TYLENOL) 500 MG tablet Take 1,000 mg by mouth every 6 (six) hours as needed for mild pain.   Yes [provider]  aspirin 81 MG tablet Take 81 mg by mouth  daily.   Yes [provider]  Calcium-Vitamin D-Vitamin K (VIACTIV PO) Take 2 capsules by mouth daily.   Yes [provider]  cholecalciferol (VITAMIN D) 400 units TABS tablet Take 400 Units by mouth.   Yes [provider]  cyanocobalamin 2000 MCG tablet Take 2,000 mcg by mouth daily.   Yes [provider]  denosumab (PROLIA) 60 MG/ML SOLN injection Inject 60 mg into the skin every 6 (six) months. Administer in upper arm, thigh, or abdomen   Yes [provider]  famotidine (PEPCID) 20 MG tablet Take 20 mg by mouth at bedtime.   Yes [provider]  fenofibrate micronized (LOFIBRA) 200 MG capsule Take 200 mg by mouth at bedtime.   Yes [provider]  ibuprofen (ADVIL,MOTRIN) 200 MG tablet Take 400 mg by mouth as needed for moderate pain.    Yes [provider]  losartan (COZAAR) 100 MG tablet TAKE 1 TABLET BY MOUTH EVERY DAY 09/16/14  Yes Turner, Eber Hong, MD  polyethylene glycol (MIRALAX / GLYCOLAX) packet Take 17 g by mouth daily as needed for mild constipation.    Yes [provider]  Azelastine-Fluticasone (DYMISTA) 137-50 MCG/ACT SUSP Place 1-2 sprays into both nostrils at bedtime. Patient not taking: Reported on 03/30/2018 06/13/13   Deneise Lever, MD  Fluticasone Furoate-Vilanterol (BREO ELLIPTA) 100-25 MCG/INH AEPB Inhale 1 puff into the lungs daily. RINSE MOUTH WELL AFTER USE Patient not taking: Reported on 03/30/2018 06/13/13   Deneise Lever, MD   Physical Exam: Blood pressure (!) 121/55, pulse 63, temperature 98.8 F (37.1  C), temperature source Oral, resp. rate 18, SpO2 96 %. 1. General:  in No Acute distress well-appearing 2. Psychological: Alert and  Oriented 3. Head/ENT:     Dry Mucous Membranes                          Head Non traumatic, neck supple                            Poor Dentition 4. SKIN:   decreased Skin turgor,  Skin clean Dry and intact no rash 5. Heart: Regular rate and rhythm no  Murmur, no Rub or gallop 6. Lungs: Clear to auscultation bilaterally, no wheezes or crackles   7. Abdomen: Soft,  Left lower quadrant tenderness, Non distended   obese  bowel sounds present 8. Lower extremities: no clubbing, cyanosis, or edema 9. Neurologically Grossly intact, moving all 4 extremities equally   10. MSK: Normal range of motion   LABS:    No results for input(s): WBC, NEUTROABS, HGB, HCT, MCV, PLT in the last 168 hours. Basic Metabolic Panel: Recent Labs  Lab 03/30/18 1604  NA 135  K 4.6  CL 103  CO2 22  GLUCOSE 114*  BUN 35*  CREATININE 1.19*  CALCIUM 9.3      Recent Labs  Lab 03/30/18 1604  AST 26  ALT 24  ALKPHOS 16*  BILITOT 1.1  PROT 7.1  ALBUMIN 4.3   Recent Labs  Lab 03/30/18 1604  LIPASE 35   No results for input(s): AMMONIA in the last 168 hours.    HbA1C: No results for input(s): HGBA1C in the last 72 hours. CBG: No results for input(s): GLUCAP in the last 168 hours.    Urine analysis: No results found for: COLORURINE, APPEARANCEUR, [place], [place], [place], [place], [place], [place], [place], [place], NITRITE, LEUKOCYTESUR  Cultures: No results found for: SDES, SPECREQUEST, CULT, REPTSTATUS   Radiological Exams on Admission: Ct Abdomen Pelvis W Contrast  Result Date: 03/30/2018 CLINICAL DATA:  Abdominal right lower quadrant pain. Leukocytosis. EXAM: CT ABDOMEN AND PELVIS WITH CONTRAST TECHNIQUE: Multidetector CT imaging of the abdomen and pelvis was performed using the standard protocol following bolus administration of  intravenous contrast. CONTRAST:  66mL OMNIPAQUE IOHEXOL 300 MG/ML  SOLN COMPARISON:  None. FINDINGS: Lower chest: Calcified lower paraesophageal lymph node is associated with a calcified granuloma in the posterior right lower lobe. There is left base collapse/consolidation with dependent atelectasis in the right lower lobe. Hepatobiliary: No focal abnormality within the liver parenchyma. There is no evidence for gallstones, gallbladder wall thickening, or pericholecystic fluid. No intrahepatic or extrahepatic biliary dilation. Pancreas: No focal mass lesion. No dilatation of the main duct. No intraparenchymal cyst. No peripancreatic edema. Spleen: Calcified granulomata. Adrenals/Urinary Tract: No adrenal nodule or mass. Right kidney unremarkable. 14 mm cyst identified lower pole left kidney No evidence for hydroureter. The urinary bladder appears normal for the degree of distention. Stomach/Bowel: Tiny hiatal hernia. Stomach otherwise unremarkable. Duodenum is normally positioned as is the ligament of Treitz. No small bowel wall thickening. No small bowel dilatation. The terminal ileum is normal. The appendix is normal. No gross colonic mass. No colonic wall thickening. Diverticular changes are seen in the sigmoid colon with fluid in the root of the sigmoid mesocolon in a small amount of pericolonic fluid. Several tiny gas bubbles are seen in the fluid. Vascular/Lymphatic: There is abdominal aortic atherosclerosis without aneurysm. There is no gastrohepatic or hepatoduodenal ligament lymphadenopathy. No intraperitoneal or retroperitoneal lymphadenopathy. No pelvic sidewall lymphadenopathy. Reproductive: Uterus surgically absent.  There is no adnexal mass. Other: No intraperitoneal free fluid. Musculoskeletal: Pelvic floor laxity evident. No worrisome lytic or sclerotic osseous abnormality. Convex leftward lumbar scoliosis evident. IMPRESSION: Diverticular changes in the sigmoid colon with adjacent fluid containing  several tiny gas bubbles. Imaging features are compatible with acute diverticulitis with contained, microperforation. No evidence for organized abscess at this time. Left lower lobe collapse/consolidation.  Pneumonia not excluded. Granulomatous disease in the chest and spleen. Electronically Signed   By: Misty Stanley M.D.   On: 03/30/2018 18:16    Chart has been reviewed    Assessment/Plan   85 y.o. female with medical history significant of LBBB   hypertension, hyperlipidemia, primary esophageal dysphagia Admitted for Diverticulitis   Present on Admission: . Diverticulitis -   - Given small perforation if continues to have discomfort despite appropriate antibiotic management would obtain   general surgery consult,    - Bowel rest NPO  - Will rehydrate  - Continue IV antibiotics as patient was unable to tolerate p.o. due to significant nausea abdominal pain                started on zosyn on 03/30/18    . GERD (gastroesophageal reflux disease) -stable restart home medications when able tolerate . Benign essential HTN -restart Cozaar when able permissive hypertension for tonight . LBBB (left bundle branch block) stable chronic  abnormal imaging - possible consolidation atelectasis on abdominal CT visualized lower part of the lung patient did not want to get chest x-ray done tonight she is already going to be on antibiotics she states she is not having any cough to suggest pneumonia.  Given prior history of abnormal chest x-ray will repeat in a.m. patient is agreeable at this time Other plan as per orders.  DVT prophylaxis:  SCD  Code Status:  FULL CODE as per patient    I had personally discussed CODE STATUS with patient   Family Communication:   Family not  at  Bedside     Disposition Plan:       To home once workup is complete and patient is stable                  Consults called: none  Admission status:   Obs    Level of care        medical floor       Toy Baker 03/30/2018, 8:47 PM    [facility]  Pager [phone]   after 2 AM please page floor coverage PA If 7AM-7PM, please contact the day team taking care of the patient  [email or web address]  Password [email or web address]

## 2018-03-31 DIAGNOSIS — Z888 Allergy status to other drugs, medicaments and biological substances status: Secondary | ICD-10-CM | POA: Diagnosis not present

## 2018-03-31 DIAGNOSIS — E7800 Pure hypercholesterolemia, unspecified: Secondary | ICD-10-CM | POA: Diagnosis present

## 2018-03-31 DIAGNOSIS — R1032 Left lower quadrant pain: Secondary | ICD-10-CM | POA: Diagnosis present

## 2018-03-31 DIAGNOSIS — I129 Hypertensive chronic kidney disease with stage 1 through stage 4 chronic kidney disease, or unspecified chronic kidney disease: Secondary | ICD-10-CM | POA: Diagnosis present

## 2018-03-31 DIAGNOSIS — Z83511 Family history of glaucoma: Secondary | ICD-10-CM | POA: Diagnosis not present

## 2018-03-31 DIAGNOSIS — N189 Chronic kidney disease, unspecified: Secondary | ICD-10-CM | POA: Diagnosis not present

## 2018-03-31 DIAGNOSIS — Z791 Long term (current) use of non-steroidal anti-inflammatories (NSAID): Secondary | ICD-10-CM | POA: Diagnosis not present

## 2018-03-31 DIAGNOSIS — E785 Hyperlipidemia, unspecified: Secondary | ICD-10-CM | POA: Diagnosis present

## 2018-03-31 DIAGNOSIS — Z7982 Long term (current) use of aspirin: Secondary | ICD-10-CM | POA: Diagnosis not present

## 2018-03-31 DIAGNOSIS — I10 Essential (primary) hypertension: Secondary | ICD-10-CM | POA: Diagnosis not present

## 2018-03-31 DIAGNOSIS — K219 Gastro-esophageal reflux disease without esophagitis: Secondary | ICD-10-CM | POA: Diagnosis present

## 2018-03-31 DIAGNOSIS — N183 Chronic kidney disease, stage 3 (moderate): Secondary | ICD-10-CM | POA: Diagnosis present

## 2018-03-31 DIAGNOSIS — Z8249 Family history of ischemic heart disease and other diseases of the circulatory system: Secondary | ICD-10-CM | POA: Diagnosis not present

## 2018-03-31 DIAGNOSIS — K5900 Constipation, unspecified: Secondary | ICD-10-CM | POA: Diagnosis present

## 2018-03-31 DIAGNOSIS — Z79899 Other long term (current) drug therapy: Secondary | ICD-10-CM | POA: Diagnosis not present

## 2018-03-31 DIAGNOSIS — K5792 Diverticulitis of intestine, part unspecified, without perforation or abscess without bleeding: Secondary | ICD-10-CM

## 2018-03-31 DIAGNOSIS — Z882 Allergy status to sulfonamides status: Secondary | ICD-10-CM | POA: Diagnosis not present

## 2018-03-31 DIAGNOSIS — D71 Functional disorders of polymorphonuclear neutrophils: Secondary | ICD-10-CM | POA: Diagnosis present

## 2018-03-31 DIAGNOSIS — K5700 Diverticulitis of small intestine with perforation and abscess without bleeding: Secondary | ICD-10-CM | POA: Diagnosis not present

## 2018-03-31 DIAGNOSIS — J9819 Other pulmonary collapse: Secondary | ICD-10-CM | POA: Diagnosis present

## 2018-03-31 DIAGNOSIS — K5720 Diverticulitis of large intestine with perforation and abscess without bleeding: Secondary | ICD-10-CM | POA: Diagnosis present

## 2018-03-31 DIAGNOSIS — R1314 Dysphagia, pharyngoesophageal phase: Secondary | ICD-10-CM | POA: Diagnosis present

## 2018-03-31 DIAGNOSIS — I447 Left bundle-branch block, unspecified: Secondary | ICD-10-CM | POA: Diagnosis present

## 2018-03-31 LAB — COMPREHENSIVE METABOLIC PANEL
ALT: 19 U/L (ref 0–44)
AST: 20 U/L (ref 15–41)
Albumin: 3.4 g/dL — ABNORMAL LOW (ref 3.5–5.0)
Alkaline Phosphatase: 14 U/L — ABNORMAL LOW (ref 38–126)
Anion gap: 7 (ref 5–15)
BUN: 28 mg/dL — AB (ref 8–23)
CO2: 20 mmol/L — ABNORMAL LOW (ref 22–32)
CREATININE: 1.25 mg/dL — AB (ref 0.44–1.00)
Calcium: 8.1 mg/dL — ABNORMAL LOW (ref 8.9–10.3)
Chloride: 107 mmol/L (ref 98–111)
GFR, EST AFRICAN AMERICAN: 44 mL/min — AB (ref >60)
GFR, EST NON AFRICAN AMERICAN: 38 mL/min — AB (ref >60)
Glucose, Bld: 94 mg/dL (ref 70–99)
Potassium: 4 mmol/L (ref 3.5–5.1)
Sodium: 134 mmol/L — ABNORMAL LOW (ref 135–145)
Total Bilirubin: 1.2 mg/dL (ref 0.3–1.2)
Total Protein: 5.6 g/dL — ABNORMAL LOW (ref 6.5–8.1)

## 2018-03-31 LAB — CBC
HCT: 31.2 % — ABNORMAL LOW (ref 36.0–46.0)
Hemoglobin: 9.7 g/dL — ABNORMAL LOW (ref 12.0–15.0)
MCH: 30.9 pg (ref 26.0–34.0)
MCHC: 31.1 g/dL (ref 30.0–36.0)
MCV: 99.4 fL (ref 80.0–100.0)
NRBC: 0 % (ref 0.0–0.2)
Platelets: 232 10*3/uL (ref 150–400)
RBC: 3.14 MIL/uL — ABNORMAL LOW (ref 3.87–5.11)
RDW: 13.3 % (ref 11.5–15.5)
WBC: 9.5 10*3/uL (ref 4.0–10.5)

## 2018-03-31 LAB — PHOSPHORUS: Phosphorus: 3.2 mg/dL (ref 2.5–4.6)

## 2018-03-31 LAB — MAGNESIUM: Magnesium: 2 mg/dL (ref 1.7–2.4)

## 2018-03-31 LAB — TSH: TSH: 2.251 u[IU]/mL (ref 0.350–4.500)

## 2018-03-31 MED ORDER — BISACODYL 10 MG RE SUPP
10.0000 mg | Freq: Once | RECTAL | Status: AC
  Administered 2018-03-31: 10 mg via RECTAL
  Filled 2018-03-31: qty 1

## 2018-03-31 NOTE — Progress Notes (Addendum)
PROGRESS NOTE  Susan Marks AUQ:[id number] DOB: 1926-01-19 DOA: 03/30/2018 PCP: Jonathon Jordan, MD  HPI/Recap of past 35 hours:  85 year old female with medical history significant for left bundle branch block hypertension, hyperlipidemia primary esophageal dysphagia admitted for diverticulitis.  Patient is currently on bowel rest with n.p.o. and rehydration and IV antibiotics and she was started on Zosyn.  Subjective: Patient stated that she is doing much better her pain is much reduced compared to when she first came in.  She however complain she has not had any bowel movement since Thursday.  Denies any fever or chills.  Requesting for something to help her make a bowel movement  Assessment/Plan: Active Problems:   GERD (gastroesophageal reflux disease)   Benign essential HTN   LBBB (left bundle branch block)   Diverticulitis  1.  Diverticulitis with microperforation patient is still n.p.o. and requiring IV antibiotics and an IV hydration continue Zosyn and normal saline IV.  Also patient will require IV antiemetics for the nausea  2.  Benign hypertension, stable.  Blood pressure is on hold since she is n.p.o. but will monitor and treated with IV antibiotic if needed we will resume Cozaar when able to take by mouth.  3.  Left bundle branch block stable chronic  4.  Gastric esophageal reflux we will continue IV Protonix  Code Status: Full  Severity of Illness: The appropriate patient status was changed to inpatient.  Inpatient status is judged to be reasonable and necessary in order to provide the required intensity of service to ensure the patient's safety. The patient's presenting symptoms, physical exam findings, and initial radiographic and laboratory data in the context of their medical condition is felt to place them at decreased risk for further clinical deterioration. Furthermore, it is anticipated that the patient will be medically stable for discharge from the hospital  within 2 midnights of admission. The following factors support the patient status of observation.   " The patient's presenting symptoms include abdominal pain. " The physical exam findings include tenderness to palpation in the lower abdomen. " The initial radiographic and laboratory data are fatty with micro with microperforation.  Requiring patient to be n.p.o. needing IV antibiotics     Family Communication: None at bedside  Disposition Plan: Home when stable   Consultants:  None  Procedures:  None  Antimicrobials: Continuous Infusions: . piperacillin-tazobactam (ZOSYN)  IV 3.375 g (03/31/18 0845)      DVT prophylaxis: SCD   Objective: Vitals:   03/30/18 2117 03/31/18 0143 03/31/18 0504 03/31/18 0906  BP: (!) 143/62 (!) 109/48 (!) 118/53 (!) 108/92  Pulse: (!) 56 62 (!) 56 (!) 56  Resp: 15 16 16 17   Temp: 97.9 F (36.6 C) 99.3 F (37.4 C) 98.2 F (36.8 C) 98.6 F (37 C)  TempSrc: Oral Oral Oral Oral  SpO2: 97% 93% 94% 95%  Weight: 57.2 kg     Height: 4\' 11"  (1.499 m)       Intake/Output Summary (Last 24 hours) at 03/31/2018 1157 Last data filed at 03/31/2018 1007 Gross per 24 hour  Intake 807.79 ml  Output 450 ml  Net 357.79 ml   Filed Weights   03/30/18 2105 03/30/18 2117  Weight: 54.4 kg 57.2 kg   Body mass index is 25.45 kg/m.  Exam:  . General: 85 y.o. year-old female well developed well nourished in no acute distress.  Alert and oriented x3. . Cardiovascular: Regular rate and rhythm with no rubs or gallops.  No thyromegaly  or JVD noted.   Marland Kitchen Respiratory: Clear to auscultation with no wheezes or rales. Good inspiratory effort. . Abdomen: Soft lower quadrant tenderness which is much improved per patient nondistended with normal bowel sounds x4 quadrants. . Musculoskeletal: No lower extremity edema. 2/4 pulses in all 4 extremities. . Skin: No ulcerative lesions noted or rashes, . Psychiatry: Mood is appropriate for condition and  setting    Data Reviewed: CBC: Recent Labs  Lab 03/31/18 0350  WBC 9.5  HGB 9.7*  HCT 31.2*  MCV 99.4  PLT 025   Basic Metabolic Panel: Recent Labs  Lab 03/30/18 1604 03/31/18 0350  NA 135 134*  K 4.6 4.0  CL 103 107  CO2 22 20*  GLUCOSE 114* 94  BUN 35* 28*  CREATININE 1.19* 1.25*  CALCIUM 9.3 8.1*  MG  --  2.0  PHOS  --  3.2   GFR: Estimated Creatinine Clearance: 23 mL/min (A) (by C-G formula based on SCr of 1.25 mg/dL (H)). Liver Function Tests: Recent Labs  Lab 03/30/18 1604 03/31/18 0350  AST 26 20  ALT 24 19  ALKPHOS 16* 14*  BILITOT 1.1 1.2  PROT 7.1 5.6*  ALBUMIN 4.3 3.4*   Recent Labs  Lab 03/30/18 1604  LIPASE 35   No results for input(s): AMMONIA in the last 168 hours. Coagulation Profile: No results for input(s): INR, PROTIME in the last 168 hours. Cardiac Enzymes: No results for input(s): CKTOTAL, CKMB, CKMBINDEX, TROPONINI in the last 168 hours. BNP (last 3 results) No results for input(s): PROBNP in the last 8760 hours. HbA1C: No results for input(s): HGBA1C in the last 72 hours. CBG: No results for input(s): GLUCAP in the last 168 hours. Lipid Profile: No results for input(s): CHOL, HDL, LDLCALC, TRIG, CHOLHDL, LDLDIRECT in the last 72 hours. Thyroid Function Tests: Recent Labs    03/31/18 0350  TSH 2.251   Anemia Panel: No results for input(s): VITAMINB12, FOLATE, FERRITIN, TIBC, IRON, RETICCTPCT in the last 72 hours. Urine analysis: No results found for: COLORURINE, APPEARANCEUR, LABSPEC, PHURINE, GLUCOSEU, HGBUR, BILIRUBINUR, KETONESUR, PROTEINUR, UROBILINOGEN, NITRITE, LEUKOCYTESUR Sepsis Labs: @LABRCNTIP (procalcitonin:4,lacticidven:4)  )No results found for this or any previous visit (from the past 240 hour(s)).    Studies: Ct Abdomen Pelvis W Contrast  Result Date: 03/30/2018 CLINICAL DATA:  Abdominal right lower quadrant pain. Leukocytosis. EXAM: CT ABDOMEN AND PELVIS WITH CONTRAST TECHNIQUE: Multidetector CT  imaging of the abdomen and pelvis was performed using the standard protocol following bolus administration of intravenous contrast. CONTRAST:  33mL OMNIPAQUE IOHEXOL 300 MG/ML  SOLN COMPARISON:  None. FINDINGS: Lower chest: Calcified lower paraesophageal lymph node is associated with a calcified granuloma in the posterior right lower lobe. There is left base collapse/consolidation with dependent atelectasis in the right lower lobe. Hepatobiliary: No focal abnormality within the liver parenchyma. There is no evidence for gallstones, gallbladder wall thickening, or pericholecystic fluid. No intrahepatic or extrahepatic biliary dilation. Pancreas: No focal mass lesion. No dilatation of the main duct. No intraparenchymal cyst. No peripancreatic edema. Spleen: Calcified granulomata. Adrenals/Urinary Tract: No adrenal nodule or mass. Right kidney unremarkable. 14 mm cyst identified lower pole left kidney No evidence for hydroureter. The urinary bladder appears normal for the degree of distention. Stomach/Bowel: Tiny hiatal hernia. Stomach otherwise unremarkable. Duodenum is normally positioned as is the ligament of Treitz. No small bowel wall thickening. No small bowel dilatation. The terminal ileum is normal. The appendix is normal. No gross colonic mass. No colonic wall thickening. Diverticular changes are seen in the  sigmoid colon with fluid in the root of the sigmoid mesocolon in a small amount of pericolonic fluid. Several tiny gas bubbles are seen in the fluid. Vascular/Lymphatic: There is abdominal aortic atherosclerosis without aneurysm. There is no gastrohepatic or hepatoduodenal ligament lymphadenopathy. No intraperitoneal or retroperitoneal lymphadenopathy. No pelvic sidewall lymphadenopathy. Reproductive: Uterus surgically absent.  There is no adnexal mass. Other: No intraperitoneal free fluid. Musculoskeletal: Pelvic floor laxity evident. No worrisome lytic or sclerotic osseous abnormality. Convex leftward  lumbar scoliosis evident. IMPRESSION: Diverticular changes in the sigmoid colon with adjacent fluid containing several tiny gas bubbles. Imaging features are compatible with acute diverticulitis with contained, microperforation. No evidence for organized abscess at this time. Left lower lobe collapse/consolidation.  Pneumonia not excluded. Granulomatous disease in the chest and spleen. Electronically Signed   By: Misty Stanley M.D.   On: 03/30/2018 18:16    Scheduled Meds: . losartan  100 mg Oral Daily    Continuous Infusions: . piperacillin-tazobactam (ZOSYN)  IV 3.375 g (03/31/18 0845)     LOS: 0 days     Cristal Deer, MD [facility]  To reach me or the doctor on call, go to: [email or web address] Password [email or web address]  03/31/2018, 11:57 AM

## 2018-04-01 NOTE — Progress Notes (Signed)
Pharmacy Antibiotic Note  Susan Marks is a 85 y.o. female presented from [facility] to the ED on 03/30/2018 for workup of abd pain. Abc CT on 1/11 showed findings consistent with acute diverticulitis with contained microperforation.  To start zosyn for intra-abdominal infection.  Plan:  Continue Zosyn 3.375 gm IV q8h by extended infusion  Given no TDM required, Pharmacy will sign off, following peripherally for renal adjustments (no Cx obtained) _________________________________________   Temp (24hrs), Avg:98.3 F (36.8 C), Min:98 F (36.7 C), Max:98.5 F (36.9 C)  Recent Labs  Lab 03/30/18 1604 03/31/18 0350  WBC  --  9.5  CREATININE 1.19* 1.25*    Estimated Creatinine Clearance: 23 mL/min (A) (by C-G formula based on SCr of 1.25 mg/dL (H)).    Allergies  Allergen Reactions  . Sulfa Antibiotics Other (See Comments)    REACTION: swollen under eyes and cheek  . Lisinopril     cough  . Other Swelling    Preservatives in eye drops  . Statins     Leg cramps    Thank you for allowing pharmacy to be a part of this patient's care.  Reuel Boom, PharmD, BCPS [phone] 04/01/2018, 11:35 AM

## 2018-04-01 NOTE — Progress Notes (Signed)
PROGRESS NOTE    Susan Marks  NOI:[id number] DOB: February 05, 1926 DOA: 03/30/2018 PCP: Jonathon Jordan, MD  Outpatient Specialists:   Brief Narrative:  85 year old female with medical history significant for left bundle branch block hypertension, hyperlipidemia primary esophageal dysphagia admitted for diverticulitis.  Patient is currently on bowel rest with n.p.o. and rehydration and IV antibiotics (Zosyn).  CT scan of the abdomen and pelvis revealed "Diverticular changes in the sigmoid colon with adjacent fluid containing several tiny gas bubbles. Imaging features are compatible with acute diverticulitis with contained, microperforation. No evidence for organized abscess at this time.  Left lower lobe collapse/consolidation.  Pneumonia not excluded.  Granulomatous disease in the chest and spleen".   Assessment & Plan:   Active Problems:   GERD (gastroesophageal reflux disease)   Benign essential HTN   LBBB (left bundle branch block)   Diverticulitis  Diverticulitis with microperforation: Trial of clear liquids.   Monitor symptoms closely.   Repeat CBC with differential in the morning.   Low threshold to repeat CT scan of abdomen and pelvis for worsening symptoms.   Continue antibiotics for now.    Benign hypertension: Controlled. Continue to monitor closely.  Left bundle branch block:  Gastric esophageal reflux:  Stable.     DVT prophylaxis: SCD Code Status: Full code Family Communication:  Disposition Plan: Home eventually   Consultants:   None  Procedures:   None  Antimicrobials:   IV Zosyn   Subjective: Left lower abdominal pain is improving. No nausea vomiting  Objective: Vitals:   04/01/18 0137 04/01/18 0630 04/01/18 0906 04/01/18 1306  BP: (!) 129/59 (!) 104/47 (!) 107/56 (!) 118/55  Pulse: 69 (!) 59 (!) 57 (!) 55  Resp: 17 18 17 18   Temp: 98 F (36.7 C) 98.4 F (36.9 C) 98.2 F (36.8 C) 97.8 F (36.6 C)  TempSrc: Oral Oral Oral Oral    SpO2: 93% 93% 98% 99%  Weight:      Height:        Intake/Output Summary (Last 24 hours) at 04/01/2018 1354 Last data filed at 04/01/2018 0944 Gross per 24 hour  Intake 160.58 ml  Output 800 ml  Net -639.42 ml   Filed Weights   03/30/18 2105 03/30/18 2117  Weight: 54.4 kg 57.2 kg    Examination:  General exam: Appears calm and comfortable  Respiratory system: Clear to auscultation. Cardiovascular system: S1 & S2 Gastrointestinal system: Abdomen is soft, with vague left lower quadrant discomfort.  Organs are difficult to assess.  Central nervous system: Alert and oriented. No focal neurological deficits. Extremities: No leg edema   Data Reviewed: I have personally reviewed following labs and imaging studies  CBC: Recent Labs  Lab 03/31/18 0350  WBC 9.5  HGB 9.7*  HCT 31.2*  MCV 99.4  PLT 694   Basic Metabolic Panel: Recent Labs  Lab 03/30/18 1604 03/31/18 0350  NA 135 134*  K 4.6 4.0  CL 103 107  CO2 22 20*  GLUCOSE 114* 94  BUN 35* 28*  CREATININE 1.19* 1.25*  CALCIUM 9.3 8.1*  MG  --  2.0  PHOS  --  3.2   GFR: Estimated Creatinine Clearance: 23 mL/min (A) (by C-G formula based on SCr of 1.25 mg/dL (H)). Liver Function Tests: Recent Labs  Lab 03/30/18 1604 03/31/18 0350  AST 26 20  ALT 24 19  ALKPHOS 16* 14*  BILITOT 1.1 1.2  PROT 7.1 5.6*  ALBUMIN 4.3 3.4*   Recent Labs  Lab 03/30/18 1604  LIPASE 35   No results for input(s): AMMONIA in the last 168 hours. Coagulation Profile: No results for input(s): INR, PROTIME in the last 168 hours. Cardiac Enzymes: No results for input(s): CKTOTAL, CKMB, CKMBINDEX, TROPONINI in the last 168 hours. BNP (last 3 results) No results for input(s): PROBNP in the last 8760 hours. HbA1C: No results for input(s): HGBA1C in the last 72 hours. CBG: No results for input(s): GLUCAP in the last 168 hours. Lipid Profile: No results for input(s): CHOL, HDL, LDLCALC, TRIG, CHOLHDL, LDLDIRECT in the last 72  hours. Thyroid Function Tests: Recent Labs    03/31/18 0350  TSH 2.251   Anemia Panel: No results for input(s): VITAMINB12, FOLATE, FERRITIN, TIBC, IRON, RETICCTPCT in the last 72 hours. Urine analysis: No results found for: COLORURINE, APPEARANCEUR, LABSPEC, PHURINE, GLUCOSEU, HGBUR, BILIRUBINUR, KETONESUR, PROTEINUR, UROBILINOGEN, NITRITE, LEUKOCYTESUR Sepsis Labs: @LABRCNTIP (procalcitonin:4,lacticidven:4)  )No results found for this or any previous visit (from the past 240 hour(s)).       Radiology Studies: Ct Abdomen Pelvis W Contrast  Result Date: 03/30/2018 CLINICAL DATA:  Abdominal right lower quadrant pain. Leukocytosis. EXAM: CT ABDOMEN AND PELVIS WITH CONTRAST TECHNIQUE: Multidetector CT imaging of the abdomen and pelvis was performed using the standard protocol following bolus administration of intravenous contrast. CONTRAST:  62mL OMNIPAQUE IOHEXOL 300 MG/ML  SOLN COMPARISON:  None. FINDINGS: Lower chest: Calcified lower paraesophageal lymph node is associated with a calcified granuloma in the posterior right lower lobe. There is left base collapse/consolidation with dependent atelectasis in the right lower lobe. Hepatobiliary: No focal abnormality within the liver parenchyma. There is no evidence for gallstones, gallbladder wall thickening, or pericholecystic fluid. No intrahepatic or extrahepatic biliary dilation. Pancreas: No focal mass lesion. No dilatation of the main duct. No intraparenchymal cyst. No peripancreatic edema. Spleen: Calcified granulomata. Adrenals/Urinary Tract: No adrenal nodule or mass. Right kidney unremarkable. 14 mm cyst identified lower pole left kidney No evidence for hydroureter. The urinary bladder appears normal for the degree of distention. Stomach/Bowel: Tiny hiatal hernia. Stomach otherwise unremarkable. Duodenum is normally positioned as is the ligament of Treitz. No small bowel wall thickening. No small bowel dilatation. The terminal ileum is  normal. The appendix is normal. No gross colonic mass. No colonic wall thickening. Diverticular changes are seen in the sigmoid colon with fluid in the root of the sigmoid mesocolon in a small amount of pericolonic fluid. Several tiny gas bubbles are seen in the fluid. Vascular/Lymphatic: There is abdominal aortic atherosclerosis without aneurysm. There is no gastrohepatic or hepatoduodenal ligament lymphadenopathy. No intraperitoneal or retroperitoneal lymphadenopathy. No pelvic sidewall lymphadenopathy. Reproductive: Uterus surgically absent.  There is no adnexal mass. Other: No intraperitoneal free fluid. Musculoskeletal: Pelvic floor laxity evident. No worrisome lytic or sclerotic osseous abnormality. Convex leftward lumbar scoliosis evident. IMPRESSION: Diverticular changes in the sigmoid colon with adjacent fluid containing several tiny gas bubbles. Imaging features are compatible with acute diverticulitis with contained, microperforation. No evidence for organized abscess at this time. Left lower lobe collapse/consolidation.  Pneumonia not excluded. Granulomatous disease in the chest and spleen. Electronically Signed   By: Misty Stanley M.D.   On: 03/30/2018 18:16        Scheduled Meds: . losartan  100 mg Oral Daily   Continuous Infusions: . piperacillin-tazobactam (ZOSYN)  IV 3.375 g (04/01/18 0828)     LOS: 1 day    Time spent: 25 minutes    Dana Allan, MD  [facility] Pager #: [phone] 7PM-7AM contact night coverage as above

## 2018-04-02 DIAGNOSIS — Z888 Allergy status to other drugs, medicaments and biological substances status: Secondary | ICD-10-CM

## 2018-04-02 DIAGNOSIS — K5900 Constipation, unspecified: Secondary | ICD-10-CM

## 2018-04-02 DIAGNOSIS — Z8719 Personal history of other diseases of the digestive system: Secondary | ICD-10-CM

## 2018-04-02 DIAGNOSIS — I129 Hypertensive chronic kidney disease with stage 1 through stage 4 chronic kidney disease, or unspecified chronic kidney disease: Secondary | ICD-10-CM

## 2018-04-02 DIAGNOSIS — Z881 Allergy status to other antibiotic agents status: Secondary | ICD-10-CM

## 2018-04-02 DIAGNOSIS — K5700 Diverticulitis of small intestine with perforation and abscess without bleeding: Secondary | ICD-10-CM

## 2018-04-02 DIAGNOSIS — N189 Chronic kidney disease, unspecified: Secondary | ICD-10-CM

## 2018-04-02 LAB — CBC WITH DIFFERENTIAL/PLATELET
Abs Immature Granulocytes: 0.08 10*3/uL — ABNORMAL HIGH (ref 0.00–0.07)
Basophils Absolute: 0 10*3/uL (ref 0.0–0.1)
Basophils Relative: 0 %
Eosinophils Absolute: 0.1 10*3/uL (ref 0.0–0.5)
Eosinophils Relative: 1 %
HCT: 36.2 % (ref 36.0–46.0)
Hemoglobin: 10.8 g/dL — ABNORMAL LOW (ref 12.0–15.0)
Immature Granulocytes: 1 %
Lymphocytes Relative: 6 %
Lymphs Abs: 0.5 10*3/uL — ABNORMAL LOW (ref 0.7–4.0)
MCH: 30.9 pg (ref 26.0–34.0)
MCHC: 29.8 g/dL — ABNORMAL LOW (ref 30.0–36.0)
MCV: 103.4 fL — ABNORMAL HIGH (ref 80.0–100.0)
Monocytes Absolute: 0.7 10*3/uL (ref 0.1–1.0)
Monocytes Relative: 8 %
Neutro Abs: 7.5 10*3/uL (ref 1.7–7.7)
Neutrophils Relative %: 84 %
Platelets: 266 10*3/uL (ref 150–400)
RBC: 3.5 MIL/uL — ABNORMAL LOW (ref 3.87–5.11)
RDW: 13.1 % (ref 11.5–15.5)
WBC: 8.9 10*3/uL (ref 4.0–10.5)
nRBC: 0 % (ref 0.0–0.2)

## 2018-04-02 LAB — RENAL FUNCTION PANEL
Albumin: 3.2 g/dL — ABNORMAL LOW (ref 3.5–5.0)
Anion gap: 15 (ref 5–15)
BUN: 21 mg/dL (ref 8–23)
CO2: 17 mmol/L — ABNORMAL LOW (ref 22–32)
Calcium: 7.9 mg/dL — ABNORMAL LOW (ref 8.9–10.3)
Chloride: 106 mmol/L (ref 98–111)
Creatinine, Ser: 1.02 mg/dL — ABNORMAL HIGH (ref 0.44–1.00)
GFR calc Af Amer: 56 mL/min — ABNORMAL LOW (ref >60)
GFR calc non Af Amer: 48 mL/min — ABNORMAL LOW (ref >60)
Glucose, Bld: 92 mg/dL (ref 70–99)
Phosphorus: 1.4 mg/dL — ABNORMAL LOW (ref 2.5–4.6)
Potassium: 4.1 mmol/L (ref 3.5–5.1)
Sodium: 138 mmol/L (ref 135–145)

## 2018-04-02 MED ORDER — FAMOTIDINE 20 MG PO TABS
20.0000 mg | ORAL_TABLET | Freq: Every day | ORAL | Status: DC
  Administered 2018-04-02: 20 mg via ORAL
  Filled 2018-04-02: qty 1

## 2018-04-02 NOTE — Consult Note (Signed)
[facility] for Infectious Disease  Total days of antibiotics: 4 of piptazo Reason for Consult: diverticulitis with microperforation   Referring Physician: ogbata  Active Problems:   GERD (gastroesophageal reflux disease)   Benign essential HTN   LBBB (left bundle branch block)   Diverticulitis    HPI: Susan Marks is a 85 y.o. female with hx of CKD, GERD, HTN,and constipation admitted on 1/11 for acute onset of abdominal pain, N/V, in setting of leukocytosis and fever found to have deveriticulits with signs suggesting microperforation. She was started on IV fluids, bowel rest and broad spectrum antibiotics which she has improved with WBC going from 9.5K to 8.9. she remains afebrile. Tolerating oral intake without abdominal discomfort. ID asked to weigh in on abtx recs  She reports her abdominal pain is now improved, only noticing pain at bedtime, where she is receiving pain meds  She is having formed stool.  Has remote hx of having diverticulitis in her late 65s for the first time. Treated by pcp  Past Medical History:  Diagnosis Date  . Chronic headaches    migraines  . Chronic kidney disease   . High blood pressure   . High cholesterol     Allergies:  Allergies  Allergen Reactions  . Sulfa Antibiotics Other (See Comments)    REACTION: swollen under eyes and cheek  . Lisinopril     cough  . Other Swelling    Preservatives in eye drops  . Statins     Leg cramps    MEDICATIONS: . losartan  100 mg Oral Daily    Social History   Tobacco Use  . Smoking status: Never Smoker  . Smokeless tobacco: Never Used  Substance Use Topics  . Alcohol use: Yes    Comment: occasional wine(red)  . Drug use: No    Family History  Problem Relation Age of Onset  . Heart disease Father   . Heart murmur Sister   . Glaucoma Sister   . Allergies Sister     Review of Systems  Constitutional: Negative for fever, chills, diaphoresis, activity change, appetite change,  fatigue and unexpected weight change.  HENT: Negative for congestion, sore throat, rhinorrhea, sneezing, trouble swallowing and sinus pressure.  Eyes: Negative for photophobia and visual disturbance.  Respiratory: Negative for cough, chest tightness, shortness of breath, wheezing and stridor.  Cardiovascular: Negative for chest pain, palpitations and leg swelling.  Gastrointestinal: + constipation. Had abdominal pain on admit, now better Negative for nausea, vomiting, abdominal pain, diarrhea, constipation, blood in stool, abdominal distention and anal bleeding.  Genitourinary: Negative for dysuria, hematuria, flank pain and difficulty urinating.  Musculoskeletal: Negative for myalgias, back pain, joint swelling, arthralgias and gait problem.  Skin: Negative for color change, pallor, rash and wound.  Neurological: Negative for dizziness, tremors, weakness and light-headedness.  Hematological: Negative for adenopathy. Does not bruise/bleed easily.  Psychiatric/Behavioral: Negative for behavioral problems, confusion, sleep disturbance, dysphoric mood, decreased concentration and agitation.     OBJECTIVE: Temp:  [97.5 F (36.4 C)-99 F (37.2 C)] 97.5 F (36.4 C) (01/14 1307) Pulse Rate:  [57-64] 58 (01/14 1307) Resp:  [16] 16 (01/14 1307) BP: (120-139)/(52-64) 139/62 (01/14 1307) SpO2:  [94 %-99 %] 99 % (01/14 1307) Physical Exam  Constitutional:  oriented to person, place, and time. appears younger than stated age, well-developed and well-nourished. No distress.  HENT: [place]/AT, PERRLA, no scleral icterus Mouth/Throat: Oropharynx is clear and moist. No oropharyngeal exudate.  Cardiovascular: Normal rate, regular rhythm and  normal heart sounds. Exam reveals no gallop and no friction rub.  No murmur heard.  Pulmonary/Chest: Effort normal and breath sounds normal. No respiratory distress.  has no wheezes.  Neck = supple, no nuchal rigidity Abdominal: Soft. Bowel sounds are normal.  exhibits  no distension. There is mild tenderness on deep palpation to LLQ  Lymphadenopathy: no cervical adenopathy. No axillary adenopathy Neurological: alert and oriented to person, place, and time.  Skin: Skin is warm and dry. No rash noted. No erythema.  Psychiatric: a normal mood and affect.  behavior is normal.    LABS: Results for orders placed or performed during the hospital encounter of 03/30/18 (from the past 48 hour(s))  CBC with Differential/Platelet     Status: Abnormal   Collection Time: 04/02/18  5:20 AM  Result Value Ref Range   WBC 8.9 4.0 - 10.5 K/uL   RBC 3.50 (L) 3.87 - 5.11 MIL/uL   Hemoglobin 10.8 (L) 12.0 - 15.0 g/dL   HCT 36.2 36.0 - 46.0 %   MCV 103.4 (H) 80.0 - 100.0 fL   MCH 30.9 26.0 - 34.0 pg   MCHC 29.8 (L) 30.0 - 36.0 g/dL   RDW 13.1 11.5 - 15.5 %   Platelets 266 150 - 400 K/uL   nRBC 0.0 0.0 - 0.2 %   Neutrophils Relative % 84 %   Neutro Abs 7.5 1.7 - 7.7 K/uL   Lymphocytes Relative 6 %   Lymphs Abs 0.5 (L) 0.7 - 4.0 K/uL   Monocytes Relative 8 %   Monocytes Absolute 0.7 0.1 - 1.0 K/uL   Eosinophils Relative 1 %   Eosinophils Absolute 0.1 0.0 - 0.5 K/uL   Basophils Relative 0 %   Basophils Absolute 0.0 0.0 - 0.1 K/uL   Immature Granulocytes 1 %   Abs Immature Granulocytes 0.08 (H) 0.00 - 0.07 K/uL    Comment: Performed at [facility], [place] [address] [place], [place] [address]  Renal function panel     Status: Abnormal   Collection Time: 04/02/18  5:20 AM  Result Value Ref Range   Sodium 138 135 - 145 mmol/L   Potassium 4.1 3.5 - 5.1 mmol/L   Chloride 106 98 - 111 mmol/L   CO2 17 (L) 22 - 32 mmol/L   Glucose, Bld 92 70 - 99 mg/dL   BUN 21 8 - 23 mg/dL   Creatinine, Ser 1.02 (H) 0.44 - 1.00 mg/dL   Calcium 7.9 (L) 8.9 - 10.3 mg/dL   Phosphorus 1.4 (L) 2.5 - 4.6 mg/dL   Albumin 3.2 (L) 3.5 - 5.0 g/dL   GFR calc non Af Amer 48 (L) >60 mL/min   GFR calc Af Amer 56 (L) >60 mL/min   Anion gap 15 5 - 15    Comment: Performed at  [facility], [place] [address] [place], [place] [address]    MICRO: none IMAGING: Ct showed Diverticular changes in the sigmoid colon with adjacent fluid containing several tiny gas bubbles. Imaging features are compatible with acute diverticulitis with contained, microperforation. No evidence for organized abscess at this time.   Assessment/Plan:  Diverticulitis with microperforation, currently on piptazo.  - continue with piptazo while hospitalized, can transition to oral amox/clav 875mg  BID x 6 days if discharged tomorrow.  Constipation = gave recs to taking miralax daily but smaller qty ( 1/3 to 1/2 capfull so that she can have regular BMs)   - would recommend to give diet modification for diverticultis

## 2018-04-02 NOTE — Progress Notes (Signed)
PROGRESS NOTE    Susan Marks  PTW:[id number] DOB: January 13, 1926 DOA: 03/30/2018 PCP: Jonathon Jordan, MD  Outpatient Specialists:   Brief Narrative:  85 year old female with medical history significant for left bundle branch block hypertension, hyperlipidemia primary esophageal dysphagia admitted for diverticulitis.  Patient is currently on bowel rest with n.p.o. and rehydration and IV antibiotics (Zosyn).  CT scan of the abdomen and pelvis revealed "Diverticular changes in the sigmoid colon with adjacent fluid containing several tiny gas bubbles. Imaging features are compatible with acute diverticulitis with contained, microperforation. No evidence for organized abscess at this time.  Left lower lobe collapse/consolidation.  Pneumonia not excluded.  Granulomatous disease in the chest and spleen".  04/02/2018: Patient seen.  Patient continues to improve.  Patient is tolerating p.o.  We will continue to advance diet.  Will consult infectious disease to advise on antibiotics management.  Likely discharge back home in the next 1 to 2 days.   Assessment & Plan:   Active Problems:   GERD (gastroesophageal reflux disease)   Benign essential HTN   LBBB (left bundle branch block)   Diverticulitis  Diverticulitis with microperforation: Trial of clear liquids.   Monitor symptoms closely.   Repeat CBC with differential in the morning.   Low threshold to repeat CT scan of abdomen and pelvis for worsening symptoms.   Continue antibiotics for now.   04/02/2018: Consult infectious disease for advice on antibiotics  Benign hypertension: Controlled. Continue to monitor closely.  Left bundle branch block:  Gastric esophageal reflux:  Stable.     DVT prophylaxis: SCD Code Status: Full code Family Communication:  Disposition Plan: Home eventually   Consultants:   None  Procedures:   None  Antimicrobials:   IV Zosyn   Subjective: Left lower abdominal pain is improving. No  nausea vomiting  Tolerating diet.  Will advance as tolerated.  Objective: Vitals:   04/01/18 2217 04/02/18 0528 04/02/18 0911 04/02/18 1307  BP: 138/64 (!) 120/52 134/61 139/62  Pulse: 64 (!) 58 (!) 57 (!) 58  Resp: 16 16  16   Temp: 98.7 F (37.1 C) 99 F (37.2 C)  (!) 97.5 F (36.4 C)  TempSrc: Oral Oral  Oral  SpO2: 96% 94%  99%  Weight:      Height:        Intake/Output Summary (Last 24 hours) at 04/02/2018 1405 Last data filed at 04/02/2018 1051 Gross per 24 hour  Intake 1011.09 ml  Output 1200 ml  Net -188.91 ml   Filed Weights   03/30/18 2105 03/30/18 2117  Weight: 54.4 kg 57.2 kg    Examination:  General exam: Appears calm and comfortable  Respiratory system: Clear to auscultation. Cardiovascular system: S1 & S2 Gastrointestinal system: Abdomen is soft, with vague left lower quadrant discomfort.  Organs are difficult to assess.  Central nervous system: Alert and oriented. No focal neurological deficits. Extremities: No leg edema   Data Reviewed: I have personally reviewed following labs and imaging studies  CBC: Recent Labs  Lab 03/31/18 0350 04/02/18 0520  WBC 9.5 8.9  NEUTROABS  --  7.5  HGB 9.7* 10.8*  HCT 31.2* 36.2  MCV 99.4 103.4*  PLT 232 700   Basic Metabolic Panel: Recent Labs  Lab 03/30/18 1604 03/31/18 0350 04/02/18 0520  NA 135 134* 138  K 4.6 4.0 4.1  CL 103 107 106  CO2 22 20* 17*  GLUCOSE 114* 94 92  BUN 35* 28* 21  CREATININE 1.19* 1.25* 1.02*  CALCIUM 9.3 8.1*  7.9*  MG  --  2.0  --   PHOS  --  3.2 1.4*   GFR: Estimated Creatinine Clearance: 28.2 mL/min (A) (by C-G formula based on SCr of 1.02 mg/dL (H)). Liver Function Tests: Recent Labs  Lab 03/30/18 1604 03/31/18 0350 04/02/18 0520  AST 26 20  --   ALT 24 19  --   ALKPHOS 16* 14*  --   BILITOT 1.1 1.2  --   PROT 7.1 5.6*  --   ALBUMIN 4.3 3.4* 3.2*   Recent Labs  Lab 03/30/18 1604  LIPASE 35   No results for input(s): AMMONIA in the last 168  hours. Coagulation Profile: No results for input(s): INR, PROTIME in the last 168 hours. Cardiac Enzymes: No results for input(s): CKTOTAL, CKMB, CKMBINDEX, TROPONINI in the last 168 hours. BNP (last 3 results) No results for input(s): PROBNP in the last 8760 hours. HbA1C: No results for input(s): HGBA1C in the last 72 hours. CBG: No results for input(s): GLUCAP in the last 168 hours. Lipid Profile: No results for input(s): CHOL, HDL, LDLCALC, TRIG, CHOLHDL, LDLDIRECT in the last 72 hours. Thyroid Function Tests: Recent Labs    03/31/18 0350  TSH 2.251   Anemia Panel: No results for input(s): VITAMINB12, FOLATE, FERRITIN, TIBC, IRON, RETICCTPCT in the last 72 hours. Urine analysis: No results found for: COLORURINE, APPEARANCEUR, LABSPEC, PHURINE, GLUCOSEU, HGBUR, BILIRUBINUR, KETONESUR, PROTEINUR, UROBILINOGEN, NITRITE, LEUKOCYTESUR Sepsis Labs: @LABRCNTIP (procalcitonin:4,lacticidven:4)  )No results found for this or any previous visit (from the past 240 hour(s)).       Radiology Studies: No results found.      Scheduled Meds: . losartan  100 mg Oral Daily   Continuous Infusions: . piperacillin-tazobactam (ZOSYN)  IV 3.375 g (04/02/18 0910)     LOS: 2 days    Time spent: 25 minutes    Dana Allan, MD  [facility] Pager #: [phone] 7PM-7AM contact night coverage as above

## 2018-04-03 LAB — RENAL FUNCTION PANEL
Albumin: 3.1 g/dL — ABNORMAL LOW (ref 3.5–5.0)
Anion gap: 16 — ABNORMAL HIGH (ref 5–15)
BUN: 17 mg/dL (ref 8–23)
CO2: 18 mmol/L — ABNORMAL LOW (ref 22–32)
Calcium: 7.9 mg/dL — ABNORMAL LOW (ref 8.9–10.3)
Chloride: 103 mmol/L (ref 98–111)
Creatinine, Ser: 1.01 mg/dL — ABNORMAL HIGH (ref 0.44–1.00)
GFR calc Af Amer: 57 mL/min — ABNORMAL LOW (ref >60)
GFR calc non Af Amer: 49 mL/min — ABNORMAL LOW (ref >60)
Glucose, Bld: 84 mg/dL (ref 70–99)
Phosphorus: 1.2 mg/dL — ABNORMAL LOW (ref 2.5–4.6)
Potassium: 3.9 mmol/L (ref 3.5–5.1)
Sodium: 137 mmol/L (ref 135–145)

## 2018-04-03 MED ORDER — HYDROCODONE-ACETAMINOPHEN 5-325 MG PO TABS: 1.0000 | ORAL_TABLET | Freq: Four times a day (QID) | ORAL | 0 refills | Status: AC | PRN

## 2018-04-03 MED ORDER — AMOXICILLIN-POT CLAVULANATE 875-125 MG PO TABS: 1.0000 | ORAL_TABLET | Freq: Two times a day (BID) | ORAL | Status: DC

## 2018-04-03 MED ORDER — AMOXICILLIN-POT CLAVULANATE 500-125 MG PO TABS: 1.0000 | ORAL_TABLET | Freq: Two times a day (BID) | ORAL | 0 refills | Status: AC

## 2018-04-03 MED ORDER — AMOXICILLIN-POT CLAVULANATE 875-125 MG PO TABS: 1.0000 | ORAL_TABLET | Freq: Two times a day (BID) | ORAL | 0 refills | Status: DC

## 2018-04-03 MED ORDER — AMOXICILLIN-POT CLAVULANATE 500-125 MG PO TABS
1.0000 | ORAL_TABLET | Freq: Two times a day (BID) | ORAL | Status: DC
  Filled 2018-04-03: qty 1

## 2018-04-03 NOTE — Progress Notes (Signed)
PHARMACY NOTE:  ANTIMICROBIAL RENAL DOSAGE ADJUSTMENT  Current antimicrobial regimen includes a mismatch between antimicrobial dosage and estimated renal function.  As per policy approved by the Pharmacy & Therapeutics and Medical Executive Committees, the antimicrobial dosage will be adjusted accordingly.  Current antimicrobial dosage:  Augmentin 875mg  PO BID  Indication: Diverticulitis with microperforation  Renal Function:  Estimated Creatinine Clearance: 28.5 mL/min (A) (by C-G formula based on SCr of 1.01 mg/dL (H)). []      On intermittent HD, scheduled: []      On CRRT    Antimicrobial dosage has been changed to:  Augmentin 500mg  PO BID for CrCl<61ml/min  Additional comments:   Thank you for allowing pharmacy to be a part of this patient's care.  Biagio Borg, [facility] 04/03/2018 2:28 PM

## 2018-04-03 NOTE — Progress Notes (Signed)
Discharge instructions given to pt and all questions were answered. Pt taken down via wheelchair and was picked up by her son.  

## 2018-04-03 NOTE — Discharge Summary (Addendum)
Physician Discharge Summary  Patient ID: [name] MRN: [id number] DOB/AGE: Nov 21, 1925 85 y.o.  Admit date: 03/30/2018 Discharge date: 04/03/2018  Admission Diagnoses: Principal Problem:   Diverticulitis of intestine with perforation without abscess Active Problems:   Benign essential HTN   GERD (gastroesophageal reflux disease)   LBBB (left bundle branch block)  Discharge Diagnoses:  Principal Problem:   Diverticulitis of intestine with perforation without abscess Active Problems:   Benign essential HTN   GERD (gastroesophageal reflux disease)   LBBB (left bundle branch block)   Discharged Condition: good  Presentation Summary: Susan Marks is a 85 y.o. female with medical history significant of LBBB, hypertension, hyperlipidemia, primary esophageal dysphagia who presented with abdominal pain came to [facility] clinic today was noted to have elevated white blood cell count up to 15 and fever 100.0 reports constipation last BM was 2 days ago. She took a a bowel softener but no help.  otherwise no nausea only dry heaving no urinary complaints. Patient endorsing some nausea vomiting left lower quadrant abdominal pain started around 2 AM when she woke up with some nausea.  Similar to prior attacks of diverticulitis she has not had any fevers or chills. Regarding Chronic problems :  nuclear stress test on 03/19/14 and everything was fine. History of hypertension for which she takes Cozaar.   Had swallow evaluation done in 2015 that showed primary esophageal dysphasia treatment recommended at this time regular thin diet. Last colonoscopy was 8 years ago.  ER Course: CXR was ordered but patient prefers to wait and do it in the morning     Hospital Course:  # Diverticulitis with microperforation: but no abscess by CT.  Patient was treated w/ IV zosyn and made slow continual progress and by 1/15 today is ready for dc home.  Eating solid foods, no N/V, some loose stools, abd pain almost  gone last pain med 24 hrs ago.   - seen by ID consultation Dr Baxter Flattery, appreciate rec's - will transition to Augmentin po 500/125 mg bid (for renal failure) x 6 days at dc today - Rx for pain meds Vicodin #12  # Benign hypertension: Controlled. Cont losartan  # Left bundle branch block  # Gastric esophageal reflux Stable.       Consultants:  ID Dr Baxter Flattery  Procedures:   None  Antimicrobials:   IV Zosyn   Discharge Exam: Blood pressure (!) 147/59, pulse (!) 54, temperature 98.2 F (36.8 C), temperature source Oral, resp. rate 16, height 4\' 11"  (1.499 m), weight 57.2 kg, SpO2 100 %. General exam: Appears calm and comfortable  Respiratory system: Clear to auscultation. Cardiovascular system: S1 & S2 Gastrointestinal system: Abdomen is soft, with vague left lower quadrant discomfort.  Organs are difficult to assess.  Central nervous system: Alert and oriented. No focal neurological deficits. Extremities: No leg edema  Disposition: Discharge disposition: 01-Home or Self Care        Allergies as of 04/03/2018      Reactions   Sulfa Antibiotics Other (See Comments)   REACTION: swollen under eyes and cheek   Lisinopril    cough   Other Swelling   Preservatives in eye drops   Statins    Leg cramps      Medication List    STOP taking these medications   Azelastine-Fluticasone 137-50 MCG/ACT Susp Commonly known as:  DYMISTA   fluticasone furoate-vilanterol 100-25 MCG/INH Aepb Commonly known as:  BREO ELLIPTA     TAKE these medications  acetaminophen 500 MG tablet Commonly known as:  TYLENOL Take 1,000 mg by mouth every 6 (six) hours as needed for mild pain.   amoxicillin-clavulanate 500-125 MG tablet Commonly known as:  AUGMENTIN Take 1 tablet (500 mg total) by mouth 2 (two) times daily for 6 days.   aspirin 81 MG tablet Take 81 mg by mouth daily.   cholecalciferol 10 MCG (400 UNIT) Tabs tablet Commonly known as:  VITAMIN D3 Take 400  Units by mouth.   cyanocobalamin 2000 MCG tablet Take 2,000 mcg by mouth daily.   denosumab 60 MG/ML Soln injection Commonly known as:  PROLIA Inject 60 mg into the skin every 6 (six) months. Administer in upper arm, thigh, or abdomen   famotidine 20 MG tablet Commonly known as:  PEPCID Take 20 mg by mouth at bedtime.   fenofibrate micronized 200 MG capsule Commonly known as:  LOFIBRA Take 200 mg by mouth at bedtime.   HYDROcodone-acetaminophen 5-325 MG tablet Commonly known as:  NORCO/VICODIN Take 1-2 tablets by mouth every 6 (six) hours as needed for up to 7 days for moderate pain.   ibuprofen 200 MG tablet Commonly known as:  ADVIL,MOTRIN Take 400 mg by mouth as needed for moderate pain.   losartan 100 MG tablet Commonly known as:  COZAAR TAKE 1 TABLET BY MOUTH EVERY DAY   polyethylene glycol packet Commonly known as:  MIRALAX / GLYCOLAX Take 17 g by mouth daily as needed for mild constipation.   VIACTIV PO Take 2 capsules by mouth daily.        Signed: Sandy Salaam Taaliyah Delpriore 04/03/2018, 2:41 PM

## 2018-04-03 NOTE — Care Management Important Message (Signed)
Important Message  Patient Details  Name: Susan Marks MRN: [id number] Date of Birth: 1925-09-30   Medicare Important Message Given:  Yes    Kerin Salen 04/03/2018, 10:23 AMImportant Message  Patient Details  Name: Susan Marks MRN: [id number] Date of Birth: [birth year]   Medicare Important Message Given:  Yes    Kerin Salen 04/03/2018, 10:23 AM

## 2018-04-05 DIAGNOSIS — K5792 Diverticulitis of intestine, part unspecified, without perforation or abscess without bleeding: Secondary | ICD-10-CM | POA: Diagnosis not present

## 2018-04-09 ENCOUNTER — Other Ambulatory Visit: Payer: Self-pay

## 2018-04-09 NOTE — Patient Outreach (Signed)
[facility]) Care Management  04/09/2018  Leighann Amadon Hegeman [birth year] [id number]  EMMI: general discharge red alert Referral date: 04/09/18 Referral reason:  Sad/ hopeless/ anxious/ empty,  Insurance: Health team advantage Day # 4 Attempt 1  Telephone call to patient regarding EMMI general discharge red alert. Unable to reach patient. Contact answering phone states patient is not feeling well and is asleep at this time. HIPAA compliant message left with call back phone number.    PLAN: RNCM will attempt 2nd telephone call to patient within 4 business days.  RNCM will send  Outreach letter to attempt contact   [name] RN,BSN,CCM [facility] Telephonic  [phone]

## 2018-04-10 ENCOUNTER — Ambulatory Visit: Payer: Self-pay

## 2018-04-24 DIAGNOSIS — K5900 Constipation, unspecified: Secondary | ICD-10-CM | POA: Diagnosis not present

## 2018-04-24 DIAGNOSIS — K5720 Diverticulitis of large intestine with perforation and abscess without bleeding: Secondary | ICD-10-CM | POA: Diagnosis not present

## 2018-05-01 ENCOUNTER — Other Ambulatory Visit: Payer: Self-pay

## 2018-05-01 ENCOUNTER — Inpatient Hospital Stay: Payer: PPO | Admitting: Family

## 2018-05-01 NOTE — Patient Outreach (Signed)
[name] Care Management  05/01/2018  Susan Marks [birth year] [id number]  EMMI: general discharge red alert Referral date: 04/09/18 Referral reason:  Sad/ hopeless/ anxious/ empty,  Insurance: Health team advantage Day # 4 Attempt #2   Telephone call to patient regarding EMMI general discharge red alert. Unable to reach patient. HIPAa compliant voice message left with call back phone number.   PLAN; RNCM will attempt 3rd telephone call to patient within 4 business days.   [name] RN,BSN,CCM [facility] Telephonic  [phone]

## 2018-05-02 ENCOUNTER — Other Ambulatory Visit: Payer: Self-pay

## 2018-05-02 NOTE — Patient Outreach (Signed)
[facility]) Care Management  05/02/2018  Susanna Benge [place] 1925/04/14 [id number]  EMMI:general discharge red alert Referral date:04/09/18 Referral reason:Sad/ hopeless/ anxious/ empty, Insurance: Health team advantage Day #4  Third telephone call to patient regarding EMMI general discharge red alert. Unable to reach patient  HIPAA compliant message left with call back phone number.   PLAN:  If no return call will proceed with closure.   [name] RN,BSN,CCM [facility] Telephonic  [phone]

## 2018-05-10 DIAGNOSIS — K219 Gastro-esophageal reflux disease without esophagitis: Secondary | ICD-10-CM | POA: Diagnosis not present

## 2018-05-10 DIAGNOSIS — K5790 Diverticulosis of intestine, part unspecified, without perforation or abscess without bleeding: Secondary | ICD-10-CM | POA: Diagnosis not present

## 2018-05-10 DIAGNOSIS — E039 Hypothyroidism, unspecified: Secondary | ICD-10-CM | POA: Diagnosis not present

## 2018-06-10 ENCOUNTER — Other Ambulatory Visit: Payer: Self-pay

## 2018-06-10 NOTE — Patient Outreach (Signed)
[facility]) Care Management  06/10/2018  Susan Marks 01/27/1926 [id number]  EMMI:general discharge red alert Referral date:04/09/18 Referral reason:Sad/ hopeless/ anxious/ empty, Insurance: Health team advantage Day #4  Case Closure:  No response after 3 telephone calls and outreach letter attempt.  PLAN: RNCM will close patient due to being unable to reach.  RNCM will send closure notification to patient's primary MD   [name] RN,BSN,CCM [facility] Telephonic  [phone]

## 2018-08-30 DIAGNOSIS — L0292 Furuncle, unspecified: Secondary | ICD-10-CM | POA: Diagnosis not present

## 2018-08-30 DIAGNOSIS — D649 Anemia, unspecified: Secondary | ICD-10-CM | POA: Diagnosis not present

## 2018-08-30 DIAGNOSIS — R7301 Impaired fasting glucose: Secondary | ICD-10-CM | POA: Diagnosis not present

## 2018-10-16 DIAGNOSIS — M48061 Spinal stenosis, lumbar region without neurogenic claudication: Secondary | ICD-10-CM | POA: Diagnosis not present

## 2018-10-28 ENCOUNTER — Ambulatory Visit: Payer: PPO | Attending: Chiropractic Medicine | Admitting: Physical Therapy

## 2018-10-28 ENCOUNTER — Other Ambulatory Visit: Payer: Self-pay

## 2018-10-28 ENCOUNTER — Encounter: Payer: Self-pay | Admitting: Physical Therapy

## 2018-10-28 DIAGNOSIS — R262 Difficulty in walking, not elsewhere classified: Secondary | ICD-10-CM | POA: Diagnosis not present

## 2018-10-28 DIAGNOSIS — M6281 Muscle weakness (generalized): Secondary | ICD-10-CM | POA: Insufficient documentation

## 2018-10-28 DIAGNOSIS — R2681 Unsteadiness on feet: Secondary | ICD-10-CM | POA: Insufficient documentation

## 2018-10-28 DIAGNOSIS — G8929 Other chronic pain: Secondary | ICD-10-CM | POA: Insufficient documentation

## 2018-10-28 DIAGNOSIS — M25511 Pain in right shoulder: Secondary | ICD-10-CM | POA: Insufficient documentation

## 2018-10-28 DIAGNOSIS — M5441 Lumbago with sciatica, right side: Secondary | ICD-10-CM | POA: Insufficient documentation

## 2018-10-28 DIAGNOSIS — R293 Abnormal posture: Secondary | ICD-10-CM | POA: Insufficient documentation

## 2018-10-28 DIAGNOSIS — M25551 Pain in right hip: Secondary | ICD-10-CM | POA: Insufficient documentation

## 2018-10-28 NOTE — Patient Instructions (Signed)
Access Code: [email or web address]  URL: [email or web address]  Date: 10/28/2018  Prepared by: Venetia Night Jibran Crookshanks   Exercises  Supine Posterior Pelvic Tilt - 10 reps - 2 sets - 5 hold - 1x daily - 7x weekly  Supine Piriformis Stretch with Foot on Ground - 3 reps - 2 sets - 30 hold - 1x daily - 7x weekly  Hooklying Single Knee to Chest Stretch - 10 reps - 2 sets - 5 hold - 1x daily - 7x weekly

## 2018-10-28 NOTE — Therapy (Signed)
[facility] [address], [place], [place], [address] Phone: [phone]   Fax:  [phone]  Physical Therapy Evaluation  Patient Details  Name: Susan Marks MRN: [id number] Date of Birth: 1925/05/02 Referring Provider (PT): Levy Pupa, [place]   Encounter Date: 10/28/2018  PT End of Session - 10/28/18 1426    Visit Number  1    Date for PT Re-Evaluation  12/23/18    Authorization Type  [employer]    PT Start Time  1330    PT Stop Time  1420    PT Time Calculation (min)  50 min    Activity Tolerance  Patient tolerated treatment well;Patient limited by pain    Behavior During Therapy  [facility] for tasks assessed/performed       Past Medical History:  Diagnosis Date  . Chronic headaches    migraines  . Chronic kidney disease   . High blood pressure   . High cholesterol     Past Surgical History:  Procedure Laterality Date  . VESICOVAGINAL FISTULA CLOSURE W/ TAH      There were no vitals filed for this visit.   Subjective Assessment - 10/28/18 1335    Subjective  Pt has had multiple episodes of acute low back pain and Rt hip pain since she was in her 9s.  Pt also has scoliosis and has lost 4 inches of height.  Injections done approx 2 years ago haven't helped.  Prednisone packs have helped somewhat.    Pertinent History  scoliosis, osteoporosis    Limitations  House hold activities    How long can you sit comfortably?  can't tell    How long can you stand comfortably?  30    Patient Stated Goals  reduce pain, learn how to do household tasks with less pain (laundry - front loading)    Currently in Pain?  Yes    Pain Score  9     Pain Location  Back    Pain Orientation  Right    Pain Descriptors / Indicators  Aching;Tiring;Radiating;Squeezing    Pain Type  Chronic pain    Pain Radiating Towards  Rt buttock, Rt hip, Rt anterior thigh, medial knee, anterior shin    Pain Onset  More than a  month ago    Aggravating Factors   bending, carrying, laundry etc    Pain Relieving Factors  laying down, ice    Effect of Pain on Daily Activities  household activities         [facility] PT Assessment - 10/28/18 0001      Assessment   Medical Diagnosis  M48.061 (ICD-10-CM) - Spinal stenosis, lumbar region without neurogenic claudication    Referring Provider (PT)  Levy Pupa, PA-C    Hand Dominance  Right    Next MD Visit  no    Prior Therapy  yes      Precautions   Precautions  Other (comment);Fall    Precaution Comments  osteoporosis, had a near fall, carries cane now      Restrictions   Weight Bearing Restrictions  No      Balance Screen   Has the patient fallen in the past 6 months  No    Has the patient had a decrease in activity level because of a fear of falling?   No    Is the patient reluctant to leave their home because of a fear of falling?   No  Home Environment   Living Environment  Private residence    Living Arrangements  Spouse/significant other    Type of [place] to enter    Entrance Stairs-Number of Steps  4    Entrance Stairs-Rails  Can reach both    [place]  One level    [place] - single point    Additional Comments  cane with tri-point base      Prior Function   Level of Independence  Independent    Vocation  Retired    Leisure  volunteering      Cognition   Overall Cognitive Status  Within Functional Limits for tasks assessed      Observation/Other Assessments   Scoliosis  yes    Focus on Therapeutic Outcomes (FOTO)   66   54 goal     Sensation   Light Touch  Appears Intact      Functional Tests   Functional tests  Sit to Stand      Sit to Stand   Comments  uses bil UEs but can do it without when asked      Posture/Postural Control   Posture/Postural Control  Postural limitations    Postural Limitations  Decreased lumbar lordosis;Increased thoracic kyphosis;Flexed trunk;Left pelvic  obliquity      ROM / Strength   AROM / PROM / Strength  AROM;Strength      AROM   Overall AROM Comments  bil hip ER 20 deg, trunk ROM limited all planes by 60%, pain with trunk flexion, rigid scoliosis limits motion      Strength   Overall Strength Comments  4-/5 bil hip flexors, bil knee flexion, 3+/5 bil hip abduction and extension      Flexibility   Soft Tissue Assessment /Muscle Length  yes   hip flexors limited 50%   Hamstrings  limited bil by 20%    Quadriceps  limited bil by 30%    Piriformis  limited 50% bil      Palpation   Spinal mobility  poor mobility throughout, rigid scoliosis present    SI assessment   poor Rt SI mobility    Palpation comment  Rt SI joint line, Rt glute med, Rt glute min, Rt greater trochanter      Bed Mobility   Bed Mobility  --   needs cueing for proper safe transfers onto mat table     Transfers   Five time sit to stand comments   18 sec with use of bil UEs      Ambulation/Gait   Assistive device  Straight cane    Gait Pattern  Step-through pattern    Gait Comments  slow, flexed posture                Objective measurements completed on examination: See above findings.              PT Education - 10/28/18 1413    Education Details  Access Code: [email or web address]    Person(s) Educated  Patient    Methods  Explanation;Demonstration;Handout    Comprehension  Verbalized understanding;Returned demonstration       PT Short Term Goals - 10/28/18 1901      PT SHORT TERM GOAL #1   Title  Pt will be ind in initial HEP    Time  4    Period  Weeks    Status  New    Target Date  11/25/18      PT SHORT TERM GOAL #2   Title  Pt will report pain reduction of Rt lower quadrant and LE by at least 20%    Time  4    Period  Weeks    Status  New    Target Date  11/25/18        PT Long Term Goals - 10/28/18 1902      PT LONG TERM GOAL #1   Title  Pt will be ind in advanced HEP and report compliance at least three days a  week.    Time  8    Period  Weeks    Status  New    Target Date  12/23/18      PT LONG TERM GOAL #2   Title  Pt will report at least 50% reduction in pain with daily activities around the house such as laundry and light housework.    Time  8    Period  Weeks    Status  New    Target Date  12/23/18      PT LONG TERM GOAL #3   Title  Pt will improve 5x sit to stand to </= 15 sec with proper body mechanics and without need of UEs    Time  8    Period  Weeks    Status  New    Target Date  12/23/18      PT LONG TERM GOAL #4   Title  Pt will report improved stability evident by her report of no LOB from the start of therapy to decrease her risk of falling    Time  8    Period  Weeks    Status  New    Target Date  12/23/18      PT LONG TERM GOAL #5   Title  Pt will achieve at least 4/5 strength rating of LE muscle groups and trunk to improve performance of daily tasks such as laundry and stairs to enter house.    Time  8    Period  Weeks    Status  New    Target Date  12/23/18             Plan - 10/28/18 1427    Clinical Impression Statement  Pt is a pleasant 85yo female with chronic history of Rt sided low back and hip/buttock pain.  Pain now extends into Rt groin, anterior thigh, medial knee and calf.  She has degenerative scoliosis, osteoporosis and now walks with a cane after two near-falls at home.  PT has helped her pain in the past.  She has limited spinal and hip mobility, limited flexibility in LEs, impaired balance in SLS, and decreased strength in bil LEs.  She will benefit from skilled PT to address these deficits as well as body mechanics education and training for safety and pain management during household tasks.    Personal Factors and Comorbidities  Age;Comorbidity 1;Comorbidity 2;Time since onset of injury/illness/exacerbation;Comorbidity 3+    Comorbidities  osteoporosis, scoliosis, fall risk    Examination-Activity Limitations   Bend;Sleep;Carry;Squat;Stand;Locomotion Level;Lift    Examination-Participation Restrictions  Laundry;Volunteer;Driving;Community Activity    Stability/Clinical Decision Making  Evolving/Moderate complexity    Clinical Decision Making  Moderate    Rehab Potential  Good    PT Frequency  2x / week    PT Duration  8 weeks    PT Treatment/Interventions  ADLs/Self Care Home Management;Aquatic Therapy;Cryotherapy;Electrical Stimulation;Iontophoresis 4mg /ml Dexamethasone;Moist Heat;Traction;Gait training;Stair  training;Functional mobility training;Therapeutic activities;Therapeutic exercise;Balance training;Neuromuscular re-education;Patient/family education;Manual techniques;Passive range of motion;Dry needling;Spinal Manipulations;Joint Manipulations    PT Next Visit Plan  body mechanics training for laundry, add hip flexor stretch on stool, manual therapy to Rt SI/hip/lumbar for gapping, update HEP    PT Home Exercise Plan  Access Code: [email or web address]    Consulted and Agree with Plan of Care  Patient       Patient will benefit from skilled therapeutic intervention in order to improve the following deficits and impairments:  Abnormal gait, Decreased range of motion, Difficulty walking, Increased fascial restricitons, Decreased endurance, Increased muscle spasms, Decreased activity tolerance, Pain, Decreased balance, Impaired flexibility, Improper body mechanics, Decreased mobility, Decreased strength, Postural dysfunction  Visit Diagnosis: 1. Chronic right-sided low back pain with right-sided sciatica   2. Pain in right hip   3. Muscle weakness (generalized)   4. Abnormal posture        Problem List Patient Active Problem List   Diagnosis Date Noted  . LBBB (left bundle branch block) 03/30/2018  . Diverticulitis of intestine with perforation without abscess 03/30/2018  . Chest pain 03/10/2014  . Benign essential HTN 03/10/2014  . Abnormal EKG 03/10/2014  . GERD (gastroesophageal reflux  disease) 07/07/2013  . Granulomatous lung disease ([place]) 07/07/2013  . Cough 06/13/2013    Johanna Beuhring, PT 10/28/18 7:10 PM   [facility] [address] [place], [place], [address] Phone: [phone]   Fax:  [phone]  Name: Susan Marks MRN: [id number] Date of Birth: 04-04-1925

## 2018-10-30 ENCOUNTER — Other Ambulatory Visit: Payer: Self-pay

## 2018-10-30 ENCOUNTER — Encounter: Payer: Self-pay | Admitting: Physical Therapy

## 2018-10-30 ENCOUNTER — Ambulatory Visit: Payer: PPO | Admitting: Physical Therapy

## 2018-10-30 DIAGNOSIS — R293 Abnormal posture: Secondary | ICD-10-CM

## 2018-10-30 DIAGNOSIS — M25551 Pain in right hip: Secondary | ICD-10-CM

## 2018-10-30 DIAGNOSIS — G8929 Other chronic pain: Secondary | ICD-10-CM

## 2018-10-30 DIAGNOSIS — M6281 Muscle weakness (generalized): Secondary | ICD-10-CM

## 2018-10-30 DIAGNOSIS — M5441 Lumbago with sciatica, right side: Secondary | ICD-10-CM | POA: Diagnosis not present

## 2018-10-30 NOTE — Patient Instructions (Signed)
Access Code: [email or web address]  URL: [email or web address]  Date: 10/30/2018  Prepared by: Venetia Night Inara Dike   Exercises  Supine Posterior Pelvic Tilt - 10 reps - 2 sets - 5 hold - 1x daily - 7x weekly  Supine Piriformis Stretch - 10 reps - 3 sets - 30 hold - 1x daily - 7x weekly  Hooklying Single Knee to Chest Stretch - 10 reps - 2 sets - 5 hold - 1x daily - 7x weekly  Bridge - 10 reps - 2 sets - 1x daily - 7x weekly  Hooklying Isometric Hip Flexion - 10 reps - 2 sets - 5 hold - 1x daily - 7x weekly  Supine Isometric Shoulder Extension with Towel - 10 reps - 2 sets - 5 hold - 1x daily - 7x weekly  Hip Flexor Stretch on Step - 10 reps - 1 sets - 5 hold - 1x daily - 7x weekly

## 2018-10-30 NOTE — Therapy (Signed)
[facility] [address] [place], [place], [address] Phone: [phone]   Fax:  [phone]  Physical Therapy Treatment  Patient Details  Name: Susan Marks MRN: [id number] Date of Birth: 02-14-1926 Referring Provider (PT): Levy Pupa, [place]   Encounter Date: 10/30/2018  PT End of Session - 10/30/18 1409    Visit Number  2    Date for PT Re-Evaluation  12/23/18    Authorization Type  [employer]    PT Start Time  1330    PT Stop Time  1410    PT Time Calculation (min)  40 min    Activity Tolerance  Patient tolerated treatment well;Patient limited by pain    Behavior During Therapy  [facility] for tasks assessed/performed       Past Medical History:  Diagnosis Date  . Chronic headaches    migraines  . Chronic kidney disease   . High blood pressure   . High cholesterol     Past Surgical History:  Procedure Laterality Date  . VESICOVAGINAL FISTULA CLOSURE W/ TAH      There were no vitals filed for this visit.  Subjective Assessment - 10/30/18 1331    Subjective  I am not sure I am doing HEP correctly.  No more near falls since last here.  I am afraid of the NuStep.    Pertinent History  scoliosis, osteoporosis    Limitations  House hold activities    How long can you sit comfortably?  can't tell    How long can you stand comfortably?  30    Patient Stated Goals  reduce pain, learn how to do household tasks with less pain (laundry - front loading)    Currently in Pain?  Yes    Pain Score  3     Pain Location  Back    Pain Orientation  Right    Pain Descriptors / Indicators  Aching;Tiring    Pain Type  Chronic pain    Pain Onset  More than a month ago    Effect of Pain on Daily Activities  household tasks                       [facility] Adult PT Treatment/Exercise - 10/30/18 0001      Exercises   Exercises  Lumbar;Knee/Hip      Lumbar Exercises: Stretches   Single Knee to  Chest Stretch  5 reps;Left;Right    Hip Flexor Stretch  Left;Right;2 reps;20 seconds    Hip Flexor Stretch Limitations  foot on second step with bil handhold      Lumbar Exercises: Supine   Pelvic Tilt  10 reps    Bridge  10 reps;1 second    Bridge Limitations  PT cued glute squeeze    Isometric Hip Flexion  10 reps;5 seconds    Other Supine Lumbar Exercises  whole arm press into table      Knee/Hip Exercises: Seated   Clamshell with TheraBand  Red   15 reps   Marching  Both;20 reps    Marching Limitations  red band around knees      Manual Therapy   Manual Therapy  Soft tissue mobilization    Soft tissue mobilization  Rt gluteals, quads, SI region, lumbar             PT Education - 10/30/18 1409    Education Details  Access Code: [email or web address]    Person(s) Educated  Patient    Methods  Explanation;Demonstration;Verbal cues;Handout    Comprehension  Verbalized understanding;Returned demonstration       PT Short Term Goals - 10/30/18 1417      PT SHORT TERM GOAL #1   Title  Pt will be ind in initial HEP    Status  On-going        PT Long Term Goals - 10/28/18 1902      PT LONG TERM GOAL #1   Title  Pt will be ind in advanced HEP and report compliance at least three days a week.    Time  8    Period  Weeks    Status  New    Target Date  12/23/18      PT LONG TERM GOAL #2   Title  Pt will report at least 50% reduction in pain with daily activities around the house such as laundry and light housework.    Time  8    Period  Weeks    Status  New    Target Date  12/23/18      PT LONG TERM GOAL #3   Title  Pt will improve 5x sit to stand to </= 15 sec with proper body mechanics and without need of UEs    Time  8    Period  Weeks    Status  New    Target Date  12/23/18      PT LONG TERM GOAL #4   Title  Pt will report improved stability evident by her report of no LOB from the start of therapy to decrease her risk of falling    Time  8    Period  Weeks     Status  New    Target Date  12/23/18      PT LONG TERM GOAL #5   Title  Pt will achieve at least 4/5 strength rating of LE muscle groups and trunk to improve performance of daily tasks such as laundry and stairs to enter house.    Time  8    Period  Weeks    Status  New    Target Date  12/23/18            Plan - 10/30/18 1414    Clinical Impression Statement  Pt with good tolerance of low level spinal ROM, stretches and beginning supine and seated strength today.  She requires frequent cueing and re-direction due to some memory trouble.  She reported no increase in pain with progression of HEP today.  She will likely need review of updated HEP next visit to ensure proper performance.  She will continue to benefit from skilled PT along current POC.    Comorbidities  osteoporosis, scoliosis, fall risk    Rehab Potential  Good    PT Frequency  2x / week    PT Duration  8 weeks    PT Treatment/Interventions  ADLs/Self Care Home Management;Aquatic Therapy;Cryotherapy;Electrical Stimulation;Iontophoresis 4mg /ml Dexamethasone;Moist Heat;Traction;Gait training;Stair training;Functional mobility training;Therapeutic activities;Therapeutic exercise;Balance training;Neuromuscular re-education;Patient/family education;Manual techniques;Passive range of motion;Dry needling;Spinal Manipulations;Joint Manipulations    PT Next Visit Plan  body mechanics training for laundry, review HEP, add seated strength for LEs    PT Home Exercise Plan  Access Code: [email or web address]    Consulted and Agree with Plan of Care  Patient       Patient will benefit from skilled therapeutic intervention in order to improve the following deficits and impairments:  Abnormal gait, Decreased range of motion,  Difficulty walking, Increased fascial restricitons, Decreased endurance, Increased muscle spasms, Decreased activity tolerance, Pain, Decreased balance, Impaired flexibility, Improper body mechanics, Decreased mobility, Decreased  strength, Postural dysfunction  Visit Diagnosis: 1. Chronic right-sided low back pain with right-sided sciatica   2. Pain in right hip   3. Muscle weakness (generalized)   4. Abnormal posture        Problem List Patient Active Problem List   Diagnosis Date Noted  . LBBB (left bundle branch block) 03/30/2018  . Diverticulitis of intestine with perforation without abscess 03/30/2018  . Chest pain 03/10/2014  . Benign essential HTN 03/10/2014  . Abnormal EKG 03/10/2014  . GERD (gastroesophageal reflux disease) 07/07/2013  . Granulomatous lung disease ([place]) 07/07/2013  . Cough 06/13/2013    Etter Royall, PT 10/30/18 2:18 PM   [facility] [address] [place], [place], [address] Phone: [phone]   Fax:  [phone]  Name: Susan Marks MRN: [id number] Date of Birth: 01-May-1925

## 2018-11-04 ENCOUNTER — Encounter: Payer: PPO | Admitting: Physical Therapy

## 2018-11-08 ENCOUNTER — Encounter: Payer: Self-pay | Admitting: Physical Therapy

## 2018-11-08 ENCOUNTER — Other Ambulatory Visit: Payer: Self-pay

## 2018-11-08 ENCOUNTER — Ambulatory Visit: Payer: PPO | Admitting: Physical Therapy

## 2018-11-08 DIAGNOSIS — M5441 Lumbago with sciatica, right side: Secondary | ICD-10-CM

## 2018-11-08 DIAGNOSIS — R293 Abnormal posture: Secondary | ICD-10-CM

## 2018-11-08 DIAGNOSIS — M25551 Pain in right hip: Secondary | ICD-10-CM

## 2018-11-08 DIAGNOSIS — G8929 Other chronic pain: Secondary | ICD-10-CM

## 2018-11-08 DIAGNOSIS — M6281 Muscle weakness (generalized): Secondary | ICD-10-CM

## 2018-11-08 NOTE — Therapy (Signed)
[facility] [address], [place], [place], [address] Phone: [phone]   Fax:  [phone]  Physical Therapy Treatment  Patient Details  Name: Susan Marks MRN: [id number] Date of Birth: 02-27-1926 Referring Provider (PT): Levy Pupa, [place]   Encounter Date: 11/08/2018  PT End of Session - 11/08/18 1205    Visit Number  3    Date for PT Re-Evaluation  12/23/18    Authorization Type  [employer]    PT Start Time  1115    PT Stop Time  1200    PT Time Calculation (min)  45 min    Activity Tolerance  Patient tolerated treatment well;Patient limited by pain    Behavior During Therapy  [facility] for tasks assessed/performed       Past Medical History:  Diagnosis Date  . Chronic headaches    migraines  . Chronic kidney disease   . High blood pressure   . High cholesterol     Past Surgical History:  Procedure Laterality Date  . VESICOVAGINAL FISTULA CLOSURE W/ TAH      There were no vitals filed for this visit.  Subjective Assessment - 11/08/18 1122    Subjective  the ice helps my hip pain but heat feels good on my back.  I have back pain today and it worries me.    Pertinent History  scoliosis, osteoporosis    How long can you sit comfortably?  can't tell    How long can you stand comfortably?  30    Patient Stated Goals  reduce pain, learn how to do household tasks with less pain (laundry - front loading)    Currently in Pain?  Yes    Pain Score  6     Pain Location  Back    Pain Orientation  Right;Mid    Pain Descriptors / Indicators  Aching    Pain Type  Chronic pain    Pain Radiating Towards  Rt buttock    Pain Onset  Other (comment)    Pain Frequency  Intermittent    Aggravating Factors   bending, carrying, laundry    Pain Relieving Factors  ice (Rt hip), heat (lumbar), laying down                       [facility] Adult PT Treatment/Exercise - 11/08/18 0001      Self-Care   Self-Care  Other Self-Care Comments;Heat/Ice Application    Heat/Ice Application  use of heat for low back, ice as needed for hip    Other Self-Care Comments   use of HEP as tools for pain management, laundry body mechanics, TrA use during household tasks      Exercises   Exercises  Lumbar;Knee/Hip      Lumbar Exercises: Stretches   Single Knee to Chest Stretch  5 reps;Left;Right;10 seconds    Piriformis Stretch  Left;Right;30 seconds;1 rep      Lumbar Exercises: Supine   Pelvic Tilt  10 reps    Clam  10 reps    Clam Limitations  red band, PT cued TrA    Bent Knee Raise  20 reps    Bent Knee Raise Limitations  PT cued TrA      Knee/Hip Exercises: Seated   Marching Limitations  attempted but Rt LBP with Lt LE march    Sit to Sand  5 reps;without UE support   Pt cued Pt to use LEs and TrA  and less momentum     Modalities   Modalities  Moist Heat      Moist Heat Therapy   Number Minutes Moist Heat  10 Minutes    Moist Heat Location  Lumbar Spine   concurrent with supine ther ex              PT Short Term Goals - 11/08/18 1208      PT SHORT TERM GOAL #1   Title  Pt will be ind in initial HEP    Status  On-going        PT Long Term Goals - 10/28/18 1902      PT LONG TERM GOAL #1   Title  Pt will be ind in advanced HEP and report compliance at least three days a week.    Time  8    Period  Weeks    Status  New    Target Date  12/23/18      PT LONG TERM GOAL #2   Title  Pt will report at least 50% reduction in pain with daily activities around the house such as laundry and light housework.    Time  8    Period  Weeks    Status  New    Target Date  12/23/18      PT LONG TERM GOAL #3   Title  Pt will improve 5x sit to stand to </= 15 sec with proper body mechanics and without need of UEs    Time  8    Period  Weeks    Status  New    Target Date  12/23/18      PT LONG TERM GOAL #4   Title  Pt will report improved stability evident by her  report of no LOB from the start of therapy to decrease her risk of falling    Time  8    Period  Weeks    Status  New    Target Date  12/23/18      PT LONG TERM GOAL #5   Title  Pt will achieve at least 4/5 strength rating of LE muscle groups and trunk to improve performance of daily tasks such as laundry and stairs to enter house.    Time  8    Period  Weeks    Status  New    Target Date  12/23/18            Plan - 11/08/18 1205    Clinical Impression Statement  Pt continues to report the need to take frequent breaks from household standing tasks to lay down for pain.  She continues to express fearfulness about pain and gives frequent feedback about "feeling it" during stretches and exercises although it is difficult for the PT to tell whether this is pain or appropriate stretching/muscle activation.  She reported pain relief today with use of heat and gentle ROM and stabilization exercise today.  PT introduced TrA contraction, emphasized use of this during household tasks, and reviewed body mechanics for laundry.  Pt will continue to benefit from skilled PT along current POC.    PT Frequency  2x / week    PT Duration  8 weeks    PT Treatment/Interventions  ADLs/Self Care Home Management;Aquatic Therapy;Cryotherapy;Electrical Stimulation;Iontophoresis 4mg /ml Dexamethasone;Moist Heat;Traction;Gait training;Stair training;Functional mobility training;Therapeutic activities;Therapeutic exercise;Balance training;Neuromuscular re-education;Patient/family education;Manual techniques;Passive range of motion;Dry needling;Spinal Manipulations;Joint Manipulations    PT Next Visit Plan  f/u on TrA and use of for household  tasks, moist heat low back, update HEP as tol for gentle stabilization    PT Home Exercise Plan  Access Code: [email or web address]    Consulted and Agree with Plan of Care  Patient       Patient will benefit from skilled therapeutic intervention in order to improve the following deficits  and impairments:     Visit Diagnosis: Chronic right-sided low back pain with right-sided sciatica  Pain in right hip  Muscle weakness (generalized)  Abnormal posture     Problem List Patient Active Problem List   Diagnosis Date Noted  . LBBB (left bundle branch block) 03/30/2018  . Diverticulitis of intestine with perforation without abscess 03/30/2018  . Chest pain 03/10/2014  . Benign essential HTN 03/10/2014  . Abnormal EKG 03/10/2014  . GERD (gastroesophageal reflux disease) 07/07/2013  . Granulomatous lung disease ([place]) 07/07/2013  . Cough 06/13/2013    Zaylee Cornia, PT 11/08/18 12:11 PM   [facility] [address], [place], [place], [address] Phone: [phone]   Fax:  [phone]  Name: Susan Marks MRN: [id number] Date of Birth: 1926/01/06

## 2018-11-11 ENCOUNTER — Encounter: Payer: Self-pay | Admitting: Physical Therapy

## 2018-11-11 ENCOUNTER — Other Ambulatory Visit: Payer: Self-pay

## 2018-11-11 ENCOUNTER — Ambulatory Visit: Payer: PPO | Admitting: Physical Therapy

## 2018-11-11 DIAGNOSIS — M5441 Lumbago with sciatica, right side: Secondary | ICD-10-CM | POA: Diagnosis not present

## 2018-11-11 DIAGNOSIS — M25551 Pain in right hip: Secondary | ICD-10-CM

## 2018-11-11 DIAGNOSIS — G8929 Other chronic pain: Secondary | ICD-10-CM

## 2018-11-11 DIAGNOSIS — R293 Abnormal posture: Secondary | ICD-10-CM

## 2018-11-11 DIAGNOSIS — M6281 Muscle weakness (generalized): Secondary | ICD-10-CM

## 2018-11-11 NOTE — Patient Instructions (Signed)
Access Code: [email or web address]  URL: [email or web address]  Date: 11/11/2018  Prepared by: Venetia Night Orton Capell   Exercises  Supine Posterior Pelvic Tilt - 10 reps - 2 sets - 5 hold - 1x daily - 7x weekly  Supine Piriformis Stretch - 10 reps - 3 sets - 30 hold - 1x daily - 7x weekly  Hooklying Single Knee to Chest Stretch - 10 reps - 2 sets - 5 hold - 1x daily - 7x weekly  Bridge - 10 reps - 2 sets - 1x daily - 7x weekly  Hooklying Isometric Hip Flexion - 10 reps - 2 sets - 5 hold - 1x daily - 7x weekly  Supine Isometric Shoulder Extension with Towel - 10 reps - 2 sets - 5 hold - 1x daily - 7x weekly  Hip Flexor Stretch on Step - 10 reps - 1 sets - 5 hold - 1x daily - 7x weekly  Heel Toe Raises with Counter Support - 20 reps - 1 sets - 1x daily - 7x weekly  Side to Side Weight Shift with Counter Support - 20 reps - 1 sets - 1x daily - 7x weekly  Standing March with Unilateral Counter Support - 20 reps - 1 sets - 1x daily - 7x weekly  Standing Hip Abduction with Counter Support - 5 reps - 2 sets - 1x daily - 7x weekly  Standing Hip Extension with Counter Support - 5 reps - 2 sets - 1x daily - 7x weekly

## 2018-11-11 NOTE — Therapy (Signed)
[facility] [address] [place], [place], [address] Phone: [phone]   Fax:  [phone]  Physical Therapy Treatment  Patient Details  Name: Susan Marks MRN: [id number] Date of Birth: 1925/12/11 Referring Provider (PT): Levy Pupa, PA-C   Encounter Date: 11/11/2018  PT End of Session - 11/11/18 1146    Visit Number  4    Date for PT Re-Evaluation  12/23/18    Authorization Type  [employer]    PT Start Time  1120    PT Stop Time  1200    PT Time Calculation (min)  40 min    Activity Tolerance  Patient tolerated treatment well;Patient limited by pain    Behavior During Therapy  [facility] for tasks assessed/performed       Past Medical History:  Diagnosis Date  . Chronic headaches    migraines  . Chronic kidney disease   . High blood pressure   . High cholesterol     Past Surgical History:  Procedure Laterality Date  . VESICOVAGINAL FISTULA CLOSURE W/ TAH      There were no vitals filed for this visit.  Subjective Assessment - 11/11/18 1122    Subjective  My pain is about a 2-3/10 in Rt low back and hip today.  I've been trying the stomach muscle exercise and it is helpful.    Pertinent History  scoliosis, osteoporosis    Limitations  House hold activities    How long can you sit comfortably?  can't tell    How long can you stand comfortably?  30    Patient Stated Goals  reduce pain, learn how to do household tasks with less pain (laundry - front loading)    Currently in Pain?  Yes    Pain Score  2     Pain Location  Back    Pain Orientation  Right;Mid    Pain Descriptors / Indicators  Aching    Pain Type  Chronic pain    Aggravating Factors   laundry    Pain Relieving Factors  laying down, heat/ice    Effect of Pain on Daily Activities  household tasks                       [facility] Adult PT Treatment/Exercise - 11/11/18 0001      Lumbar Exercises: Stretches   Single Knee to Chest Stretch  5 reps;Left;Right;10 seconds    Piriformis Stretch  Left;Right;30 seconds;1 rep      Knee/Hip Exercises: Standing   Heel Raises  Both;20 reps    Heel Raises Limitations  with toe raises    Hip Abduction  Stengthening;Both;5 reps;2 sets    Hip Extension  Stengthening;Knee straight;5 reps;2 sets;Both    Other Standing Knee Exercises  marching toe taps edge of TM bil handhold, PT cued glute med in standing, weight shifts with glute med cueing to prep, x 20 reps    Other Standing Knee Exercises  quarter squats 2x5 with bil handhold             PT Education - 11/11/18 1146    Education Details  Access Code: [email or web address]    Person(s) Educated  Patient    Methods  Explanation;Demonstration;Verbal cues;Tactile cues;Handout    Comprehension  Verbalized understanding;Returned demonstration       PT Short Term Goals - 11/11/18 1153      PT SHORT TERM GOAL #1   Title  Pt will  be ind in initial HEP    Status  On-going      PT SHORT TERM GOAL #2   Title  Pt will report pain reduction of Rt lower quadrant and LE by at least 20%    Baseline  Rt anterior hip is improved    Status  On-going        PT Long Term Goals - 10/28/18 1902      PT LONG TERM GOAL #1   Title  Pt will be ind in advanced HEP and report compliance at least three days a week.    Time  8    Period  Weeks    Status  New    Target Date  12/23/18      PT LONG TERM GOAL #2   Title  Pt will report at least 50% reduction in pain with daily activities around the house such as laundry and light housework.    Time  8    Period  Weeks    Status  New    Target Date  12/23/18      PT LONG TERM GOAL #3   Title  Pt will improve 5x sit to stand to </= 15 sec with proper body mechanics and without need of UEs    Time  8    Period  Weeks    Status  New    Target Date  12/23/18      PT LONG TERM GOAL #4   Title  Pt will report improved stability evident by her report of no LOB from the start  of therapy to decrease her risk of falling    Time  8    Period  Weeks    Status  New    Target Date  12/23/18      PT LONG TERM GOAL #5   Title  Pt will achieve at least 4/5 strength rating of LE muscle groups and trunk to improve performance of daily tasks such as laundry and stairs to enter house.    Time  8    Period  Weeks    Status  New    Target Date  12/23/18            Plan - 11/11/18 1147    Clinical Impression Statement  Pt reported improved awareness of TrA since last visit and said it helps her stand talller and improves her pain. She is having less Rt anterior hip "twinges" but did encounter pain with Rt SKTC today.  PT introduced standing countertop ther ex and updated HEP with these today.  PT needed to cue Pt about glute med during standing ther ex for closed chain LE.  She will benefit from review of these next visit.  PT used heat for lumbar with supine ther ex today.    Comorbidities  osteoporosis, scoliosis, fall risk    Examination-Activity Limitations  Bend;Sleep;Carry;Squat;Stand;Locomotion Level;Lift    Examination-Participation Restrictions  Laundry;Volunteer;Driving;Community Activity    Rehab Potential  Good    PT Frequency  2x / week    PT Duration  8 weeks    PT Treatment/Interventions  ADLs/Self Care Home Management;Aquatic Therapy;Cryotherapy;Electrical Stimulation;Iontophoresis 4mg /ml Dexamethasone;Moist Heat;Traction;Gait training;Stair training;Functional mobility training;Therapeutic activities;Therapeutic exercise;Balance training;Neuromuscular re-education;Patient/family education;Manual techniques;Passive range of motion;Dry needling;Spinal Manipulations;Joint Manipulations    PT Next Visit Plan  f/u on TrA and use of for household tasks, moist heat low back during supine ther ex, continue glute med weight shifts and review standing HEP from last visit  PT Home Exercise Plan  Access Code: [email or web address]    Consulted and Agree with Plan of Care   Patient       Patient will benefit from skilled therapeutic intervention in order to improve the following deficits and impairments:  Abnormal gait, Decreased range of motion, Difficulty walking, Increased fascial restricitons, Decreased endurance, Increased muscle spasms, Decreased activity tolerance, Pain, Decreased balance, Impaired flexibility, Improper body mechanics, Decreased mobility, Decreased strength, Postural dysfunction  Visit Diagnosis: Chronic right-sided low back pain with right-sided sciatica  Pain in right hip  Muscle weakness (generalized)  Abnormal posture     Problem List Patient Active Problem List   Diagnosis Date Noted  . LBBB (left bundle branch block) 03/30/2018  . Diverticulitis of intestine with perforation without abscess 03/30/2018  . Chest pain 03/10/2014  . Benign essential HTN 03/10/2014  . Abnormal EKG 03/10/2014  . GERD (gastroesophageal reflux disease) 07/07/2013  . Granulomatous lung disease ([place]) 07/07/2013  . Cough 06/13/2013    Johanna Beuhring, PT 11/11/18 11:57 AM   [facility] [address], [place], [place], [address] Phone: [phone]   Fax:  [phone]  Name: Susan Marks MRN: [id number] Date of Birth: 1925-03-23

## 2018-11-13 ENCOUNTER — Ambulatory Visit: Payer: PPO | Admitting: Physical Therapy

## 2018-11-13 ENCOUNTER — Encounter: Payer: Self-pay | Admitting: Physical Therapy

## 2018-11-13 ENCOUNTER — Other Ambulatory Visit: Payer: Self-pay

## 2018-11-13 DIAGNOSIS — M5441 Lumbago with sciatica, right side: Secondary | ICD-10-CM | POA: Diagnosis not present

## 2018-11-13 DIAGNOSIS — M25511 Pain in right shoulder: Secondary | ICD-10-CM

## 2018-11-13 DIAGNOSIS — R262 Difficulty in walking, not elsewhere classified: Secondary | ICD-10-CM

## 2018-11-13 DIAGNOSIS — G8929 Other chronic pain: Secondary | ICD-10-CM

## 2018-11-13 DIAGNOSIS — R293 Abnormal posture: Secondary | ICD-10-CM

## 2018-11-13 DIAGNOSIS — M25551 Pain in right hip: Secondary | ICD-10-CM

## 2018-11-13 DIAGNOSIS — R2681 Unsteadiness on feet: Secondary | ICD-10-CM

## 2018-11-13 DIAGNOSIS — M6281 Muscle weakness (generalized): Secondary | ICD-10-CM

## 2018-11-13 NOTE — Therapy (Signed)
[facility] [address] [place], [place], [address] Phone: [phone]   Fax:  [phone]  Physical Therapy Treatment  Patient Details  Name: Susan Marks MRN: [id number] Date of Birth: 07/16/1925 Referring Provider (PT): Levy Pupa, [place]   Encounter Date: 11/13/2018  PT End of Session - 11/13/18 1105    Visit Number  5    Date for PT Re-Evaluation  12/23/18    Authorization Type  [employer]    PT Start Time  1100    PT Stop Time  1140    PT Time Calculation (min)  40 min    Activity Tolerance  Patient tolerated treatment well    Behavior During Therapy  [facility] for tasks assessed/performed       Past Medical History:  Diagnosis Date  . Chronic headaches    migraines  . Chronic kidney disease   . High blood pressure   . High cholesterol     Past Surgical History:  Procedure Laterality Date  . VESICOVAGINAL FISTULA CLOSURE W/ TAH      There were no vitals filed for this visit.  Subjective Assessment - 11/13/18 1106    Subjective  My pain has intermittently benn bad last 24 hrs. Currently 3/10 after using pain patch, but 10 minutes ago it was a 6-7/10.    Pertinent History  scoliosis, osteoporosis    Currently in Pain?  Yes    Pain Score  3     Pain Location  Back    Pain Orientation  Lower    Pain Descriptors / Indicators  Dull;Aching    Multiple Pain Sites  No                       [facility] Adult PT Treatment/Exercise - 11/13/18 0001      Lumbar Exercises: Stretches   Single Knee to Chest Stretch  Right;Left;2 reps;20 seconds   VC to relax shoulders   Lower Trunk Rotation  --   10x slow rock Bil   Piriformis Stretch  Right;Left;2 reps;20 seconds      Lumbar Exercises: Aerobic   Nustep  L1 x 3 min PTA present to monitor      Lumbar Exercises: Supine   Pelvic Tilt  --   Ball with PPT 3 sec hold 5x, Verbally cued sequence for pt   Glut Set  --   Ball and  buttocks cocontraction 3 sec 5x    Clam  10 reps    Clam Limitations  yellow band tied tighter    VC for core activation   Bent Knee Raise  10 reps;1 second    Bent Knee Raise Limitations  VC for sequencing      Knee/Hip Exercises: Standing   Heel Raises  Both;1 set;10 reps    Heel Raises Limitations  VC to engage her core  before each lift    Hip Flexion  Stengthening;Both;1 set;10 reps;Knee bent    Hip Abduction  Stengthening;Both;1 set;10 reps    Abduction Limitations  VC for core activation      Knee/Hip Exercises: Supine   Short Arc Quad Sets  Strengthening;Both;1 set;10 reps               PT Short Term Goals - 11/13/18 1125      PT SHORT TERM GOAL #1   Title  Pt will be ind in initial HEP    Time  4    Period  Weeks    Status  Achieved        PT Long Term Goals - 10/28/18 1902      PT LONG TERM GOAL #1   Title  Pt will be ind in advanced HEP and report compliance at least three days a week.    Time  8    Period  Weeks    Status  New    Target Date  12/23/18      PT LONG TERM GOAL #2   Title  Pt will report at least 50% reduction in pain with daily activities around the house such as laundry and light housework.    Time  8    Period  Weeks    Status  New    Target Date  12/23/18      PT LONG TERM GOAL #3   Title  Pt will improve 5x sit to stand to </= 15 sec with proper body mechanics and without need of UEs    Time  8    Period  Weeks    Status  New    Target Date  12/23/18      PT LONG TERM GOAL #4   Title  Pt will report improved stability evident by her report of no LOB from the start of therapy to decrease her risk of falling    Time  8    Period  Weeks    Status  New    Target Date  12/23/18      PT LONG TERM GOAL #5   Title  Pt will achieve at least 4/5 strength rating of LE muscle groups and trunk to improve performance of daily tasks such as laundry and stairs to enter house.    Time  8    Period  Weeks    Status  New    Target  Date  12/23/18            Plan - 11/13/18 1105    Clinical Impression Statement  Pt arrives complaining a little more of pain, saying she wasn't sure she should come today. Pain is only intermittently "bad." Pt ended up performing more reps with her strengthening and was able to get on th eNustep for 3 min which previously she was scared to attempt.    Personal Factors and Comorbidities  Age;Comorbidity 1;Comorbidity 2;Time since onset of injury/illness/exacerbation;Comorbidity 3+    Comorbidities  osteoporosis, scoliosis, fall risk    Examination-Activity Limitations  Bend;Sleep;Carry;Squat;Stand;Locomotion Level;Lift    Examination-Participation Restrictions  Laundry;Volunteer;Driving;Community Activity    Stability/Clinical Decision Making  Evolving/Moderate complexity    Rehab Potential  Good    PT Frequency  2x / week    PT Duration  8 weeks    PT Treatment/Interventions  ADLs/Self Care Home Management;Aquatic Therapy;Cryotherapy;Electrical Stimulation;Iontophoresis 4mg /ml Dexamethasone;Moist Heat;Traction;Gait training;Stair training;Functional mobility training;Therapeutic activities;Therapeutic exercise;Balance training;Neuromuscular re-education;Patient/family education;Manual techniques;Passive range of motion;Dry needling;Spinal Manipulations;Joint Manipulations    PT Next Visit Plan  f/u on TrA and use of for household tasks, moist heat low back during supine ther ex, continue glute med weight shifts and review standing HEP from last visit    PT Home Exercise Plan  Access Code: [email or web address]    Consulted and Agree with Plan of Care  Patient       Patient will benefit from skilled therapeutic intervention in order to improve the following deficits and impairments:  Abnormal gait, Decreased range of motion, Difficulty walking, Increased fascial restricitons, Decreased endurance, Increased muscle spasms, Decreased  activity tolerance, Pain, Decreased balance, Impaired flexibility,  Improper body mechanics, Decreased mobility, Decreased strength, Postural dysfunction  Visit Diagnosis: Chronic right-sided low back pain with right-sided sciatica  Pain in right hip  Muscle weakness (generalized)  Abnormal posture  Unsteadiness on feet  Acute pain of right shoulder  Difficulty walking     Problem List Patient Active Problem List   Diagnosis Date Noted  . LBBB (left bundle branch block) 03/30/2018  . Diverticulitis of intestine with perforation without abscess 03/30/2018  . Chest pain 03/10/2014  . Benign essential HTN 03/10/2014  . Abnormal EKG 03/10/2014  . GERD (gastroesophageal reflux disease) 07/07/2013  . Granulomatous lung disease ([place]) 07/07/2013  . Cough 06/13/2013    Kimberla Driskill, PTA 11/13/2018, 11:43 AM  [facility] [address] [place], [place], [address] Phone: [phone]   Fax:  [phone]  Name: BETHLEHEM DOROW MRN: [id number] Date of Birth: [birth year]

## 2018-11-18 ENCOUNTER — Encounter: Payer: Self-pay | Admitting: Physical Therapy

## 2018-11-18 ENCOUNTER — Ambulatory Visit: Payer: PPO | Admitting: Physical Therapy

## 2018-11-18 ENCOUNTER — Other Ambulatory Visit: Payer: Self-pay

## 2018-11-18 DIAGNOSIS — G8929 Other chronic pain: Secondary | ICD-10-CM

## 2018-11-18 DIAGNOSIS — R293 Abnormal posture: Secondary | ICD-10-CM

## 2018-11-18 DIAGNOSIS — M5441 Lumbago with sciatica, right side: Secondary | ICD-10-CM

## 2018-11-18 DIAGNOSIS — M6281 Muscle weakness (generalized): Secondary | ICD-10-CM

## 2018-11-18 DIAGNOSIS — M25551 Pain in right hip: Secondary | ICD-10-CM

## 2018-11-18 NOTE — Therapy (Signed)
[facility] [address], [place], [place], [address] Phone: [phone]   Fax:  [phone]  Physical Therapy Treatment  Patient Details  Name: Susan Marks MRN: [id number] Date of Birth: Mar 13, 1926 Referring Provider (PT): Levy Pupa, [place]   Encounter Date: 11/18/2018  PT End of Session - 11/18/18 1151    Visit Number  6    Date for PT Re-Evaluation  12/23/18    Authorization Type  [employer]    PT Start Time  1110    PT Stop Time  1153    PT Time Calculation (min)  43 min    Activity Tolerance  Patient tolerated treatment well    Behavior During Therapy  [facility] for tasks assessed/performed       Past Medical History:  Diagnosis Date  . Chronic headaches    migraines  . Chronic kidney disease   . High blood pressure   . High cholesterol     Past Surgical History:  Procedure Laterality Date  . VESICOVAGINAL FISTULA CLOSURE W/ TAH      There were no vitals filed for this visit.  Subjective Assessment - 11/18/18 1115    Subjective  The front of the right of the hip is noticeably better - no more pinching.  Pain is 4/10 without patch this morning.  I do parts of my HEP every day but not all.  I am using my stomach muscles more and it's helping with laundry.    Limitations  House hold activities    How long can you sit comfortably?  can't tell    How long can you stand comfortably?  30    Patient Stated Goals  reduce pain, learn how to do household tasks with less pain (laundry - front loading)    Currently in Pain?  Yes    Pain Score  4     Pain Location  Back    Pain Orientation  Lower    Pain Descriptors / Indicators  Aching;Dull    Pain Type  Chronic pain    Pain Radiating Towards  Rt buttock    Pain Frequency  Intermittent    Aggravating Factors   laundry    Pain Relieving Factors  laying down, heat, pain patches         [facility] PT Assessment - 11/18/18 0001      Sit to Stand    Comments  16 sec 5x sit to stand                   [facility] Adult PT Treatment/Exercise - 11/18/18 0001      Lumbar Exercises: Stretches   Active Hamstring Stretch  Left;Right;20 seconds    Active Hamstring Stretch Limitations  supine with lumbar heat    Single Knee to Chest Stretch  Left;Right;5 reps;10 seconds    Single Knee to Chest Stretch Limitations  with heat, lumbar    Pelvic Tilt  10 reps;5 seconds    Pelvic Tilt Limitations  with heat, lumbar    Piriformis Stretch  Left;Right;2 reps;30 seconds    Piriformis Stretch Limitations  supine with lumbar heat      Lumbar Exercises: Standing   Heel Raises  20 reps    Heel Raises Limitations  with toe raises, 2 hand support      Knee/Hip Exercises: Standing   Hip Abduction  Stengthening;Both;2 sets;5 reps    Abduction Limitations  VC to stand tall through stance leg, use  TrA    Hip Extension  Stengthening;Both;2 sets;5 reps;Knee straight    Extension Limitations  VC to stand tall through stance leg, use TrA      Knee/Hip Exercises: Seated   Long Arc Quad  Strengthening;Both;10 reps;Weights    Long Arc Quad Weight  1 lbs.    Long [employer] Limitations  mild right lateral hip pain, PT cued seated posture and TrA    Marching  20 reps;Strengthening;Weights;Both    Marching Limitations  toe tap TM, PT cued stand tall through hip and pull in TrA    Marching Weights  1 lbs.    Hamstring Curl  Strengthening;Both;10 reps;Weights;1 set    Hamstring Limitations  fatigue     Hamstring Weights  1 lbs.    Sit to Sand  5 reps;2 sets;without UE support               PT Short Term Goals - 11/18/18 1118      PT SHORT TERM GOAL #1   Title  Pt will be ind in initial HEP    Status  Achieved      PT SHORT TERM GOAL #2   Title  Pt will report pain reduction of Rt lower quadrant and LE by at least 20%    Baseline  Rt anterior hip is improved    Status  On-going        PT Long Term Goals - 11/18/18 1119      PT LONG  TERM GOAL #1   Title  Pt will be ind in advanced HEP and report compliance at least three days a week.    Status  On-going      PT LONG TERM GOAL #2   Title  Pt will report at least 50% reduction in pain with daily activities around the house such as laundry and light housework.    Baseline  laundry is improving some days with less pain, Pt reports 25% better 2 days of last week, still variable.    Status  On-going      PT LONG TERM GOAL #3   Title  Pt will improve 5x sit to stand to </= 15 sec with proper body mechanics and without need of UEs    Baseline  16 sec 11/18/18    Status  On-going      PT LONG TERM GOAL #4   Title  Pt will report improved stability evident by her report of no LOB from the start of therapy to decrease her risk of falling    Status  Achieved      PT LONG TERM GOAL #5   Title  Pt will achieve at least 4/5 strength rating of LE muscle groups and trunk to improve performance of daily tasks such as laundry and stairs to enter house.    Status  On-going            Plan - 11/18/18 1152    Clinical Impression Statement  Pt is tolerating increased reps and variety of ther ex.  She arrived with report of more local pain of 2/10 in Rt lateral hip and reports approx 20% less pain with household tasks such as laundry at least 2 days of the week last week.  Rt anterior hip pain continues to be resolved but she has localized pain in Rt lateral hip surounding bursa.  She reported decreased pain of low back with ther ex today compared to pain level on arrival demo'ing good repsonse to  PT regimen.  She is compliant with HEP, performing aspects of it every day.    Rehab Potential  Good    PT Frequency  2x / week    PT Duration  8 weeks    PT Treatment/Interventions  ADLs/Self Care Home Management;Aquatic Therapy;Cryotherapy;Electrical Stimulation;Iontophoresis 4mg /ml Dexamethasone;Moist Heat;Traction;Gait training;Stair training;Functional mobility training;Therapeutic  activities;Therapeutic exercise;Balance training;Neuromuscular re-education;Patient/family education;Manual techniques;Passive range of motion;Dry needling;Spinal Manipulations;Joint Manipulations    PT Next Visit Plan  intro step ups 2" or higher as tol for curb and stair training, continue trunk and LE strenth, spinal ROM, LE stretches    PT Home Exercise Plan  Access Code: [email or web address]    Consulted and Agree with Plan of Care  Patient       Patient will benefit from skilled therapeutic intervention in order to improve the following deficits and impairments:  Abnormal gait, Decreased range of motion, Difficulty walking, Increased fascial restricitons, Decreased endurance, Increased muscle spasms, Decreased activity tolerance, Pain, Decreased balance, Impaired flexibility, Improper body mechanics, Decreased mobility, Decreased strength, Postural dysfunction  Visit Diagnosis: Chronic right-sided low back pain with right-sided sciatica  Pain in right hip  Muscle weakness (generalized)  Abnormal posture     Problem List Patient Active Problem List   Diagnosis Date Noted  . LBBB (left bundle branch block) 03/30/2018  . Diverticulitis of intestine with perforation without abscess 03/30/2018  . Chest pain 03/10/2014  . Benign essential HTN 03/10/2014  . Abnormal EKG 03/10/2014  . GERD (gastroesophageal reflux disease) 07/07/2013  . Granulomatous lung disease ([place]) 07/07/2013  . Cough 06/13/2013   Baruch Merl, PT 11/18/18 11:57 AM   [facility] [address] [place], [place], [address] Phone: [phone]   Fax:  [phone]  Name: Susan Marks MRN: [id number] Date of Birth: 07-04-1925

## 2018-11-20 ENCOUNTER — Ambulatory Visit: Payer: PPO | Attending: Chiropractic Medicine | Admitting: Physical Therapy

## 2018-11-20 ENCOUNTER — Encounter: Payer: Self-pay | Admitting: Physical Therapy

## 2018-11-20 ENCOUNTER — Other Ambulatory Visit: Payer: Self-pay

## 2018-11-20 DIAGNOSIS — R262 Difficulty in walking, not elsewhere classified: Secondary | ICD-10-CM | POA: Diagnosis not present

## 2018-11-20 DIAGNOSIS — G8929 Other chronic pain: Secondary | ICD-10-CM

## 2018-11-20 DIAGNOSIS — M25511 Pain in right shoulder: Secondary | ICD-10-CM | POA: Diagnosis not present

## 2018-11-20 DIAGNOSIS — R2681 Unsteadiness on feet: Secondary | ICD-10-CM | POA: Insufficient documentation

## 2018-11-20 DIAGNOSIS — R293 Abnormal posture: Secondary | ICD-10-CM | POA: Insufficient documentation

## 2018-11-20 DIAGNOSIS — M6281 Muscle weakness (generalized): Secondary | ICD-10-CM | POA: Insufficient documentation

## 2018-11-20 DIAGNOSIS — M25551 Pain in right hip: Secondary | ICD-10-CM | POA: Diagnosis not present

## 2018-11-20 DIAGNOSIS — M5441 Lumbago with sciatica, right side: Secondary | ICD-10-CM | POA: Insufficient documentation

## 2018-11-20 NOTE — Therapy (Signed)
[facility] [address], [place], [place], [address] Phone: [phone]   Fax:  [phone]  Physical Therapy Treatment  Patient Details  Name: Susan Marks MRN: [id number] Date of Birth: November 22, 1925 Referring Provider (PT): Levy Pupa, PA-C   Encounter Date: 11/20/2018  PT End of Session - 11/20/18 1331    Visit Number  7    Date for PT Re-Evaluation  12/23/18    Authorization Type  [employer]    PT Start Time  1331    PT Stop Time  1410    PT Time Calculation (min)  39 min    Activity Tolerance  Patient tolerated treatment well;Patient limited by fatigue    Behavior During Therapy  [facility] for tasks assessed/performed       Past Medical History:  Diagnosis Date  . Chronic headaches    migraines  . Chronic kidney disease   . High blood pressure   . High cholesterol     Past Surgical History:  Procedure Laterality Date  . VESICOVAGINAL FISTULA CLOSURE W/ TAH      There were no vitals filed for this visit.  Subjective Assessment - 11/20/18 1335    Subjective  My pain fluctuates throughout the day.  Right now I'm a 3/10.  I did a lot of laundry this morning and my back didn't start to hurt at all.  I didn't have more pain after last visit but I was so tired so it may have been too much.  I did have Rt hip pain that started when I had to squat to put a big bowl away into a lower cabinet.  It's 3/10 now.    Pertinent History  scoliosis, osteoporosis    Limitations  House hold activities    How long can you sit comfortably?  can't tell    How long can you stand comfortably?  30    Patient Stated Goals  reduce pain, learn how to do household tasks with less pain (laundry - front loading)    Currently in Pain?  Yes    Pain Score  3     Pain Location  Hip    Pain Orientation  Right;Lateral    Pain Descriptors / Indicators  Aching;Dull    Pain Type  Chronic pain    Pain Frequency  Intermittent     Aggravating Factors   laundry    Pain Relieving Factors  laying down, heat, patches    Effect of Pain on Daily Activities  household tasks                       [facility] Adult PT Treatment/Exercise - 11/20/18 0001      Lumbar Exercises: Stretches   Hip Flexor Stretch  Left;Right;30 seconds;2 reps    Hip Flexor Stretch Limitations  foot on first step      Lumbar Exercises: Aerobic   Nustep  L1 x 5', PT present to discuss progres      Lumbar Exercises: Standing   Heel Raises  20 reps    Heel Raises Limitations  with toe raises, 2 hand support    Medical sales representative;Theraband;Both;10 reps    Theraband Level (Row)  Level 1 (Yellow)    Shoulder Extension  Strengthening;Theraband;10 reps;Both    Theraband Level (Shoulder Extension)  Level 1 (Yellow)      Knee/Hip Exercises: Standing   Forward Step Up  Both;1 set;10 reps;Other (comment);Step Height: 2"  Forward Step Up Limitations  with cane   3x with 4" step   Step Down  Both;1 set;10 reps;Step Height: 2";Other (comment)    Step Down Limitations  with cane   3x with 4" step     Knee/Hip Exercises: Seated   Knee/Hip Flexion  hip flexion isometric with UE resistance 5x5 sec PT cued sit talll    Sit to Sand  5 reps;2 sets;without UE support               PT Short Term Goals - 11/18/18 1118      PT SHORT TERM GOAL #1   Title  Pt will be ind in initial HEP    Status  Achieved      PT SHORT TERM GOAL #2   Title  Pt will report pain reduction of Rt lower quadrant and LE by at least 20%    Baseline  Rt anterior hip is improved    Status  On-going        PT Long Term Goals - 11/20/18 1401      PT LONG TERM GOAL #3   Title  Pt will improve 5x sit to stand to </= 15 sec with proper body mechanics and without need of UEs    Baseline  14 sec 11/20/18    Status  Achieved            Plan - 11/20/18 1410    Clinical Impression Statement  PT adjusted reps today as Pt said she was too tired after last  visit.  Her pain fluctuates throughout the day depending on standing activity demands although she is starting to tolerate laundry without pain which is her main goal.  Some deep bending still bothers her Rt hip.  PT introduced UE Tband for posture today and began step ups/downs to simulate curbs for fall risk.  Pt reports PT is helping and left with less pain than she arrived with after PT for the second time this week.    Comorbidities  osteoporosis, scoliosis, fall risk    Rehab Potential  Good    PT Frequency  2x / week    PT Duration  8 weeks    PT Treatment/Interventions  ADLs/Self Care Home Management;Aquatic Therapy;Cryotherapy;Electrical Stimulation;Iontophoresis 4mg /ml Dexamethasone;Moist Heat;Traction;Gait training;Stair training;Functional mobility training;Therapeutic activities;Therapeutic exercise;Balance training;Neuromuscular re-education;Patient/family education;Manual techniques;Passive range of motion;Dry needling;Spinal Manipulations;Joint Manipulations    PT Next Visit Plan  4" step ups, sit to stands, f/u on UE yellow tband rows/extension, heel/toe raises, balance    PT Home Exercise Plan  Access Code: [email or web address]       Patient will benefit from skilled therapeutic intervention in order to improve the following deficits and impairments:     Visit Diagnosis: Chronic right-sided low back pain with right-sided sciatica  Pain in right hip  Muscle weakness (generalized)  Abnormal posture     Problem List Patient Active Problem List   Diagnosis Date Noted  . LBBB (left bundle branch block) 03/30/2018  . Diverticulitis of intestine with perforation without abscess 03/30/2018  . Chest pain 03/10/2014  . Benign essential HTN 03/10/2014  . Abnormal EKG 03/10/2014  . GERD (gastroesophageal reflux disease) 07/07/2013  . Granulomatous lung disease ([place]) 07/07/2013  . Cough 06/13/2013   Maveryck Bahri, PT 11/20/18 2:14 PM   [facility] [address], [place], [place], [address] Phone: [phone]   Fax:  [phone]  Name: Susan Marks MRN: [id number] Date of Birth: 09-29-1925

## 2018-11-27 ENCOUNTER — Encounter: Payer: Self-pay | Admitting: Physical Therapy

## 2018-11-27 ENCOUNTER — Ambulatory Visit: Payer: PPO | Admitting: Physical Therapy

## 2018-11-27 ENCOUNTER — Other Ambulatory Visit: Payer: Self-pay

## 2018-11-27 DIAGNOSIS — R262 Difficulty in walking, not elsewhere classified: Secondary | ICD-10-CM

## 2018-11-27 DIAGNOSIS — G8929 Other chronic pain: Secondary | ICD-10-CM

## 2018-11-27 DIAGNOSIS — M25551 Pain in right hip: Secondary | ICD-10-CM

## 2018-11-27 DIAGNOSIS — M25511 Pain in right shoulder: Secondary | ICD-10-CM

## 2018-11-27 DIAGNOSIS — M6281 Muscle weakness (generalized): Secondary | ICD-10-CM

## 2018-11-27 DIAGNOSIS — M5441 Lumbago with sciatica, right side: Secondary | ICD-10-CM | POA: Diagnosis not present

## 2018-11-27 DIAGNOSIS — R293 Abnormal posture: Secondary | ICD-10-CM

## 2018-11-27 DIAGNOSIS — R2681 Unsteadiness on feet: Secondary | ICD-10-CM

## 2018-11-27 NOTE — Therapy (Signed)
[facility] [address] [place], [place], [address] Phone: [phone]   Fax:  [phone]  Physical Therapy Treatment  Patient Details  Name: Susan Marks MRN: [id number] Date of Birth: 1925-06-17 Referring Provider (PT): Levy Pupa, PA-C   Encounter Date: 11/27/2018  PT End of Session - 11/27/18 1401    Visit Number  8    Date for PT Re-Evaluation  12/23/18    Authorization Type  [employer]    PT Start Time  1402    PT Stop Time  1442    PT Time Calculation (min)  40 min    Activity Tolerance  Patient tolerated treatment well;Patient limited by fatigue    Behavior During Therapy  [facility] for tasks assessed/performed       Past Medical History:  Diagnosis Date  . Chronic headaches    migraines  . Chronic kidney disease   . High blood pressure   . High cholesterol     Past Surgical History:  Procedure Laterality Date  . VESICOVAGINAL FISTULA CLOSURE W/ TAH      There were no vitals filed for this visit.  Subjective Assessment - 11/27/18 1403    Subjective  Pain is around a 3/10. When I have pain my endurance isn't as good.    Pertinent History  scoliosis, osteoporosis    Currently in Pain?  Yes    Pain Score  3     Pain Location  --   Rt hip & back   Pain Orientation  Right    Pain Descriptors / Indicators  Discomfort    Aggravating Factors   Overoding it    Pain Relieving Factors  Laying down, heat    Multiple Pain Sites  No                       [facility] Adult PT Treatment/Exercise - 11/27/18 0001      Lumbar Exercises: Stretches   Active Hamstring Stretch  --   Seated Bil 2x 20   Other Lumbar Stretch Exercise  Supine RT QL stretch/leg lengthener 5x 5 sec holds      Lumbar Exercises: Standing   Heel Raises  20 reps    Heel Raises Limitations  with toe raises, 2 hand support    Medical sales representative;Theraband;Both;10 reps    Theraband Level (Row)  Level 1  (Yellow)    Shoulder Extension  Strengthening;Theraband;10 reps;Both    Theraband Level (Shoulder Extension)  Level 1 (Yellow)      Lumbar Exercises: Seated   Other Seated Lumbar Exercises  LT sidebend stretch 10 sec x5 , VC to draw Rt hip down as she leaned to the LT      Knee/Hip Exercises: Standing   Forward Step Up  Both;1 set;10 reps;Hand Hold: 2;Step Height: 6"    Step Down  Both;1 set;10 reps;Step Height: 2";Step Height: 6"      Knee/Hip Exercises: Seated   Sit to Sand  1 set;10 reps;without UE support   isometric squeeze on blue ball              PT Short Term Goals - 11/27/18 1426      PT SHORT TERM GOAL #2   Title  Pt will report pain reduction of Rt lower quadrant and LE by at least 20%    Time  4    Period  Weeks    Status  On-going   Ant hip  pain abolished, no change post RT back/hip.       PT Long Term Goals - 11/20/18 1401      PT LONG TERM GOAL #3   Title  Pt will improve 5x sit to stand to </= 15 sec with proper body mechanics and without need of UEs    Baseline  14 sec 11/20/18    Status  Achieved            Plan - 11/27/18 1402    Clinical Impression Statement  Pt reports doing well with her exercises both in the clinic and at home. She is very concerned with "overdoing." Pain has stayed low level as of recent. We did have a moment where pt needed to sit fairly quickly due to feeling "woozie." O2 sat was checked: 97%. Pt did prefer to do more sitting and supine stretching  exercises after this episode.    Personal Factors and Comorbidities  Age;Comorbidity 1;Comorbidity 2;Time since onset of injury/illness/exacerbation;Comorbidity 3+    Comorbidities  osteoporosis, scoliosis, fall risk    Examination-Activity Limitations  Bend;Sleep;Carry;Squat;Stand;Locomotion Level;Lift    Examination-Participation Restrictions  Laundry;Volunteer;Driving;Community Activity    Stability/Clinical Decision Making  Evolving/Moderate complexity    Rehab  Potential  Good    PT Frequency  2x / week    PT Duration  8 weeks    PT Treatment/Interventions  ADLs/Self Care Home Management;Aquatic Therapy;Cryotherapy;Electrical Stimulation;Iontophoresis 4mg /ml Dexamethasone;Moist Heat;Traction;Gait training;Stair training;Functional mobility training;Therapeutic activities;Therapeutic exercise;Balance training;Neuromuscular re-education;Patient/family education;Manual techniques;Passive range of motion;Dry needling;Spinal Manipulations;Joint Manipulations    PT Next Visit Plan  6" step ups, sit to stands, yellow/red tband rows/extension, heel/toe raises, balance    PT Home Exercise Plan  Access Code: [email or web address]    Consulted and Agree with Plan of Care  Patient       Patient will benefit from skilled therapeutic intervention in order to improve the following deficits and impairments:  Abnormal gait, Decreased range of motion, Difficulty walking, Increased fascial restricitons, Decreased endurance, Increased muscle spasms, Decreased activity tolerance, Pain, Decreased balance, Impaired flexibility, Improper body mechanics, Decreased mobility, Decreased strength, Postural dysfunction  Visit Diagnosis: Chronic right-sided low back pain with right-sided sciatica  Pain in right hip  Muscle weakness (generalized)  Abnormal posture  Unsteadiness on feet  Acute pain of right shoulder  Difficulty walking     Problem List Patient Active Problem List   Diagnosis Date Noted  . LBBB (left bundle branch block) 03/30/2018  . Diverticulitis of intestine with perforation without abscess 03/30/2018  . Chest pain 03/10/2014  . Benign essential HTN 03/10/2014  . Abnormal EKG 03/10/2014  . GERD (gastroesophageal reflux disease) 07/07/2013  . Granulomatous lung disease ([place]) 07/07/2013  . Cough 06/13/2013    Kada Friesen, PTA 11/27/2018, 2:45 PM  [facility] [address], [place],  [place], [address] Phone: [phone]   Fax:  [phone]  Name: Susan Marks MRN: [id number] Date of Birth: 1925-07-29

## 2018-11-29 ENCOUNTER — Encounter: Payer: PPO | Admitting: Physical Therapy

## 2018-12-02 ENCOUNTER — Encounter: Payer: Self-pay | Admitting: Physical Therapy

## 2018-12-02 ENCOUNTER — Ambulatory Visit: Payer: PPO | Admitting: Physical Therapy

## 2018-12-02 ENCOUNTER — Other Ambulatory Visit: Payer: Self-pay

## 2018-12-02 DIAGNOSIS — M25551 Pain in right hip: Secondary | ICD-10-CM

## 2018-12-02 DIAGNOSIS — G8929 Other chronic pain: Secondary | ICD-10-CM

## 2018-12-02 DIAGNOSIS — R293 Abnormal posture: Secondary | ICD-10-CM

## 2018-12-02 DIAGNOSIS — R2681 Unsteadiness on feet: Secondary | ICD-10-CM

## 2018-12-02 DIAGNOSIS — M6281 Muscle weakness (generalized): Secondary | ICD-10-CM

## 2018-12-02 DIAGNOSIS — M5441 Lumbago with sciatica, right side: Secondary | ICD-10-CM | POA: Diagnosis not present

## 2018-12-02 NOTE — Patient Instructions (Signed)
Access Code: [email or web address]  URL: [email or web address]  Date: 12/02/2018  Prepared by: Venetia Night Beuhring   Exercises  Supine Posterior Pelvic Tilt - 10 reps - 2 sets - 5 hold - 1x daily - 7x weekly  Supine Piriformis Stretch - 10 reps - 3 sets - 30 hold - 1x daily - 7x weekly  Hooklying Single Knee to Chest Stretch - 10 reps - 2 sets - 5 hold - 1x daily - 7x weekly  Bridge - 10 reps - 2 sets - 1x daily - 7x weekly  Hooklying Isometric Hip Flexion - 10 reps - 2 sets - 5 hold - 1x daily - 7x weekly  Supine Isometric Shoulder Extension with Towel - 10 reps - 2 sets - 5 hold - 1x daily - 7x weekly  Hip Flexor Stretch on Step - 10 reps - 1 sets - 5 hold - 1x daily - 7x weekly  Heel Toe Raises with Counter Support - 20 reps - 1 sets - 1x daily - 7x weekly  Side to Side Weight Shift with Counter Support - 20 reps - 1 sets - 1x daily - 7x weekly  Standing March with Unilateral Counter Support - 20 reps - 1 sets - 1x daily - 7x weekly  Standing Hip Abduction with Counter Support - 5 reps - 2 sets - 1x daily - 7x weekly  Standing Hip Extension with Counter Support - 5 reps - 2 sets - 1x daily - 7x weekly  Seated Sidebending Arms Overhead - 3 reps - 1 sets - 10 hold - 1x daily - 7x weekly

## 2018-12-02 NOTE — Therapy (Signed)
[facility] [address], [place], [place], [address] Phone: [phone]   Fax:  [phone]  Physical Therapy Treatment  Patient Details  Name: Susan Marks MRN: [id number] Date of Birth: 1926-03-08 Referring Provider (PT): Levy Pupa, [place]   Encounter Date: 12/02/2018  PT End of Session - 12/02/18 1124    Visit Number  9    Date for PT Re-Evaluation  12/23/18    Authorization Type  [employer]    PT Start Time  1116    PT Stop Time  1202    PT Time Calculation (min)  46 min    Activity Tolerance  Patient tolerated treatment well;Patient limited by fatigue    Behavior During Therapy  [facility] for tasks assessed/performed       Past Medical History:  Diagnosis Date  . Chronic headaches    migraines  . Chronic kidney disease   . High blood pressure   . High cholesterol     Past Surgical History:  Procedure Laterality Date  . VESICOVAGINAL FISTULA CLOSURE W/ TAH      There were no vitals filed for this visit.  Subjective Assessment - 12/02/18 1122    Subjective  I have some good days and some bad days.  the bad days are when the pain comes and stays, usually if I've had to do more laundry or cleaning.  Today was a 4/10 but now 1-2/10 since I'm up and showered.    Pertinent History  scoliosis, osteoporosis    How long can you sit comfortably?  can't tell    How long can you stand comfortably?  30    Patient Stated Goals  reduce pain, learn how to do household tasks with less pain (laundry - front loading)    Currently in Pain?  Yes    Pain Score  2     Pain Location  Back    Pain Orientation  Right    Pain Descriptors / Indicators  Discomfort    Pain Type  Chronic pain    Pain Onset  More than a month ago    Pain Frequency  Intermittent                       [facility] Adult PT Treatment/Exercise - 12/02/18 0001      Exercises   Exercises  Lumbar;Knee/Hip;Shoulder      Lumbar Exercises: Aerobic   Nustep  4', 2' L2, 2' L1, PT present to monitor response      Lumbar Exercises: Seated   Other Seated Lumbar Exercises  Lt trunk sidebending stretch for QL 2x10 bil (added to HEP), PT cued arm overhead like a rainbow and to fix pelvis      Knee/Hip Exercises: Standing   Heel Raises  Both;15 reps    Heel Raises Limitations  with toe raises    Hip Abduction  Stengthening;Both;2 sets;5 reps;Knee straight    Hip Extension  Stengthening;Both;2 sets;5 reps;Knee straight    Forward Step Up  Both;3 sets;Hand Hold: 2;Step Height: 6"    Forward Step Up Limitations  limited to 3, end of session fatigue reported by Pt    Other Standing Knee Exercises  marching toe taps edge of treadmill x 20, PT cued weight shift and glute med      Knee/Hip Exercises: Seated   Long Arc Quad  Strengthening;Both;1 set;5 reps    Long Arc Quad Limitations  yellow tband around ankles  Ball Squeeze  5x5 sec    Clamshell with TheraBand  Yellow   1x10, PT cued slow control     Shoulder Exercises: Seated   Row  Strengthening;Theraband;10 reps;Both    Theraband Level (Shoulder Row)  Level 1 (Yellow)    Row Limitations  PT TC for scapular retraction             PT Education - 12/02/18 1211    Education Details  Access Code: [email or web address]    Person(s) Educated  Patient    Methods  Explanation;Demonstration;Tactile cues;Verbal cues   needs handout next time, printer not working   Comprehension  Verbalized understanding       PT Short Term Goals - 11/27/18 1426      PT SHORT TERM GOAL #2   Title  Pt will report pain reduction of Rt lower quadrant and LE by at least 20%    Time  4    Period  Weeks    Status  On-going   Ant hip pain abolished, no change post RT back/hip.       PT Long Term Goals - 12/02/18 1216      PT LONG TERM GOAL #1   Title  Pt will be ind in advanced HEP and report compliance at least three days a week.    Status  On-going      PT LONG TERM GOAL #2    Title  Pt will report at least 50% reduction in pain with daily activities around the house such as laundry and light housework.    Baseline  laundry is improving some days with less pain, Pt reports 25% better 2 days of last week, still variable.    Status  On-going      PT LONG TERM GOAL #4   Title  Pt will report improved stability evident by her report of no LOB from the start of therapy to decrease her risk of falling    Status  On-going      PT LONG TERM GOAL #5   Title  Pt will achieve at least 4/5 strength rating of LE muscle groups and trunk to improve performance of daily tasks such as laundry and stairs to enter house.    Status  On-going            Plan - 12/02/18 1214    Clinical Impression Statement  Pt has good days and bad days.  She notes she is using her abdominals more during household tasks which help.  Her pain rating on arrival today was 2-3/10 and "better" as she left, demo'ing a good response to PT session.  PT added seated QL stretch to HEP as performing it supine was difficult for Pt to recreate and feel.  She reported leg fatigue end of session.  She will continue to benefit from ongoing PT along current POC.    Comorbidities  osteoporosis, scoliosis, fall risk    PT Frequency  2x / week    PT Duration  8 weeks    PT Treatment/Interventions  ADLs/Self Care Home Management;Aquatic Therapy;Cryotherapy;Electrical Stimulation;Iontophoresis 4mg /ml Dexamethasone;Moist Heat;Traction;Gait training;Stair training;Functional mobility training;Therapeutic activities;Therapeutic exercise;Balance training;Neuromuscular re-education;Patient/family education;Manual techniques;Passive range of motion;Dry needling;Spinal Manipulations;Joint Manipulations    PT Next Visit Plan  give Pt QL stretch handout, 6" step ups, sit to stands, yellow/red tband rows/extension, heel/toe raises, balance    PT Home Exercise Plan  Access Code: [email or web address]    Consulted and Agree with Plan of Care   Patient  Patient will benefit from skilled therapeutic intervention in order to improve the following deficits and impairments:     Visit Diagnosis: Chronic right-sided low back pain with right-sided sciatica  Pain in right hip  Muscle weakness (generalized)  Abnormal posture  Unsteadiness on feet     Problem List Patient Active Problem List   Diagnosis Date Noted  . LBBB (left bundle branch block) 03/30/2018  . Diverticulitis of intestine with perforation without abscess 03/30/2018  . Chest pain 03/10/2014  . Benign essential HTN 03/10/2014  . Abnormal EKG 03/10/2014  . GERD (gastroesophageal reflux disease) 07/07/2013  . Granulomatous lung disease ([place]) 07/07/2013  . Cough 06/13/2013    Baruch Merl, PT 12/02/18 12:17 PM   [facility] [address] [place], [place], [address] Phone: [phone]   Fax:  [phone]  Name: ZEYNEB ZIMBELMAN MRN: [id number] Date of Birth: 1925/11/30

## 2018-12-04 ENCOUNTER — Other Ambulatory Visit: Payer: Self-pay

## 2018-12-04 ENCOUNTER — Ambulatory Visit: Payer: PPO | Admitting: Physical Therapy

## 2018-12-04 ENCOUNTER — Encounter: Payer: Self-pay | Admitting: Physical Therapy

## 2018-12-04 DIAGNOSIS — R293 Abnormal posture: Secondary | ICD-10-CM

## 2018-12-04 DIAGNOSIS — M25551 Pain in right hip: Secondary | ICD-10-CM

## 2018-12-04 DIAGNOSIS — M6281 Muscle weakness (generalized): Secondary | ICD-10-CM

## 2018-12-04 DIAGNOSIS — M5441 Lumbago with sciatica, right side: Secondary | ICD-10-CM | POA: Diagnosis not present

## 2018-12-04 DIAGNOSIS — G8929 Other chronic pain: Secondary | ICD-10-CM

## 2018-12-04 DIAGNOSIS — R2681 Unsteadiness on feet: Secondary | ICD-10-CM

## 2018-12-04 NOTE — Therapy (Signed)
[facility] [address] [place], [place], [address] Phone: [phone]   Fax:  [phone]  Physical Therapy Treatment  Patient Details  Name: Susan Marks MRN: [id number] Date of Birth: 1925/11/15 Referring Provider (PT): Levy Pupa, PA-C  Progress Note Reporting Period 10/28/18 to 12/04/18  See note below for Objective Data and Assessment of Progress/Goals.      Encounter Date: 12/04/2018  PT End of Session - 12/04/18 1106    Visit Number  10    Date for PT Re-Evaluation  12/23/18    Authorization Type  [employer]    PT Start Time  1030    PT Stop Time  1115    PT Time Calculation (min)  45 min    Activity Tolerance  Patient tolerated treatment well;Patient limited by fatigue;No increased pain    Behavior During Therapy  WFL for tasks assessed/performed       Past Medical History:  Diagnosis Date  . Chronic headaches    migraines  . Chronic kidney disease   . High blood pressure   . High cholesterol     Past Surgical History:  Procedure Laterality Date  . VESICOVAGINAL FISTULA CLOSURE W/ TAH      There were no vitals filed for this visit.  Subjective Assessment - 12/04/18 1040    Subjective  Pain is a little bit up right now but it fluctuates throughout the day.  Right now a 5/10.  I do use my stomach muscles more during my household tasks and with walking which helps my pain and even relieves it some.  Right anterior hip pain is gone but right hip still hurts on side and buttock and in low back on right.    Pertinent History  scoliosis, osteoporosis    Limitations  House hold activities    How long can you sit comfortably?  can't tell    How long can you stand comfortably?  30    Patient Stated Goals  reduce pain, learn how to do household tasks with less pain (laundry - front loading)    Currently in Pain?  Yes    Pain Score  6     Pain Location  Back    Pain Orientation   Right    Pain Descriptors / Indicators  Aching    Pain Type  Chronic pain    Pain Radiating Towards  Rt posterior and lateral buttock    Pain Onset  More than a month ago    Pain Frequency  Intermittent    Aggravating Factors   laundry, overdoing it    Pain Relieving Factors  laying down, heat    Effect of Pain on Daily Activities  household tasks         [facility] PT Assessment - 12/04/18 0001      Assessment   Medical Diagnosis  M48.061 (ICD-10-CM) - Spinal stenosis, lumbar region without neurogenic claudication    Referring Provider (PT)  Levy Pupa, PA-C    Hand Dominance  Right    Next MD Visit  no    Prior Therapy  yes      Balance Screen   Has the patient fallen in the past 6 months  No      Observation/Other Assessments   Focus on Therapeutic Outcomes (FOTO)   56%      Sit to Stand   Comments  16 sec 5x sit to stand      Posture/Postural Control  Postural Limitations  Decreased lumbar lordosis;Increased thoracic kyphosis;Flexed trunk;Left pelvic obliquity      AROM   Overall AROM Comments  bil hip ER 35 deg, trunk ROM limited all planes by 50%, pain with trunk flexion, rigid scoliosis limits motion      Strength   Overall Strength Comments  4/5 bil hip flexors, bil knee flexion, 4-/5 bil hip abduction and extension                   [facility] Adult PT Treatment/Exercise - 12/04/18 0001      Exercises   Exercises  Lumbar;Knee/Hip;Shoulder      Lumbar Exercises: Stretches   Single Knee to Chest Stretch  3 reps;10 seconds;Right;Left    Pelvic Tilt  15 reps      Lumbar Exercises: Supine   Other Supine Lumbar Exercises  bil UEs press into mat table 10x5 sec    Other Supine Lumbar Exercises  bil UEs press into red ball supine 10x5 sec      Knee/Hip Exercises: Standing   Heel Raises  Both;2 sets;5 reps    Heel Raises Limitations  with toe raises    Forward Step Up  Both;3 sets;Hand Hold: 2;Step Height: 6"    Forward Step Up Limitations  limited to  3, end of session fatigue reported by Pt      Shoulder Exercises: Standing   Row  Strengthening;Theraband;15 reps;Both    Theraband Level (Shoulder Row)  Level 2 (Red)      Modalities   Modalities  Moist Heat      Moist Heat Therapy   Number Minutes Moist Heat  10 Minutes    Moist Heat Location  Lumbar Spine               PT Short Term Goals - 11/27/18 1426      PT SHORT TERM GOAL #2   Title  Pt will report pain reduction of Rt lower quadrant and LE by at least 20%    Time  4    Period  Weeks    Status  On-going   Ant hip pain abolished, no change post RT back/hip.       PT Long Term Goals - 12/04/18 1046      PT LONG TERM GOAL #1   Title  Pt will be ind in advanced HEP and report compliance at least three days a week.    Status  On-going      PT LONG TERM GOAL #2   Title  Pt will report at least 50% reduction in pain with daily activities around the house such as laundry and light housework.    Baseline  using body mechanic strategies, 25% better    Status  On-going      PT LONG TERM GOAL #3   Title  Pt will improve 5x sit to stand to </= 15 sec with proper body mechanics and without need of UEs    Baseline  14 sec 11/20/18    Status  Achieved      PT LONG TERM GOAL #4   Title  Pt will report improved stability evident by her report of no LOB from the start of therapy to decrease her risk of falling    Baseline  pt feels that she is much more steady throughout the day.     Status  Achieved      PT LONG TERM GOAL #5   Title  Pt will achieve at least 4/5  strength rating of LE muscle groups and trunk to improve performance of daily tasks such as laundry and stairs to enter house.    Baseline  4-/5 hip ext and abd    Status  On-going            Plan - 12/04/18 1107    Clinical Impression Statement  Pt learning strategies for body mechanics and core use for increaesd tolerance of household tasks with less pain.  She feels more steady on feet.  Strength  in hips has improved to 4-/5.  Gait continues to be slow and cautious with cane.  FOTO score has improved to 56% demo'ing less limitation.  Her pain continues to fluctuate throughout the day but laying down and doing HEP helps dial pain back down.  Pt is tolerating greater reps and resistance for ther ex.  She will continue to benefit from skilled PT along current POC.    Comorbidities  osteoporosis, scoliosis, fall risk    Examination-Activity Limitations  Bend;Sleep;Carry;Squat;Stand;Locomotion Level;Lift    Examination-Participation Restrictions  Laundry;Volunteer;Driving;Community Activity    Stability/Clinical Decision Making  Evolving/Moderate complexity    PT Frequency  2x / week    PT Duration  8 weeks    PT Treatment/Interventions  ADLs/Self Care Home Management;Aquatic Therapy;Cryotherapy;Electrical Stimulation;Iontophoresis 4mg /ml Dexamethasone;Moist Heat;Traction;Gait training;Stair training;Functional mobility training;Therapeutic activities;Therapeutic exercise;Balance training;Neuromuscular re-education;Patient/family education;Manual techniques;Passive range of motion;Dry needling;Spinal Manipulations;Joint Manipulations    PT Next Visit Plan  continue seated and standing stabilization and LE strength, lumbar ROM/core stability, balance    PT Home Exercise Plan  Access Code: [email or web address]    Consulted and Agree with Plan of Care  Patient       Patient will benefit from skilled therapeutic intervention in order to improve the following deficits and impairments:  Abnormal gait, Decreased range of motion, Difficulty walking, Increased fascial restricitons, Decreased endurance, Increased muscle spasms, Decreased activity tolerance, Pain, Decreased balance, Impaired flexibility, Improper body mechanics, Decreased mobility, Decreased strength, Postural dysfunction  Visit Diagnosis: Chronic right-sided low back pain with right-sided sciatica  Pain in right hip  Muscle weakness  (generalized)  Abnormal posture  Unsteadiness on feet     Problem List Patient Active Problem List   Diagnosis Date Noted  . LBBB (left bundle branch block) 03/30/2018  . Diverticulitis of intestine with perforation without abscess 03/30/2018  . Chest pain 03/10/2014  . Benign essential HTN 03/10/2014  . Abnormal EKG 03/10/2014  . GERD (gastroesophageal reflux disease) 07/07/2013  . Granulomatous lung disease ([place]) 07/07/2013  . Cough 06/13/2013   Gaby Harney, PT 12/04/18 11:15 AM   [facility] [address], [place], [place], [address] Phone: [phone]   Fax:  [phone]  Name: Susan Marks MRN: [id number] Date of Birth: Feb 23, 1926

## 2018-12-09 ENCOUNTER — Ambulatory Visit: Payer: PPO | Admitting: Physical Therapy

## 2018-12-09 ENCOUNTER — Other Ambulatory Visit: Payer: Self-pay

## 2018-12-09 ENCOUNTER — Encounter: Payer: Self-pay | Admitting: Physical Therapy

## 2018-12-09 DIAGNOSIS — G8929 Other chronic pain: Secondary | ICD-10-CM

## 2018-12-09 DIAGNOSIS — M5441 Lumbago with sciatica, right side: Secondary | ICD-10-CM | POA: Diagnosis not present

## 2018-12-09 DIAGNOSIS — R293 Abnormal posture: Secondary | ICD-10-CM

## 2018-12-09 DIAGNOSIS — M25551 Pain in right hip: Secondary | ICD-10-CM

## 2018-12-09 DIAGNOSIS — M6281 Muscle weakness (generalized): Secondary | ICD-10-CM

## 2018-12-09 NOTE — Therapy (Signed)
[facility] [address] [place], [place], [address] Phone: [phone]   Fax:  [phone]  Physical Therapy Treatment  Patient Details  Name: Susan Marks MRN: [id number] Date of Birth: Nov 17, 1925 Referring Provider (PT): Levy Pupa, [place]   Encounter Date: 12/09/2018  PT End of Session - 12/09/18 1125    Visit Number  11    Date for PT Re-Evaluation  12/23/18    Authorization Type  [employer]    PT Start Time  1119    PT Stop Time  1157    PT Time Calculation (min)  38 min    Activity Tolerance  Patient tolerated treatment well;Patient limited by fatigue;No increased pain    Behavior During Therapy  WFL for tasks assessed/performed       Past Medical History:  Diagnosis Date  . Chronic headaches    migraines  . Chronic kidney disease   . High blood pressure   . High cholesterol     Past Surgical History:  Procedure Laterality Date  . VESICOVAGINAL FISTULA CLOSURE W/ TAH      There were no vitals filed for this visit.  Subjective Assessment - 12/09/18 1125    Subjective  I didn't have any big episodes of pain over the weekend.  I did ok. I had an episode of pain this morning but it went away without me having to lay down.  Using the stomach muscles really help.    Pertinent History  scoliosis, osteoporosis    How long can you sit comfortably?  can't tell    How long can you stand comfortably?  30    Patient Stated Goals  reduce pain, learn how to do household tasks with less pain (laundry - front loading)    Currently in Pain?  Yes    Pain Score  2     Pain Location  Back    Pain Orientation  Right    Pain Descriptors / Indicators  Aching    Pain Type  Chronic pain    Pain Onset  More than a month ago    Pain Frequency  Intermittent    Aggravating Factors   laundry, overdoing it    Pain Relieving Factors  laying down, stretches, heat, using stomach muscles                        [facility] Adult PT Treatment/Exercise - 12/09/18 0001      Exercises   Exercises  Lumbar;Knee/Hip      Lumbar Exercises: Aerobic   Nustep  5' L2, PT present to monitor symptoms and discuss progress      Knee/Hip Exercises: Standing   Heel Raises  Both;20 reps    Heel Raises Limitations  with toe raises    Hip Flexion  Stengthening;Knee bent;20 reps;Both    Hip Flexion Limitations  standing on foam pad, PT cued weight shifts and glute med     Hip Abduction  Stengthening;Both;1 set;10 reps;Knee straight    Abduction Limitations  standing on foam pad      Knee/Hip Exercises: Seated   Long Arc Quad  Strengthening;Both;2 sets;10 reps;Weights    Long Arc Quad Weight  2 lbs.    Long Arc Quad Limitations  black pad under feet    Ball Squeeze  5x10 sec with 10 sec TrA and cue to sit tall   black pad under feet   Sit to Sand  2  sets;5 reps;without UE support   ball between knees              PT Short Term Goals - 11/27/18 1426      PT SHORT TERM GOAL #2   Title  Pt will report pain reduction of Rt lower quadrant and LE by at least 20%    Time  4    Period  Weeks    Status  On-going   Ant hip pain abolished, no change post RT back/hip.       PT Long Term Goals - 12/09/18 1200      PT LONG TERM GOAL #2   Title  Pt will report at least 50% reduction in pain with daily activities around the house such as laundry and light housework.    Status  On-going      PT LONG TERM GOAL #3   Title  Pt will improve 5x sit to stand to </= 15 sec with proper body mechanics and without need of UEs    Status  Achieved      PT LONG TERM GOAL #4   Title  Pt will report improved stability evident by her report of no LOB from the start of therapy to decrease her risk of falling    Status  Achieved            Plan - 12/09/18 1159    Clinical Impression Statement  Pt had good pain management over the weekend and continues to benefit from use of deep  core during household tasks.  She had brief pain episode this morning that resolved with use of core and did not have to lay down.  She tolerated increased resistance and reps today and performed standing ther ex on foam pad for increased core and balance challenge.  She was fatigued end of session and declined one more exercise due to fatigue.    Comorbidities  osteoporosis, scoliosis, fall risk    PT Frequency  2x / week    PT Duration  8 weeks    PT Treatment/Interventions  ADLs/Self Care Home Management;Aquatic Therapy;Cryotherapy;Electrical Stimulation;Iontophoresis 4mg /ml Dexamethasone;Moist Heat;Traction;Gait training;Stair training;Functional mobility training;Therapeutic activities;Therapeutic exercise;Balance training;Neuromuscular re-education;Patient/family education;Manual techniques;Passive range of motion;Dry needling;Spinal Manipulations;Joint Manipulations    PT Next Visit Plan  continue seated and standing stabilization and LE strength, lumbar ROM/core stability, balance    PT Home Exercise Plan  Access Code: [email or web address]    Consulted and Agree with Plan of Care  Patient       Patient will benefit from skilled therapeutic intervention in order to improve the following deficits and impairments:     Visit Diagnosis: Chronic right-sided low back pain with right-sided sciatica  Pain in right hip  Muscle weakness (generalized)  Abnormal posture     Problem List Patient Active Problem List   Diagnosis Date Noted  . LBBB (left bundle branch block) 03/30/2018  . Diverticulitis of intestine with perforation without abscess 03/30/2018  . Chest pain 03/10/2014  . Benign essential HTN 03/10/2014  . Abnormal EKG 03/10/2014  . GERD (gastroesophageal reflux disease) 07/07/2013  . Granulomatous lung disease ([place]) 07/07/2013  . Cough 06/13/2013    Fynlee Rowlands, PT 12/09/18 12:01 PM   [facility] [address] [place], [place], [address] Phone: [phone]   Fax:  [phone]  Name: Susan Marks MRN: [id number] Date of Birth: December 24, 1925

## 2018-12-11 ENCOUNTER — Encounter: Payer: Self-pay | Admitting: Physical Therapy

## 2018-12-11 ENCOUNTER — Other Ambulatory Visit: Payer: Self-pay

## 2018-12-11 ENCOUNTER — Ambulatory Visit: Payer: PPO | Admitting: Physical Therapy

## 2018-12-11 DIAGNOSIS — M25551 Pain in right hip: Secondary | ICD-10-CM

## 2018-12-11 DIAGNOSIS — R2681 Unsteadiness on feet: Secondary | ICD-10-CM

## 2018-12-11 DIAGNOSIS — G8929 Other chronic pain: Secondary | ICD-10-CM

## 2018-12-11 DIAGNOSIS — M6281 Muscle weakness (generalized): Secondary | ICD-10-CM

## 2018-12-11 DIAGNOSIS — R293 Abnormal posture: Secondary | ICD-10-CM

## 2018-12-11 DIAGNOSIS — M5441 Lumbago with sciatica, right side: Secondary | ICD-10-CM | POA: Diagnosis not present

## 2018-12-11 NOTE — Therapy (Signed)
[facility] [address], [place], [place], [address] Phone: [phone]   Fax:  [phone]  Physical Therapy Treatment  Patient Details  Name: Susan Marks MRN: [id number] Date of Birth: October 08, 1925 Referring Provider (PT): Levy Pupa, [place]   Encounter Date: 12/11/2018  PT End of Session - 12/11/18 1018    Visit Number  12    Date for PT Re-Evaluation  12/23/18    Authorization Type  [employer]    PT Start Time  1020    PT Stop Time  1059    PT Time Calculation (min)  39 min    Activity Tolerance  Patient tolerated treatment well;Patient limited by fatigue;No increased pain    Behavior During Therapy  WFL for tasks assessed/performed       Past Medical History:  Diagnosis Date  . Chronic headaches    migraines  . Chronic kidney disease   . High blood pressure   . High cholesterol     Past Surgical History:  Procedure Laterality Date  . VESICOVAGINAL FISTULA CLOSURE W/ TAH      There were no vitals filed for this visit.  Subjective Assessment - 12/11/18 1018    Subjective  I have felt a lot better this week than last week. I haven't had to stop anything I've wanted to do for the last two days for pain.    Pertinent History  scoliosis, osteoporosis    Limitations  House hold activities    How long can you sit comfortably?  can't tell    How long can you stand comfortably?  30    Patient Stated Goals  reduce pain, learn how to do household tasks with less pain (laundry - front loading)    Pain Score  0-No pain    Pain Onset  More than a month ago    Pain Frequency  Intermittent                       [facility] Adult PT Treatment/Exercise - 12/11/18 0001      Exercises   Exercises  Lumbar;Knee/Hip      Lumbar Exercises: Aerobic   Nustep  5' L1, PT present to discuss progress      Lumbar Exercises: Seated   Other Seated Lumbar Exercises  ball squeeze 5x5 sec       Lumbar Exercises: Supine   Pelvic Tilt  10 reps;5 seconds    Glut Set  10 reps;3 seconds    Clam  10 reps    Clam Limitations  red band    Other Supine Lumbar Exercises  hip abd red band static hold with UE press into table 5x5 sec      Knee/Hip Exercises: Standing   Heel Raises  Both;20 reps;2 seconds    Heel Raises Limitations  with toe raises    Forward Step Up  Both;5 reps;Hand Hold: 2;Step Height: 6"      Knee/Hip Exercises: Seated   Long Arc Quad  Strengthening;Both;2 sets;10 reps    Long Arc Quad Weight  2 lbs.    Sit to Sand  2 sets;5 reps;without UE support               PT Short Term Goals - 11/27/18 1426      PT SHORT TERM GOAL #2   Title  Pt will report pain reduction of Rt lower quadrant and LE by at least 20%    Time  4    Period  Weeks    Status  On-going   Ant hip pain abolished, no change post RT back/hip.       PT Long Term Goals - 12/11/18 1102      PT LONG TERM GOAL #2   Title  Pt will report at least 50% reduction in pain with daily activities around the house such as laundry and light housework.    Baseline  met for 2 days straight this week, will see if this is consistent next week    Status  On-going      PT LONG TERM GOAL #3   Title  Pt will improve 5x sit to stand to </= 15 sec with proper body mechanics and without need of UEs    Status  Achieved      PT LONG TERM GOAL #4   Title  Pt will report improved stability evident by her report of no LOB from the start of therapy to decrease her risk of falling    Status  Achieved      PT LONG TERM GOAL #5   Title  Pt will achieve at least 4/5 strength rating of LE muscle groups and trunk to improve performance of daily tasks such as laundry and stairs to enter house.    Baseline  improving    Status  On-going            Plan - 12/11/18 1059    Clinical Impression Statement  Pt has had a two day stretch without pain.  She has been able to do all desired acitvity this week without  needing to lay down due to pain.  She continues to grow fatigued by end of session but has progressed to more resistance for some exercises and displays improved postural control and muscle recruitment during seated and standing exercise.  She is nearing the end of her certifcation in 1-2 weeks and will likely be ready to d/c to HEP at that time.    PT Frequency  2x / week    PT Duration  8 weeks    PT Treatment/Interventions  ADLs/Self Care Home Management;Aquatic Therapy;Cryotherapy;Electrical Stimulation;Iontophoresis 93m/ml Dexamethasone;Moist Heat;Traction;Gait training;Stair training;Functional mobility training;Therapeutic activities;Therapeutic exercise;Balance training;Neuromuscular re-education;Patient/family education;Manual techniques;Passive range of motion;Dry needling;Spinal Manipulations;Joint Manipulations    PT Next Visit Plan  continue seated and standing stabilization and LE strength, lumbar ROM/core stability, balance    PT Home Exercise Plan  Access Code: [email or web address]    Consulted and Agree with Plan of Care  Patient       Patient will benefit from skilled therapeutic intervention in order to improve the following deficits and impairments:     Visit Diagnosis: Chronic right-sided low back pain with right-sided sciatica  Pain in right hip  Muscle weakness (generalized)  Abnormal posture  Unsteadiness on feet     Problem List Patient Active Problem List   Diagnosis Date Noted  . LBBB (left bundle branch block) 03/30/2018  . Diverticulitis of intestine with perforation without abscess 03/30/2018  . Chest pain 03/10/2014  . Benign essential HTN 03/10/2014  . Abnormal EKG 03/10/2014  . GERD (gastroesophageal reflux disease) 07/07/2013  . Granulomatous lung disease ([place] 07/07/2013  . Cough 06/13/2013    Susan Marks, PT 12/11/18 11:04 AM   [facility] [address] [place]  [address]Phone: [phone]  Fax:  [phone] Name: Susan LATKAMRN: [id number] of Birth: 514-Apr-1927

## 2018-12-16 ENCOUNTER — Ambulatory Visit: Payer: PPO | Admitting: Physical Therapy

## 2018-12-16 ENCOUNTER — Encounter: Payer: Self-pay | Admitting: Physical Therapy

## 2018-12-16 ENCOUNTER — Other Ambulatory Visit: Payer: Self-pay

## 2018-12-16 DIAGNOSIS — R293 Abnormal posture: Secondary | ICD-10-CM

## 2018-12-16 DIAGNOSIS — M5441 Lumbago with sciatica, right side: Secondary | ICD-10-CM | POA: Diagnosis not present

## 2018-12-16 DIAGNOSIS — M6281 Muscle weakness (generalized): Secondary | ICD-10-CM

## 2018-12-16 DIAGNOSIS — G8929 Other chronic pain: Secondary | ICD-10-CM

## 2018-12-16 DIAGNOSIS — M25551 Pain in right hip: Secondary | ICD-10-CM

## 2018-12-16 NOTE — Therapy (Signed)
[facility] [address], [place], [place], [address] Phone: [phone]   Fax:  [phone]  Physical Therapy Treatment  Patient Details  Name: Susan Marks MRN: [id number] Date of Birth: 07-Oct-1925 Referring Provider (PT): Levy Pupa, [place]   Encounter Date: 12/16/2018  PT End of Session - 12/16/18 1251    Visit Number  13    Date for PT Re-Evaluation  12/23/18    Authorization Type  [employer]    PT Start Time  1210   Pt late   PT Stop Time  1251    PT Time Calculation (min)  41 min    Activity Tolerance  Patient tolerated treatment well;Patient limited by fatigue;No increased pain    Behavior During Therapy  WFL for tasks assessed/performed       Past Medical History:  Diagnosis Date  . Chronic headaches    migraines  . Chronic kidney disease   . High blood pressure   . High cholesterol     Past Surgical History:  Procedure Laterality Date  . VESICOVAGINAL FISTULA CLOSURE W/ TAH      There were no vitals filed for this visit.  Subjective Assessment - 12/16/18 1213    Subjective  I have been having some days without pain at all.  The right hip hurts sometimes but goes away much faster once I lay down than it used to.  Pain is not waking me anymore.  I haven't had pain with laundry or with bending to get things out of the lower cabinets in the kitchen.    Pertinent History  scoliosis, osteoporosis    Limitations  House hold activities    How long can you sit comfortably?  can't tell    How long can you stand comfortably?  30-45 min    Patient Stated Goals  reduce pain, learn how to do household tasks with less pain (laundry - front loading)    Currently in Pain?  No/denies    Pain Score  1    Rt hip was a 6/10 this morning for about 30 min but it's 1/10 now.   Pain Location  Hip    Pain Orientation  Right;Lateral    Pain Descriptors / Indicators  Tender    Pain Type  Chronic pain     Pain Onset  More than a month ago    Pain Frequency  Intermittent    Aggravating Factors   standing too long    Pain Relieving Factors  laying down                       [facility] Adult PT Treatment/Exercise - 12/16/18 0001      Exercises   Exercises  Lumbar;Knee/Hip      Lumbar Exercises: Stretches   Pelvic Tilt  15 reps    Piriformis Stretch  Left;Right;2 reps;20 seconds      Lumbar Exercises: Aerobic   Nustep  6' L1, PT present to review goals, symptoms, progress      Lumbar Exercises: Standing   Heel Raises  20 reps    Heel Raises Limitations  with toe raises      Knee/Hip Exercises: Standing   Hip Extension  Stengthening;1 set;10 reps;Both;Knee straight    Extension Limitations  standing on foam    Other Standing Knee Exercises  marching on foam pad x 20 reps, bil UE support edge of treadmill      Knee/Hip Exercises:  Seated   Long Arc [employer]  Strengthening;Both;2 sets;5 reps;Weights    Long Arc Quad Weight  1 lbs.    Long Arc Quad Limitations  decreased weight as to not wear out patient for later exercises    Ball Squeeze  5x5 sec holds with TrA          Balance Exercises - 12/16/18 1228      Balance Exercises: Standing   Stepping Strategy  Anterior;Posterior;Lateral;UE support;5 reps          PT Short Term Goals - 11/27/18 1426      PT SHORT TERM GOAL #2   Title  Pt will report pain reduction of Rt lower quadrant and LE by at least 20%    Time  4    Period  Weeks    Status  On-going   Ant hip pain abolished, no change post RT back/hip.       PT Long Term Goals - 12/16/18 1217      PT LONG TERM GOAL #1   Title  Pt will be ind in advanced HEP and report compliance at least three days a week.    Status  Achieved      PT LONG TERM GOAL #2   Title  Pt will report at least 50% reduction in pain with daily activities around the house such as laundry and light housework.    Status  Achieved      PT LONG TERM GOAL #4   Title  Pt will  report improved stability evident by her report of no LOB from the start of therapy to decrease her risk of falling    Status  Achieved      PT LONG TERM GOAL #5   Title  Pt will achieve at least 4/5 strength rating of LE muscle groups and trunk to improve performance of daily tasks such as laundry and stairs to enter house.    Status  On-going            Plan - 12/16/18 1251    Clinical Impression Statement  Pt has been able to do laundry and kitchen bending/lifting tasks for cooking without pain and verbal report of using her stomach muscles to help.  She has intermittent pain in Rt hip which resolves quickly with laying down.  She displays improving strength and stability/balance in standing.  PT added stepping clock drill today which Pt was able to do without UE support. PT added to HEP.  Pt will be ready for d/c next visit.    Comorbidities  osteoporosis, scoliosis, fall risk    PT Frequency  2x / week    PT Duration  8 weeks    PT Treatment/Interventions  ADLs/Self Care Home Management;Aquatic Therapy;Cryotherapy;Electrical Stimulation;Iontophoresis 4mg /ml Dexamethasone;Moist Heat;Traction;Gait training;Stair training;Functional mobility training;Therapeutic activities;Therapeutic exercise;Balance training;Neuromuscular re-education;Patient/family education;Manual techniques;Passive range of motion;Dry needling;Spinal Manipulations;Joint Manipulations    PT Next Visit Plan  f/u on clock stepping drill, give handout, finalize HEP, reinforce body mechanics for bending/lifting/carrying    PT Home Exercise Plan  Access Code: [email or web address]    Consulted and Agree with Plan of Care  Patient       Patient will benefit from skilled therapeutic intervention in order to improve the following deficits and impairments:  Abnormal gait, Decreased range of motion, Difficulty walking, Increased fascial restricitons, Decreased endurance, Increased muscle spasms, Decreased activity tolerance, Pain, Decreased  balance, Impaired flexibility, Improper body mechanics, Decreased mobility, Decreased strength, Postural dysfunction  Visit Diagnosis: Chronic right-sided low  back pain with right-sided sciatica  Pain in right hip  Muscle weakness (generalized)  Abnormal posture     Problem List Patient Active Problem List   Diagnosis Date Noted  . LBBB (left bundle branch block) 03/30/2018  . Diverticulitis of intestine with perforation without abscess 03/30/2018  . Chest pain 03/10/2014  . Benign essential HTN 03/10/2014  . Abnormal EKG 03/10/2014  . GERD (gastroesophageal reflux disease) 07/07/2013  . Granulomatous lung disease ([place]) 07/07/2013  . Cough 06/13/2013    Baruch Merl, PT 12/16/18 12:55 PM   [facility] [address] [place], [place], [address] Phone: [phone]   Fax:  [phone]  Name: Susan Marks MRN: [id number] Date of Birth: 04-13-1925

## 2018-12-18 ENCOUNTER — Other Ambulatory Visit: Payer: Self-pay

## 2018-12-18 ENCOUNTER — Encounter: Payer: Self-pay | Admitting: Physical Therapy

## 2018-12-18 ENCOUNTER — Ambulatory Visit: Payer: PPO | Admitting: Physical Therapy

## 2018-12-18 DIAGNOSIS — M25551 Pain in right hip: Secondary | ICD-10-CM

## 2018-12-18 DIAGNOSIS — R2681 Unsteadiness on feet: Secondary | ICD-10-CM

## 2018-12-18 DIAGNOSIS — M5441 Lumbago with sciatica, right side: Secondary | ICD-10-CM

## 2018-12-18 DIAGNOSIS — M6281 Muscle weakness (generalized): Secondary | ICD-10-CM

## 2018-12-18 DIAGNOSIS — G8929 Other chronic pain: Secondary | ICD-10-CM

## 2018-12-18 DIAGNOSIS — R293 Abnormal posture: Secondary | ICD-10-CM

## 2018-12-18 NOTE — Therapy (Signed)
[facility] [address] [place], [place], [address] Phone: [phone]   Fax:  [phone]  Physical Therapy Treatment  Patient Details  Name: Susan Marks MRN: [id number] Date of Birth: December 13, 1925 Referring Provider (PT): Levy Pupa, [place]   Encounter Date: 12/18/2018  PT End of Session - 12/18/18 1032    Visit Number  14    Date for PT Re-Evaluation  12/23/18    Authorization Type  [employer]    PT Start Time  1032    PT Stop Time  1112    PT Time Calculation (min)  40 min    Activity Tolerance  Patient tolerated treatment well;Patient limited by fatigue;No increased pain    Behavior During Therapy  WFL for tasks assessed/performed       Past Medical History:  Diagnosis Date  . Chronic headaches    migraines  . Chronic kidney disease   . High blood pressure   . High cholesterol     Past Surgical History:  Procedure Laterality Date  . VESICOVAGINAL FISTULA CLOSURE W/ TAH      There were no vitals filed for this visit.  Subjective Assessment - 12/18/18 1032    Subjective  I told my husband yesterday "I feel almost normal"  I am ready to be discharged.  I use my stomach muscles and can do laundry and cooking without pain most days.  When I do have pain it doesn't last long, especially if I lay down.    Pertinent History  scoliosis, osteoporosis    Limitations  House hold activities    How long can you sit comfortably?  can't tell    How long can you stand comfortably?  30-45 min    Patient Stated Goals  reduce pain, learn how to do household tasks with less pain (laundry - front loading)    Currently in Pain?  No/denies         [facility] PT Assessment - 12/18/18 0001      Assessment   Medical Diagnosis  M48.061 (ICD-10-CM) - Spinal stenosis, lumbar region without neurogenic claudication    Referring Provider (PT)  Levy Pupa, PA-C    Hand Dominance  Right    Next MD Visit  no     Prior Therapy  yes                   [facility] Adult PT Treatment/Exercise - 12/18/18 0001      Exercises   Exercises  Lumbar;Knee/Hip;Shoulder      Lumbar Exercises: Stretches   Pelvic Tilt  15 reps    Piriformis Stretch  2 reps;30 seconds;Left;Right      Lumbar Exercises: Aerobic   Nustep  4' L2, PT present to review goals      Lumbar Exercises: Standing   Heel Raises  20 reps    Heel Raises Limitations  with toe raises      Knee/Hip Exercises: Standing   Heel Raises  Both;20 reps    Heel Raises Limitations  with toe raises    Hip Abduction  Stengthening;Knee straight;Both;5 reps    Hip Extension  Stengthening;1 set;10 reps;Both;Knee straight    Extension Limitations  standing on foam    Other Standing Knee Exercises  marching on foam pad x 20 reps, bil UE support edge of treadmill      Shoulder Exercises: Seated   Row  Strengthening;Theraband;10 reps   2 sets   Theraband Level (Shoulder Row)  Level 2 (Red)      Modalities   Modalities  Moist Heat      Moist Heat Therapy   Number Minutes Moist Heat  10 Minutes    Moist Heat Location  Lumbar Spine          Balance Exercises - 12/18/18 1054      Balance Exercises: Standing   Stepping Strategy  Anterior;Posterior;Lateral;UE support;5 reps   with weight shifts into stepping leg "stand tall through hip       PT Education - 12/18/18 1105    Education Details  Access Code: [email or web address]    Person(s) Educated  Patient    Methods  Explanation;Demonstration;Handout;Verbal cues    Comprehension  Verbalized understanding;Returned demonstration       PT Short Term Goals - 11/27/18 1426      PT SHORT TERM GOAL #2   Title  Pt will report pain reduction of Rt lower quadrant and LE by at least 20%    Time  4    Period  Weeks    Status  Achieved       PT Long Term Goals - 12/18/18 1102      PT LONG TERM GOAL #1   Title  Pt will be ind in advanced HEP and report compliance at least three days a week.     Status  Achieved      PT LONG TERM GOAL #2   Title  Pt will report at least 50% reduction in pain with daily activities around the house such as laundry and light housework.    Status  Achieved      PT LONG TERM GOAL #3   Title  Pt will improve 5x sit to stand to </= 15 sec with proper body mechanics and without need of UEs    Status  Achieved      PT LONG TERM GOAL #4   Title  Pt will report improved stability evident by her report of no LOB from the start of therapy to decrease her risk of falling    Status  Achieved      PT LONG TERM GOAL #5   Title  Pt will achieve at least 4/5 strength rating of LE muscle groups and trunk to improve performance of daily tasks such as laundry and stairs to enter house.    Status  Achieved            Plan - 12/18/18 1107    Clinical Impression Statement  Pt has made signif progress in strength, balance, and core awareness.  She can tolerate all household activities for up to 1 hour at a time without pain.  She has tools to use via stretches and ROM exercises to help with pain.  She states pain occurs much less frequently and doesn't last long if it comes on.  She is compliant with HEP.  She has met all LTGs.  She is ready to d/c to HEP with return to PT as needed.    Personal Factors and Comorbidities  Age;Comorbidity 1;Comorbidity 2;Time since onset of injury/illness/exacerbation;Comorbidity 3+    Comorbidities  osteoporosis, scoliosis, fall risk    Examination-Activity Limitations  Bend;Sleep;Carry;Squat;Stand;Locomotion Level;Lift    Examination-Participation Restrictions  Laundry;Volunteer;Driving;Community Activity    Rehab Potential  Good    PT Frequency  2x / week    PT Duration  8 weeks    PT Treatment/Interventions  ADLs/Self Care Home Management;Aquatic Therapy;Cryotherapy;Electrical Stimulation;Iontophoresis 79m/ml Dexamethasone;Moist Heat;Traction;Gait training;Stair training;Functional mobility training;Therapeutic  activities;Therapeutic  exercise;Balance training;Neuromuscular re-education;Patient/family education;Manual techniques;Passive range of motion;Dry needling;Spinal Manipulations;Joint Manipulations    PT Next Visit Plan  d/c to HEP with f/u as needed    PT Home Exercise Plan  Access Code: [email or web address]    Consulted and Agree with Plan of Care  Patient       Patient will benefit from skilled therapeutic intervention in order to improve the following deficits and impairments:  Abnormal gait, Decreased range of motion, Difficulty walking, Increased fascial restricitons, Decreased endurance, Increased muscle spasms, Decreased activity tolerance, Pain, Decreased balance, Impaired flexibility, Improper body mechanics, Decreased mobility, Decreased strength, Postural dysfunction  Visit Diagnosis: Chronic right-sided low back pain with right-sided sciatica  Pain in right hip  Muscle weakness (generalized)  Abnormal posture  Unsteadiness on feet     Problem List Patient Active Problem List   Diagnosis Date Noted  . LBBB (left bundle branch block) 03/30/2018  . Diverticulitis of intestine with perforation without abscess 03/30/2018  . Chest pain 03/10/2014  . Benign essential HTN 03/10/2014  . Abnormal EKG 03/10/2014  . GERD (gastroesophageal reflux disease) 07/07/2013  . Granulomatous lung disease ([place]) 07/07/2013  . Cough 06/13/2013    PHYSICAL THERAPY DISCHARGE SUMMARY  Visits from Start of Care: 14  Current functional level related to goals / functional outcomes: See above   Remaining deficits: See above   Education / Equipment: HEP Plan: Patient agrees to discharge.  Patient goals were met. Patient is being discharged due to meeting the stated rehab goals.  ?????          Baruch Merl, PT 12/18/18 12:25 PM   [facility] [address], [place], [place], [address] Phone: [phone]   Fax:   [phone]  Name: Susan Marks MRN: [id number] Date of Birth: 13-Feb-1926

## 2018-12-18 NOTE — Patient Instructions (Signed)
Access Code: [email or web address]  URL: [email or web address]  Date: 12/18/2018  Prepared by: Venetia Night Cassady Stanczak   Exercises  Supine Posterior Pelvic Tilt - 10 reps - 2 sets - 5 hold - 1x daily - 7x weekly  Supine Piriformis Stretch - 10 reps - 3 sets - 30 hold - 1x daily - 7x weekly  Hooklying Single Knee to Chest Stretch - 10 reps - 2 sets - 5 hold - 1x daily - 7x weekly  Bridge - 10 reps - 2 sets - 1x daily - 7x weekly  Hooklying Isometric Hip Flexion - 10 reps - 2 sets - 5 hold - 1x daily - 7x weekly  Supine Isometric Shoulder Extension with Towel - 10 reps - 2 sets - 5 hold - 1x daily - 7x weekly  Hip Flexor Stretch on Step - 10 reps - 1 sets - 5 hold - 1x daily - 7x weekly  Heel Toe Raises with Counter Support - 20 reps - 1 sets - 1x daily - 7x weekly  Side to Side Weight Shift with Counter Support - 20 reps - 1 sets - 1x daily - 7x weekly  Standing March with Unilateral Counter Support - 20 reps - 1 sets - 1x daily - 7x weekly  Standing Hip Abduction with Counter Support - 5 reps - 2 sets - 1x daily - 7x weekly  Standing Hip Extension with Counter Support - 5 reps - 2 sets - 1x daily - 7x weekly  Seated Sidebending Arms Overhead - 3 reps - 1 sets - 10 hold - 1x daily - 7x weekly  Single Leg Balance with Clock Reach - 5 reps - 2 sets - 1x daily - 7x weekly

## 2019-01-10 ENCOUNTER — Ambulatory Visit: Payer: PPO | Admitting: Cardiology

## 2019-01-10 ENCOUNTER — Other Ambulatory Visit: Payer: Self-pay

## 2019-01-10 ENCOUNTER — Encounter: Payer: Self-pay | Admitting: Cardiology

## 2019-01-10 VITALS — BP 144/74 | HR 53 | Ht 59.0 in | Wt 125.0 lb

## 2019-01-10 DIAGNOSIS — R079 Chest pain, unspecified: Secondary | ICD-10-CM | POA: Diagnosis not present

## 2019-01-10 DIAGNOSIS — I10 Essential (primary) hypertension: Secondary | ICD-10-CM

## 2019-01-10 DIAGNOSIS — E7800 Pure hypercholesterolemia, unspecified: Secondary | ICD-10-CM

## 2019-01-10 DIAGNOSIS — I447 Left bundle-branch block, unspecified: Secondary | ICD-10-CM | POA: Diagnosis not present

## 2019-01-10 NOTE — Progress Notes (Signed)
Cardiology Office Note:    Date:  01/10/2019   ID:  Susan Marks, DOB 1925-04-03, MRN [id number]  PCP:  Susan Jordan, MD  Cardiologist:  Susan Furbish, MD    Referring MD: Susan Jordan, MD     History of Present Illness:    Susan Marks is a 85 y.o. female with hypertension, hyperlipidemia, anemia who have seen in the distant past greater than 3 years ago here with follow-up for bundle branch block, PVCs, prior chest pain.  In review of prior office note by Dr. Radford Marks on 03/10/14, she was having chest discomfort with exertion when walking the dog but very brief. She underwent nuclear stress test on 03/19/14 and everything was fine.  Today she states that she's had a couple episodes of chest discomfort sometimes described as a 6/10 at rest, left upper chest sometimes feels as though it goes to her back, lasting a few minutes duration. She had one episode when walking across [employer] in the cold. These are usually brief and rare episodes. No other associated symptoms.  Occasional cramping in her legs. Good overall pulses.  Her sister who is 47 years younger than her had bypass surgery.  01/31/17-she comes in today with a couple stories of atypical chest discomfort sometimes with exertion when walking through a parking lot for instance.  The seem to be relieved with rest.  No associated shortness of breath or diaphoresis.  She also stated that as she was walking through the parking lot outside of her building she fell.  To gentleman helped her up.  She was able to ambulate to our office without difficulty but she did sustain a fall onto her right shoulder and her right knee.  Please see below for evaluation.  01/10/19 - here for CP follow up. Lost weight which she is happy about. Had diverticulitis.  Overall no real situations with worrisome chest pain she states.  Has some back discomfort, occasional leg discomfort.  Pulses are normal distally.  No fevers chills  nausea vomiting syncope bleeding  Past Medical History:  Diagnosis Date  . Chronic headaches    migraines  . Chronic kidney disease   . High blood pressure   . High cholesterol     Past Surgical History:  Procedure Laterality Date  . VESICOVAGINAL FISTULA CLOSURE W/ TAH      Current Medications: Current Meds  Medication Sig  . aspirin 81 MG tablet Take 81 mg by mouth daily.  . Calcium-Vitamin D-Vitamin K (VIACTIV PO) Take 2 capsules by mouth daily.  . cholecalciferol (VITAMIN D) 400 units TABS tablet Take 400 Units by mouth.  . cyanocobalamin 2000 MCG tablet Take 2,000 mcg by mouth daily.  Marland Kitchen denosumab (PROLIA) 60 MG/ML SOLN injection Inject 60 mg into the skin every 6 (six) months. Administer in upper arm, thigh, or abdomen  . famotidine (PEPCID) 40 MG tablet Take 40 mg by mouth daily.  . fenofibrate micronized (LOFIBRA) 200 MG capsule Take 200 mg by mouth at bedtime.  Marland Kitchen ibuprofen (ADVIL,MOTRIN) 200 MG tablet Take 400 mg by mouth as needed for moderate pain.   Marland Kitchen losartan (COZAAR) 100 MG tablet TAKE 1 TABLET BY MOUTH EVERY DAY  . polyethylene glycol (MIRALAX / GLYCOLAX) packet Take 17 g by mouth daily as needed for mild constipation.      Allergies:   Sulfa antibiotics, Lisinopril, Other, and Statins   Social History   Socioeconomic History  . Marital status: Married    Spouse name:  Not on file  . Number of children: 2  . Years of education: Not on file  . Highest education level: Not on file  Occupational History  . Occupation: Firefighter  Social Needs  . Financial resource strain: Not on file  . Food insecurity    Worry: Not on file    Inability: Not on file  . Transportation needs    Medical: Not on file    Non-medical: Not on file  Tobacco Use  . Smoking status: Never Smoker  . Smokeless tobacco: Never Used  Substance and Sexual Activity  . Alcohol use: Yes    Comment: occasional wine(red)  . Drug use: No  . Sexual activity: Not on file  Lifestyle   . Physical activity    Days per week: Not on file    Minutes per session: Not on file  . Stress: Not on file  Relationships  . Social Herbalist on phone: Not on file    Gets together: Not on file    Attends religious service: Not on file    Active member of club or organization: Not on file    Attends meetings of clubs or organizations: Not on file    Relationship status: Not on file  Other Topics Concern  . Not on file  Social History Narrative  . Not on file     Family History: The patient's family history includes Allergies in her sister; Glaucoma in her sister; Heart disease in her father; Heart murmur in her sister. ROS:   Please see the history of present illness.     All other systems reviewed and are negative.  EKGs/Labs/Other Studies Reviewed:    The following studies were reviewed today: Prior office notes, lab work, stress test reviewed. Stress test normal.  Echocardiogram 03/11/14:  - Left ventricle: Abnormal septal motion. The cavity size was normal. Systolic function was normal. The estimated ejection fraction was 55%. Wall motion was normal; there were no regional wall motion abnormalities. Doppler parameters are consistent with abnormal left ventricular relaxation (grade 1 diastolic dysfunction). - Aortic valve: There was trivial regurgitation. - Mitral valve: There was mild regurgitation. - Left atrium: The atrium was mildly dilated. - Atrial septum: No defect or patent foramen ovale was identified.  EKG:  EKG is  ordered today.  The ekg ordered today demonstrates 01/10/2019-sinus bradycardia first-degree AV block left bundle branch block 08/15/16-sinus bradycardia first-degree AV block 212 ms with heart rate of 50 bpm with left bundle branch block. Prior EKG demonstrated left bundle branch block.  Recent Labs: 03/31/2018: ALT 19; Magnesium 2.0; TSH 2.251 04/02/2018: Hemoglobin 10.8; Platelets 266 04/03/2018: BUN 17; Creatinine, Ser  1.01; Potassium 3.9; Sodium 137   Recent Lipid Panel No results found for: CHOL, TRIG, HDL, CHOLHDL, VLDL, LDLCALC, LDLDIRECT  Physical Exam:    VS:  BP (!) 144/74   Pulse (!) 53   Ht 4\' 11"  (1.499 m)   Wt 125 lb (56.7 kg)   BMI 25.25 kg/m     Wt Readings from Last 3 Encounters:  01/10/19 125 lb (56.7 kg)  03/30/18 126 lb (57.2 kg)  02/05/17 134 lb (60.8 kg)     GEN: Well nourished, well developed, in no acute distress  HEENT: normal  Neck: no JVD, carotid bruits, or masses Cardiac: RRR; no murmurs, rubs, or gallops,no edema  Respiratory:  clear to auscultation bilaterally, normal work of breathing GI: soft, nontender, nondistended, + BS MS: no deformity or atrophy  Skin:  warm and dry, no rash Neuro:  Alert and Oriented x 3, Strength and sensation are intact Psych: euthymic mood, full affect    ASSESSMENT:    1. Left bundle branch block   2. Essential hypertension   3. Chest pain, unspecified type   4. Pure hypercholesterolemia    PLAN:    In order of problems listed above:  Atypical chest pain  -No real complaints today.  Prior stress test has been reassuring.  Continue to monitor.  Left bundle branch block  - Chronic.  No high risk symptoms such as syncope.  - Echocardiogram with normal EF.  Mitral regurgitation has been trivial.  Essential hypertension  - Medications reviewed. Managed closely by Dr. Stephanie Acre.  Doing well on current regimen.  Hyperlipidemia  - Has been on fenofibrate.  No side effects.   Medication Adjustments/Labs and Tests Ordered: Current medicines are reviewed at length with the patient today.  Concerns regarding medicines are outlined above. Labs and tests ordered and medication changes are outlined in the patient instructions below:  Patient Instructions  Medication Instructions:  The current medical regimen is effective;  continue present plan and medications.  *If you need a refill on your cardiac medications before your next  appointment, please call your pharmacy*  Follow-Up: At [facility], you and your health needs are our priority.  As part of our continuing mission to provide you with exceptional heart care, we have created designated Provider Care Teams.  These Care Teams include your primary Cardiologist (physician) and Advanced Practice Providers (APPs -  Physician Assistants and Nurse Practitioners) who all work together to provide you with the care you need, when you need it.  Your next appointment:   12 months  The format for your next appointment:   In Person  Provider:   You may see Dr Susan Marks. or one of the following Advanced Practice Providers on your designated Care Team:    Truitt Merle, NP  Cecilie Kicks, NP  Kathyrn Drown, NP   Thank you for choosing [facility]!!         Signed, Susan Furbish, MD  01/10/2019 2:47 PM    [place]

## 2019-01-10 NOTE — Patient Instructions (Signed)
Medication Instructions:  The current medical regimen is effective;  continue present plan and medications.  *If you need a refill on your cardiac medications before your next appointment, please call your pharmacy*  Follow-Up: At [facility], you and your health needs are our priority.  As part of our continuing mission to provide you with exceptional heart care, we have created designated Provider Care Teams.  These Care Teams include your primary Cardiologist (physician) and Advanced Practice Providers (APPs -  Physician Assistants and Nurse Practitioners) who all work together to provide you with the care you need, when you need it.  Your next appointment:   12 months  The format for your next appointment:   In Person  Provider:   You may see Dr Candee Furbish. or one of the following Advanced Practice Providers on your designated Care Team:    Truitt Merle, NP  Cecilie Kicks, NP  Kathyrn Drown, NP   Thank you for choosing [facility]!!

## 2019-04-25 DIAGNOSIS — I447 Left bundle-branch block, unspecified: Secondary | ICD-10-CM | POA: Diagnosis not present

## 2019-04-25 DIAGNOSIS — Z79899 Other long term (current) drug therapy: Secondary | ICD-10-CM | POA: Diagnosis not present

## 2019-04-25 DIAGNOSIS — K219 Gastro-esophageal reflux disease without esophagitis: Secondary | ICD-10-CM | POA: Diagnosis not present

## 2019-04-25 DIAGNOSIS — E559 Vitamin D deficiency, unspecified: Secondary | ICD-10-CM | POA: Diagnosis not present

## 2019-04-25 DIAGNOSIS — Z0000 Encounter for general adult medical examination without abnormal findings: Secondary | ICD-10-CM | POA: Diagnosis not present

## 2019-04-25 DIAGNOSIS — E538 Deficiency of other specified B group vitamins: Secondary | ICD-10-CM | POA: Diagnosis not present

## 2019-04-25 DIAGNOSIS — R7301 Impaired fasting glucose: Secondary | ICD-10-CM | POA: Diagnosis not present

## 2019-04-25 DIAGNOSIS — I10 Essential (primary) hypertension: Secondary | ICD-10-CM | POA: Diagnosis not present

## 2019-04-25 DIAGNOSIS — N1831 Chronic kidney disease, stage 3a: Secondary | ICD-10-CM | POA: Diagnosis not present

## 2019-04-25 DIAGNOSIS — E039 Hypothyroidism, unspecified: Secondary | ICD-10-CM | POA: Diagnosis not present

## 2019-04-25 DIAGNOSIS — M810 Age-related osteoporosis without current pathological fracture: Secondary | ICD-10-CM | POA: Diagnosis not present

## 2019-04-25 DIAGNOSIS — E7800 Pure hypercholesterolemia, unspecified: Secondary | ICD-10-CM | POA: Diagnosis not present

## 2019-05-13 DIAGNOSIS — M810 Age-related osteoporosis without current pathological fracture: Secondary | ICD-10-CM | POA: Diagnosis not present

## 2019-06-12 DIAGNOSIS — M810 Age-related osteoporosis without current pathological fracture: Secondary | ICD-10-CM | POA: Diagnosis not present

## 2019-06-12 DIAGNOSIS — M545 Low back pain: Secondary | ICD-10-CM | POA: Diagnosis not present

## 2019-06-12 DIAGNOSIS — Z6824 Body mass index (BMI) 24.0-24.9, adult: Secondary | ICD-10-CM | POA: Diagnosis not present

## 2019-06-12 DIAGNOSIS — M150 Primary generalized (osteo)arthritis: Secondary | ICD-10-CM | POA: Diagnosis not present

## 2019-06-24 DIAGNOSIS — H26491 Other secondary cataract, right eye: Secondary | ICD-10-CM | POA: Diagnosis not present

## 2019-06-24 DIAGNOSIS — H524 Presbyopia: Secondary | ICD-10-CM | POA: Diagnosis not present

## 2019-06-24 DIAGNOSIS — H35362 Drusen (degenerative) of macula, left eye: Secondary | ICD-10-CM | POA: Diagnosis not present

## 2019-06-24 DIAGNOSIS — Z961 Presence of intraocular lens: Secondary | ICD-10-CM | POA: Diagnosis not present

## 2019-06-24 DIAGNOSIS — H04123 Dry eye syndrome of bilateral lacrimal glands: Secondary | ICD-10-CM | POA: Diagnosis not present

## 2019-06-27 DIAGNOSIS — M1612 Unilateral primary osteoarthritis, left hip: Secondary | ICD-10-CM | POA: Diagnosis not present

## 2019-06-27 DIAGNOSIS — B001 Herpesviral vesicular dermatitis: Secondary | ICD-10-CM | POA: Diagnosis not present

## 2019-07-23 DIAGNOSIS — N1831 Chronic kidney disease, stage 3a: Secondary | ICD-10-CM | POA: Diagnosis not present

## 2019-07-23 DIAGNOSIS — E039 Hypothyroidism, unspecified: Secondary | ICD-10-CM | POA: Diagnosis not present

## 2019-07-23 DIAGNOSIS — Z8619 Personal history of other infectious and parasitic diseases: Secondary | ICD-10-CM | POA: Diagnosis not present

## 2019-08-19 DIAGNOSIS — N183 Chronic kidney disease, stage 3 unspecified: Secondary | ICD-10-CM | POA: Diagnosis not present

## 2019-10-06 DIAGNOSIS — M542 Cervicalgia: Secondary | ICD-10-CM | POA: Diagnosis not present

## 2019-10-06 DIAGNOSIS — R269 Unspecified abnormalities of gait and mobility: Secondary | ICD-10-CM | POA: Diagnosis not present

## 2019-10-06 DIAGNOSIS — M545 Low back pain: Secondary | ICD-10-CM | POA: Diagnosis not present

## 2019-10-06 DIAGNOSIS — R52 Pain, unspecified: Secondary | ICD-10-CM | POA: Diagnosis not present

## 2019-10-15 ENCOUNTER — Telehealth: Payer: Self-pay | Admitting: Cardiology

## 2019-10-15 DIAGNOSIS — Y92009 Unspecified place in unspecified non-institutional (private) residence as the place of occurrence of the external cause: Secondary | ICD-10-CM

## 2019-10-15 DIAGNOSIS — R55 Syncope and collapse: Secondary | ICD-10-CM

## 2019-10-15 NOTE — Telephone Encounter (Signed)
Spoke with patient who is concerned because she noticed the heart beat light pulsing while taking her BP.  She does not remember seeing that before. 168/75 HR 56, 175/78 HR 65.  The heart beat does not seem to be irregular but is not usually in the 50's.   She reports at times feeling tired and sluggish in the early afternoons and feeling as tough she needs to take a nap.  She also has had some dizziness from time to time with position changes.  She takes Losartan 100 mg daily.  She has a HX of back and spinal problems, usually uses a cane.  Saturday a week ago she fell in her kitchen.  She does not remember what caused her to fall, she does not remember feeling lightheaded or dizzy.  She denies tripping on anything.  She reports her right leg is usually weak and she is generally not steady on her feet d/t her back issues but it is not usual for her to fall.  She denies any other s/s. Reassurance given regarding the light on her BP monitor.  Advised to have the batteries changed since she doesn't know when they were changed the last time.  advised her to continue to use caution with position changes and to continue to monitor her HR/BP.  She will c/b if further problems.  She generally only sees her PCP once a year and is not due for a visit any time soon.  Will forward to Dr Marlou Porch for his knowledge and any new orders.

## 2019-10-15 NOTE — Telephone Encounter (Signed)
  Pt c/o BP issue: STAT if pt c/o blurred vision, one-sided weakness or slurred speech  1. What are your last 5 BP readings?   186/75 p 56 175/78 p 55 176/75 p 57  2. Are you having any other symptoms (ex. Dizziness, headache, blurred vision, passed out)? When she goes to lay down from sitting position she gets dizzy. She states she feels tired and lethargic today.   3. What is your BP issue? States that when she took her BP today the heart was blinking and the box told her to call her doctor.

## 2019-10-16 ENCOUNTER — Encounter: Payer: Self-pay | Admitting: *Deleted

## 2019-10-16 NOTE — Telephone Encounter (Signed)
Pt aware of order to wear ZIO patch for 14 days.  Aware it will be mailed to her home address (which was verified).  Aware it will come with instructions and a pre-paid mailer to return it.  Pt expressed gratitude for the follow up and for having Dr Marlou Porch to review her s/s.   If she develops more she will contact the office.

## 2019-10-16 NOTE — Telephone Encounter (Signed)
Please check a Zio patch monitor 14-day-possible syncope/syncope.  Recent fall unexplained Candee Furbish, MD

## 2019-10-16 NOTE — Progress Notes (Signed)
Patient ID: Susan Marks, female   DOB: 14-Oct-1925, 86 y.o.   MRN: [id number] Patient enrolled for 14 day ZIO XT long term holter monitor to be shipped to her home.

## 2019-10-21 ENCOUNTER — Telehealth: Payer: Self-pay | Admitting: Cardiology

## 2019-10-21 ENCOUNTER — Ambulatory Visit (INDEPENDENT_AMBULATORY_CARE_PROVIDER_SITE_OTHER): Payer: PPO

## 2019-10-21 DIAGNOSIS — R55 Syncope and collapse: Secondary | ICD-10-CM | POA: Diagnosis not present

## 2019-10-21 DIAGNOSIS — W19XXXA Unspecified fall, initial encounter: Secondary | ICD-10-CM

## 2019-10-21 NOTE — Telephone Encounter (Signed)
Patient and husband read instructions and applied monitor while on phone with [employer].

## 2019-10-21 NOTE — Telephone Encounter (Signed)
Patient would like to know if a Nurse would be able to help her put on her Xio monitor. She does not think she will be able to put it on correctly herself.

## 2019-11-07 DIAGNOSIS — R55 Syncope and collapse: Secondary | ICD-10-CM | POA: Diagnosis not present

## 2019-11-11 DIAGNOSIS — M810 Age-related osteoporosis without current pathological fracture: Secondary | ICD-10-CM | POA: Diagnosis not present

## 2019-12-01 DIAGNOSIS — M545 Low back pain: Secondary | ICD-10-CM | POA: Diagnosis not present

## 2020-05-04 DIAGNOSIS — E7800 Pure hypercholesterolemia, unspecified: Secondary | ICD-10-CM | POA: Diagnosis not present

## 2020-05-04 DIAGNOSIS — Z79899 Other long term (current) drug therapy: Secondary | ICD-10-CM | POA: Diagnosis not present

## 2020-05-04 DIAGNOSIS — E538 Deficiency of other specified B group vitamins: Secondary | ICD-10-CM | POA: Diagnosis not present

## 2020-05-04 DIAGNOSIS — R7301 Impaired fasting glucose: Secondary | ICD-10-CM | POA: Diagnosis not present

## 2020-05-04 DIAGNOSIS — E039 Hypothyroidism, unspecified: Secondary | ICD-10-CM | POA: Diagnosis not present

## 2020-05-04 DIAGNOSIS — E559 Vitamin D deficiency, unspecified: Secondary | ICD-10-CM | POA: Diagnosis not present

## 2020-05-05 DIAGNOSIS — K219 Gastro-esophageal reflux disease without esophagitis: Secondary | ICD-10-CM | POA: Diagnosis not present

## 2020-05-05 DIAGNOSIS — E7800 Pure hypercholesterolemia, unspecified: Secondary | ICD-10-CM | POA: Diagnosis not present

## 2020-05-05 DIAGNOSIS — Z0000 Encounter for general adult medical examination without abnormal findings: Secondary | ICD-10-CM | POA: Diagnosis not present

## 2020-05-05 DIAGNOSIS — E039 Hypothyroidism, unspecified: Secondary | ICD-10-CM | POA: Diagnosis not present

## 2020-05-05 DIAGNOSIS — R0982 Postnasal drip: Secondary | ICD-10-CM | POA: Diagnosis not present

## 2020-05-05 DIAGNOSIS — E538 Deficiency of other specified B group vitamins: Secondary | ICD-10-CM | POA: Diagnosis not present

## 2020-05-05 DIAGNOSIS — Z79899 Other long term (current) drug therapy: Secondary | ICD-10-CM | POA: Diagnosis not present

## 2020-05-05 DIAGNOSIS — I10 Essential (primary) hypertension: Secondary | ICD-10-CM | POA: Diagnosis not present

## 2020-05-05 DIAGNOSIS — E559 Vitamin D deficiency, unspecified: Secondary | ICD-10-CM | POA: Diagnosis not present

## 2020-05-05 DIAGNOSIS — R7301 Impaired fasting glucose: Secondary | ICD-10-CM | POA: Diagnosis not present

## 2020-05-17 DIAGNOSIS — M810 Age-related osteoporosis without current pathological fracture: Secondary | ICD-10-CM | POA: Diagnosis not present

## 2020-06-14 DIAGNOSIS — M150 Primary generalized (osteo)arthritis: Secondary | ICD-10-CM | POA: Diagnosis not present

## 2020-06-14 DIAGNOSIS — M25561 Pain in right knee: Secondary | ICD-10-CM | POA: Diagnosis not present

## 2020-06-14 DIAGNOSIS — Z6824 Body mass index (BMI) 24.0-24.9, adult: Secondary | ICD-10-CM | POA: Diagnosis not present

## 2020-06-14 DIAGNOSIS — M810 Age-related osteoporosis without current pathological fracture: Secondary | ICD-10-CM | POA: Diagnosis not present

## 2020-06-23 DIAGNOSIS — M8589 Other specified disorders of bone density and structure, multiple sites: Secondary | ICD-10-CM | POA: Diagnosis not present

## 2020-06-23 DIAGNOSIS — Z780 Asymptomatic menopausal state: Secondary | ICD-10-CM | POA: Diagnosis not present

## 2020-06-23 DIAGNOSIS — M810 Age-related osteoporosis without current pathological fracture: Secondary | ICD-10-CM | POA: Diagnosis not present

## 2020-10-20 DIAGNOSIS — E039 Hypothyroidism, unspecified: Secondary | ICD-10-CM | POA: Diagnosis not present

## 2020-10-20 DIAGNOSIS — E559 Vitamin D deficiency, unspecified: Secondary | ICD-10-CM | POA: Diagnosis not present

## 2020-11-15 DIAGNOSIS — M810 Age-related osteoporosis without current pathological fracture: Secondary | ICD-10-CM | POA: Diagnosis not present

## 2021-04-05 DIAGNOSIS — M545 Low back pain, unspecified: Secondary | ICD-10-CM | POA: Diagnosis not present

## 2021-05-06 DIAGNOSIS — M25551 Pain in right hip: Secondary | ICD-10-CM | POA: Diagnosis not present

## 2021-05-06 DIAGNOSIS — N1831 Chronic kidney disease, stage 3a: Secondary | ICD-10-CM | POA: Diagnosis not present

## 2021-05-06 DIAGNOSIS — R296 Repeated falls: Secondary | ICD-10-CM | POA: Diagnosis not present

## 2021-05-18 DIAGNOSIS — M810 Age-related osteoporosis without current pathological fracture: Secondary | ICD-10-CM | POA: Diagnosis not present

## 2021-05-25 DIAGNOSIS — M25551 Pain in right hip: Secondary | ICD-10-CM | POA: Diagnosis not present

## 2021-05-25 DIAGNOSIS — R296 Repeated falls: Secondary | ICD-10-CM | POA: Diagnosis not present

## 2021-05-26 ENCOUNTER — Other Ambulatory Visit: Payer: Self-pay

## 2021-05-26 ENCOUNTER — Ambulatory Visit: Payer: PPO | Attending: Family Medicine | Admitting: Physical Therapy

## 2021-05-26 ENCOUNTER — Encounter: Payer: Self-pay | Admitting: Physical Therapy

## 2021-05-26 DIAGNOSIS — M25551 Pain in right hip: Secondary | ICD-10-CM | POA: Insufficient documentation

## 2021-05-26 DIAGNOSIS — R293 Abnormal posture: Secondary | ICD-10-CM | POA: Diagnosis not present

## 2021-05-26 DIAGNOSIS — G8929 Other chronic pain: Secondary | ICD-10-CM | POA: Diagnosis not present

## 2021-05-26 DIAGNOSIS — M25552 Pain in left hip: Secondary | ICD-10-CM | POA: Insufficient documentation

## 2021-05-26 DIAGNOSIS — M545 Low back pain, unspecified: Secondary | ICD-10-CM

## 2021-05-26 DIAGNOSIS — M6281 Muscle weakness (generalized): Secondary | ICD-10-CM | POA: Diagnosis not present

## 2021-05-26 NOTE — Therapy (Signed)
OUTPATIENT PHYSICAL THERAPY THORACOLUMBAR EVALUATION   Patient Name: Susan Marks MRN: [id number] DOB:10/11/1925, 88 y.o., female Today's Date: 05/26/2021   PT End of Session - 05/26/21 1411     Visit Number 1    Date for PT Re-Evaluation 07/21/21    Authorization Type [employer]    PT Start Time 1415    PT Stop Time 1510    PT Time Calculation (min) 55 min    Activity Tolerance Patient tolerated treatment well    Behavior During Therapy WFL for tasks assessed/performed             Past Medical History:  Diagnosis Date   Chronic headaches    migraines   Chronic kidney disease    High blood pressure    High cholesterol    Past Surgical History:  Procedure Laterality Date   VESICOVAGINAL FISTULA CLOSURE W/ TAH     Patient Active Problem List   Diagnosis Date Noted   LBBB (left bundle branch block) 03/30/2018   Diverticulitis of intestine with perforation without abscess 03/30/2018   Chest pain 03/10/2014   Benign essential HTN 03/10/2014   Abnormal EKG 03/10/2014   GERD (gastroesophageal reflux disease) 07/07/2013   Granulomatous lung disease ([place]) 07/07/2013   Cough 06/13/2013    PCP: Jonathon Jordan, MD  REFERRING PROVIDER: Jonathon Jordan, MD  REFERRING DIAG: M16.0 (ICD-10-CM) - Bilateral primary osteoarthritis of hip R29.6 (ICD-10-CM) - Repeated falls M47.816 (ICD-10-CM) - Spondylosis without myelopathy or radiculopathy, lumbar region   THERAPY DIAG:  Chronic right-sided low back pain, unspecified whether sciatica present  Pain in right hip  Pain in left hip  Muscle weakness (generalized)  Abnormal posture  ONSET DATE: chronic  SUBJECTIVE:                                                                                                                                                                                           SUBJECTIVE STATEMENT: Pt is a returning Pt referred to OPPT for bil hip pain, Rt sided LBP and falls.  She has  Rt hip bursitis and hip pain is sometimes worse than back pain.  I tried injections twice but they didn't do me any good.  Pain sometimes extends down thigh, into knee and leg.  I have fallen twice in my kitchen with tile floors, most recently about a month ago.  Currently uses a 3-wheeled rollator both in home and community.  PERTINENT HISTORY:  Falls, osteoporosis, scoliosis, OA of bil hips and lumbar spine  PAIN:  Are you having pain? No NPRS scale: 0-10/10 Pain location: Rt low back, buttock, lateral hip into thigh  and leg Pain orientation: Right  PAIN TYPE: aching, dull, and throbbing Pain description: intermittent  Aggravating factors: bending (I have a reacher and try not to bend) Relieving factors: lying down, pain patches  PRECAUTIONS: Fall  WEIGHT BEARING RESTRICTIONS No  FALLS:  Has patient fallen in last 6 months? Yes, Number of falls: 1  LIVING ENVIRONMENT: Lives with: lives with their spouse Lives in: House/apartment Stairs: Yes; External: 3 steps; can reach both Has following equipment at home:  3-wheeled rollator, has had this for 2-3 years , grab bars  OCCUPATION: retired  PLOF: Independent  PATIENT GOALS  Have less pain   OBJECTIVE:   DIAGNOSTIC FINDINGS:  Scoliosis, signif OA and DDD lumbar spine, anterolisthesis L4 on L5, OA bil hips  PATIENT SURVEYS:  FOTO 40%, goal 50%   COGNITION:  Overall cognitive status: Within functional limits for tasks assessed      MUSCLE LENGTH: Hamstrings: Right 65 deg; Left 65 deg Limited piriformis bil 30% Limited adductors Lt>Rt  POSTURE:  Excessive thoracic kyphosis, scoliosis (mild)  PALPATION: Signif tenderness bil greater trochanter and glut tendons, Rt SI joint  LUMBARAROM/PROM  A/PROM A/PROM  05/26/2021  Flexion Limited 30%  Extension Not tested  Right lateral flexion Limited 50%, pain  Left lateral flexion Limited 50%, pain  Right rotation Limited 75%  Left rotation Limited 75%   (Blank rows  = not tested)  LE AROM/PROM: Lt hip ER 6 deg Rt hip ER 20 deg Bil hip flexion 95 deg  LE MMT: Bil hips 4-/5 Bil knees 4/5 Ankle DF 5/5   FUNCTIONAL TESTS:  5 times sit to stand: 17 Timed up and go (TUG): 15  with 3-wheeled rollator  GAIT: Distance walked: n/a Assistive device utilized:  3-wheeled rollator Level of assistance: Complete Independence Comments: appears in good control with 3-wheeled rollator    TODAY'S TREATMENT  Discussion about pros and cons of 3-wheel and 4-wheel rollator, info given to call [employer] store to go check out a 4-wheeled rollator for improved stability (Pt concerned about weight for lifting it) Seated hamstring stretch bil Supine piriformis stretch bil Sit to stand    PATIENT EDUCATION:  Education details: Access Code: [email or web address] Person educated: Patient Education method: Explanation, Demonstration, Tactile cues, Verbal cues, and Handouts Education comprehension: verbalized understanding, returned demonstration, and verbal cues required   HOME EXERCISE PROGRAM: Access Code: [email or web address] URL: [email or web address] Date: 05/26/2021 Prepared by: Venetia Night Kayan Blissett  Exercises Sit to Stand with Armchair - 3 x daily - 7 x weekly - 1 sets - 5 reps Seated Hamstring Stretch - 3 x daily - 7 x weekly - 1 sets - 1 reps - 20 hold Supine Piriformis Stretch - 2 x daily - 7 x weekly - 3 sets - 10 reps   ASSESSMENT:  CLINICAL IMPRESSION: Patient is a 88 y.o. female who was seen today for physical therapy evaluation and treatment for falls, hip pain and LBP.    OBJECTIVE IMPAIRMENTS decreased balance, decreased ROM, decreased strength, hypomobility, impaired flexibility, improper body mechanics, postural dysfunction, and pain.   ACTIVITY LIMITATIONS cleaning, community activity, driving, meal prep, laundry, and shopping.   PERSONAL FACTORS Age and Time since onset of injury/illness/exacerbation are also affecting patient's  functional outcome.    REHAB POTENTIAL: Good  CLINICAL DECISION MAKING: Stable/uncomplicated  EVALUATION COMPLEXITY: Low   GOALS: Goals reviewed with patient? Yes  SHORT TERM GOALS:  Pt will be ind with initial HEP without exacerbation of symptoms Baseline:  Target date: 06/23/2021 Goal status:  INITIAL  2.  Pt will demo safety with 3-wheeled rollator with 3' walk test navigating around obstacles in clinic. Baseline:  Target date: 06/23/2021 Goal status: INITIAL  3.  Pt will look into 4-wheeled rollator to see if she feels more stable and can lift it into and from car Baseline:  Target date: 06/23/2021 Goal status: INITIAL  4.  Pt will report improved tolerance of daily activities with at least 20% reduction in pain. Baseline:  Target date: 06/23/2021 Goal status: INITIAL  5.  Improve 5x sit to stand to 15 sec or less to demo improved functional strength and less fall risk. Baseline: 17 sec Target date: 07/21/2021 Goal status: INITIAL    LONG TERM GOALS:  Pt will be safe and ind with HEP Baseline:  Target date: 07/21/2021 Goal status: INITIAL  2.  Pt will be able to participate in 6' walk test with rollator to demo safety and improved endurance with no more than 1 seated brief break. Baseline:  Target date: 07/21/2021 Goal status: INITIAL  3.  Pt will improve bil hip and knee strength to at least 4+/5 for improved safety with stairs, curbs, functional squatting and gait Baseline:  Target date: 07/21/2021 Goal status: INITIAL  4.  Improve FOTO score to at least 50% to demo improved function. Baseline: 40% Target date: 07/21/2021 Goal status: INITIAL  5.  Pt will report at least 50% improvement in pain with daily tasks. Baseline: pain can reach 10/10 Target date: 07/21/2021 Goal status: INITIAL  6.  Pt will be able to demo proper body mechanics for bending tasks such as laundry. Baseline:  Target date: 07/21/2021 Goal status: INITIAL  PLAN: PT FREQUENCY:  1-2x/week  PT DURATION: 8 weeks  PLANNED INTERVENTIONS: Therapeutic exercises, Therapeutic activity, Neuromuscular re-education, Balance training, Gait training, Patient/Family education, Joint mobilization, DME instructions, Aquatic Therapy, Electrical stimulation, Spinal mobilization, Moist heat, Ionotophoresis '4mg'$ /ml Dexamethasone, and Manual therapy  PLAN FOR NEXT SESSION: NuStep, review HEP, go over body mechanics for laundry, try 3' walk test for rollator navigation STG, progress functional strength as tol   Addylynn Balin, PT 05/26/21 3:39 PM

## 2021-05-27 DIAGNOSIS — E7800 Pure hypercholesterolemia, unspecified: Secondary | ICD-10-CM | POA: Diagnosis not present

## 2021-05-27 DIAGNOSIS — E538 Deficiency of other specified B group vitamins: Secondary | ICD-10-CM | POA: Diagnosis not present

## 2021-05-27 DIAGNOSIS — Z79899 Other long term (current) drug therapy: Secondary | ICD-10-CM | POA: Diagnosis not present

## 2021-05-27 DIAGNOSIS — R7301 Impaired fasting glucose: Secondary | ICD-10-CM | POA: Diagnosis not present

## 2021-05-27 DIAGNOSIS — E039 Hypothyroidism, unspecified: Secondary | ICD-10-CM | POA: Diagnosis not present

## 2021-05-27 DIAGNOSIS — E559 Vitamin D deficiency, unspecified: Secondary | ICD-10-CM | POA: Diagnosis not present

## 2021-05-31 DIAGNOSIS — R7301 Impaired fasting glucose: Secondary | ICD-10-CM | POA: Diagnosis not present

## 2021-05-31 DIAGNOSIS — Z0000 Encounter for general adult medical examination without abnormal findings: Secondary | ICD-10-CM | POA: Diagnosis not present

## 2021-05-31 DIAGNOSIS — D692 Other nonthrombocytopenic purpura: Secondary | ICD-10-CM | POA: Diagnosis not present

## 2021-05-31 DIAGNOSIS — I129 Hypertensive chronic kidney disease with stage 1 through stage 4 chronic kidney disease, or unspecified chronic kidney disease: Secondary | ICD-10-CM | POA: Diagnosis not present

## 2021-05-31 DIAGNOSIS — E039 Hypothyroidism, unspecified: Secondary | ICD-10-CM | POA: Diagnosis not present

## 2021-05-31 DIAGNOSIS — E538 Deficiency of other specified B group vitamins: Secondary | ICD-10-CM | POA: Diagnosis not present

## 2021-05-31 DIAGNOSIS — E559 Vitamin D deficiency, unspecified: Secondary | ICD-10-CM | POA: Diagnosis not present

## 2021-05-31 DIAGNOSIS — I447 Left bundle-branch block, unspecified: Secondary | ICD-10-CM | POA: Diagnosis not present

## 2021-05-31 DIAGNOSIS — I700 Atherosclerosis of aorta: Secondary | ICD-10-CM | POA: Diagnosis not present

## 2021-05-31 DIAGNOSIS — E7800 Pure hypercholesterolemia, unspecified: Secondary | ICD-10-CM | POA: Diagnosis not present

## 2021-05-31 DIAGNOSIS — Z79899 Other long term (current) drug therapy: Secondary | ICD-10-CM | POA: Diagnosis not present

## 2021-05-31 DIAGNOSIS — I10 Essential (primary) hypertension: Secondary | ICD-10-CM | POA: Diagnosis not present

## 2021-06-08 ENCOUNTER — Other Ambulatory Visit: Payer: Self-pay

## 2021-06-08 ENCOUNTER — Encounter: Payer: Self-pay | Admitting: Physical Therapy

## 2021-06-08 ENCOUNTER — Ambulatory Visit: Payer: PPO | Admitting: Physical Therapy

## 2021-06-08 DIAGNOSIS — M25551 Pain in right hip: Secondary | ICD-10-CM

## 2021-06-08 DIAGNOSIS — M545 Low back pain, unspecified: Secondary | ICD-10-CM | POA: Diagnosis not present

## 2021-06-08 DIAGNOSIS — R293 Abnormal posture: Secondary | ICD-10-CM

## 2021-06-08 DIAGNOSIS — G8929 Other chronic pain: Secondary | ICD-10-CM

## 2021-06-08 DIAGNOSIS — M25552 Pain in left hip: Secondary | ICD-10-CM

## 2021-06-08 DIAGNOSIS — M6281 Muscle weakness (generalized): Secondary | ICD-10-CM

## 2021-06-08 NOTE — Therapy (Signed)
?OUTPATIENT PHYSICAL THERAPY TREATMENT NOTE ? ? ?Patient Name: Susan Marks ?MRN: [id number] ?DOB:1925/06/12, 88 y.o., female ?Today's Date: 06/08/2021 ? ?PCP: Jonathon Jordan, MD ?REFERRING PROVIDER: Jonathon Jordan, MD ? ? PT End of Session - 06/08/21 1102   ? ? Visit Number 2   ? Date for PT Re-Evaluation 07/21/21   ? Authorization Type [employer]   ? PT Start Time 1102   ? PT Stop Time 1141   ? PT Time Calculation (min) 39 min   ? Activity Tolerance Patient tolerated treatment well   ? Behavior During Therapy [facility] for tasks assessed/performed   ? ?  ?  ? ?  ? ? ?Past Medical History:  ?Diagnosis Date  ? Chronic headaches   ? migraines  ? Chronic kidney disease   ? High blood pressure   ? High cholesterol   ? ?Past Surgical History:  ?Procedure Laterality Date  ? VESICOVAGINAL FISTULA CLOSURE W/ TAH    ? ?Patient Active Problem List  ? Diagnosis Date Noted  ? LBBB (left bundle branch block) 03/30/2018  ? Diverticulitis of intestine with perforation without abscess 03/30/2018  ? Chest pain 03/10/2014  ? Benign essential HTN 03/10/2014  ? Abnormal EKG 03/10/2014  ? GERD (gastroesophageal reflux disease) 07/07/2013  ? Granulomatous lung disease ([place]) 07/07/2013  ? Cough 06/13/2013  ? ? ?REFERRING DIAG: M16.0 (ICD-10-CM) - Bilateral primary osteoarthritis of hip R29.6 (ICD-10-CM) - Repeated falls M47.816 (ICD-10-CM) - Spondylosis without myelopathy or radiculopathy, lumbar region  ? ?THERAPY DIAG:  ?Chronic right-sided low back pain, unspecified whether sciatica present ? ?Pain in right hip ? ?Pain in left hip ? ?Muscle weakness (generalized) ? ?Abnormal posture ? ?PERTINENT HISTORY: falls, osteoporosis ? ?PRECAUTIONS: falls, osteoporosis ? ?SUBJECTIVE: I haven't gone to [place] to look into a different walker option.  I didn't do my exercises.  My pain is up this morning and was bad in the car on the way over.  A 7/10.  I am doing ok with moving around in bed and I can sleep on my sides if I am very  careful.  ? ?PAIN:  ? ?Are you having pain? No ?NPRS scale: 7/10 ?Pain location: Rt low back, buttock, lateral hip into thigh and leg ?Pain orientation: Right  ?PAIN TYPE: aching, dull, and throbbing ?Pain description: intermittent  ?Aggravating factors: bending (I have a reacher and try not to bend) ?Relieving factors: lying down, pain patches ? ? ?OBJECTIVE:  ?  ?DIAGNOSTIC FINDINGS:  ?Scoliosis, signif OA and DDD lumbar spine, anterolisthesis L4 on L5, OA bil hips ?  ?PATIENT SURVEYS:  ?FOTO 40%, goal 50% ?  ?  ?COGNITION: ?         Overall cognitive status: Within functional limits for tasks assessed              ?          ?  ?MUSCLE LENGTH: ?Hamstrings: Right 65 deg; Left 65 deg ?Limited piriformis bil 30% ?Limited adductors Lt>Rt ?  ?POSTURE:  ?Excessive thoracic kyphosis, scoliosis (mild) ?  ?PALPATION: ?Signif tenderness bil greater trochanter and glut tendons, Rt SI joint ?  ?LUMBARAROM/PROM ?  ?A/PROM A/PROM  ?05/26/2021  ?Flexion Limited 30%  ?Extension Not tested  ?Right lateral flexion Limited 50%, pain  ?Left lateral flexion Limited 50%, pain  ?Right rotation Limited 75%  ?Left rotation Limited 75%  ? (Blank rows = not tested) ?  ?LE AROM/PROM: ?Lt hip ER 6 deg ?Rt hip ER 20 deg ?Bil hip  flexion 95 deg ?  ?LE MMT: ?Bil hips 4-/5 ?Bil knees 4/5 ?Ankle DF 5/5 ?  ?  ?FUNCTIONAL TESTS:  ?5 times sit to stand: 17 ?Timed up and go (TUG): 15  with 3-wheeled rollator ?  ?GAIT: ?Distance walked: n/a ?Assistive device utilized:  3-wheeled rollator ?Level of assistance: Complete Independence ?Comments: appears in good control with 3-wheeled rollator ?  ?  ?  ?TODAY'S TREATMENT  ? ?06/08/21: ?Nustep L1 seat 6 arms 9 x 4' PT present to monitor and discuss symptoms ?Seated hamsting stretch bil 2x20" ?Supine single knee to chest 2x15" bil ?Supine piriformis stretch 2x20" bil with gentle overpressure ?March LE taps to 6" step with bil UE support x 20 reps ?Bil heel raise x 10 bil UE support ?Standing red tband row bil  UE x 20 reps ?2x5 sit to stand from chair hands on thighs  ?LAQ from chair 1x10 bil ?Gait with 3-wheeled walker x 4' negotiating obstacles within gym ? ?05/26/21: ?Discussion about pros and cons of 3-wheel and 4-wheel rollator, info given to call [place] store to go check out a 4-wheeled rollator  ?for improved stability (Pt concerned about weight for lifting it) ?Seated hamstring stretch bil ?Supine piriformis stretch bil ?Sit to stand  ?  ?  ?PATIENT EDUCATION:  ?Education details: Access Code: [email or web address] ?Person educated: Patient ?Education method: Explanation, Demonstration, Tactile cues, Verbal cues, and Handouts ?Education comprehension: verbalized understanding, returned demonstration, and verbal cues required ?  ?  ?HOME EXERCISE PROGRAM: ?Access Code: [email or web address] ?URL: [email or web address] ?Date: 05/26/2021 ?Prepared by: Venetia Night Janele Lague ?  ?Exercises ?Sit to Stand with Armchair - 3 x daily - 7 x weekly - 1 sets - 5 reps ?Seated Hamstring Stretch - 3 x daily - 7 x weekly - 1 sets - 1 reps - 20 hold ?Supine Piriformis Stretch - 2 x daily - 7 x weekly - 3 sets - 10 reps ?  ?  ?ASSESSMENT: ?  ?CLINICAL IMPRESSION: ?Pt has not been compliant with initial HEP.  She arrived with 7/10 pain in Rt hip but within session focused on stretching, functional strength and gait with 3-wheeled walker Pt reported 0/10 pain.  She was able to perform sit to stand with hands on thighs today with less need for UE assist.  PT encouraged Pt to perform stretches in bed on waking in AM to help with pain on waking.  Will progress HEP next time if stretches going well at home and good response after today's session. ?  ?  ?OBJECTIVE IMPAIRMENTS decreased balance, decreased ROM, decreased strength, hypomobility, impaired flexibility, improper body mechanics, postural dysfunction, and pain.  ?  ?ACTIVITY LIMITATIONS cleaning, community activity, driving, meal prep, laundry, and shopping.  ?  ?PERSONAL FACTORS Age and  Time since onset of injury/illness/exacerbation are also affecting patient's functional outcome.  ?  ?  ?REHAB POTENTIAL: Good ?  ?CLINICAL DECISION MAKING: Stable/uncomplicated ?  ?EVALUATION COMPLEXITY: Low ?  ?  ?GOALS: ?Goals reviewed with patient? Yes ?  ?SHORT TERM GOALS: ?  ?Pt will be ind with initial HEP without exacerbation of symptoms ?Baseline:  ?Target date: 06/23/2021 ?Goal status: INITIAL ?  ?2.  Pt will demo safety with 3-wheeled rollator with 3' walk test navigating around obstacles in clinic. ?Baseline:  ?Target date: 06/23/2021 ?Goal status: INITIAL ?  ?3.  Pt will look into 4-wheeled rollator to see if she feels more stable and can lift it into and from car ?Baseline:  ?Target date: 06/23/2021 ?Goal status: INITIAL ?  ?  4.  Pt will report improved tolerance of daily activities with at least 20% reduction in pain. ?Baseline:  ?Target date: 06/23/2021 ?Goal status: INITIAL ?  ?5.  Improve 5x sit to stand to 15 sec or less to demo improved functional strength and less fall risk. ?Baseline: 17 sec ?Target date: 07/21/2021 ?Goal status: INITIAL ?  ?  ?  ?LONG TERM GOALS: ?  ?Pt will be safe and ind with HEP ?Baseline:  ?Target date: 07/21/2021 ?Goal status: INITIAL ?  ?2.  Pt will be able to participate in 6' walk test with rollator to demo safety and improved endurance with no more than 1 seated brief break. ?Baseline:  ?Target date: 07/21/2021 ?Goal status: INITIAL ?  ?3.  Pt will improve bil hip and knee strength to at least 4+/5 for improved safety with stairs, curbs, functional squatting and gait ?Baseline:  ?Target date: 07/21/2021 ?Goal status: INITIAL ?  ?4.  Improve FOTO score to at least 50% to demo improved function. ?Baseline: 40% ?Target date: 07/21/2021 ?Goal status: INITIAL ?  ?5.  Pt will report at least 50% improvement in pain with daily tasks. ?Baseline: pain can reach 10/10 ?Target date: 07/21/2021 ?Goal status: INITIAL ?  ?6.  Pt will be able to demo proper body mechanics for bending tasks such as  laundry. ?Baseline:  ?Target date: 07/21/2021 ?Goal status: INITIAL ?  ?PLAN: ?PT FREQUENCY: 1-2x/week ?  ?PT DURATION: 8 weeks ?  ?PLANNED INTERVENTIONS: Therapeutic exercises, Therapeutic activity, Neuro

## 2021-06-14 DIAGNOSIS — M1991 Primary osteoarthritis, unspecified site: Secondary | ICD-10-CM | POA: Diagnosis not present

## 2021-06-14 DIAGNOSIS — M810 Age-related osteoporosis without current pathological fracture: Secondary | ICD-10-CM | POA: Diagnosis not present

## 2021-06-14 DIAGNOSIS — M7061 Trochanteric bursitis, right hip: Secondary | ICD-10-CM | POA: Diagnosis not present

## 2021-06-14 DIAGNOSIS — Z6824 Body mass index (BMI) 24.0-24.9, adult: Secondary | ICD-10-CM | POA: Diagnosis not present

## 2021-06-15 ENCOUNTER — Other Ambulatory Visit: Payer: Self-pay

## 2021-06-15 ENCOUNTER — Ambulatory Visit: Payer: PPO | Admitting: Physical Therapy

## 2021-06-15 ENCOUNTER — Encounter: Payer: Self-pay | Admitting: Physical Therapy

## 2021-06-15 DIAGNOSIS — M545 Low back pain, unspecified: Secondary | ICD-10-CM | POA: Diagnosis not present

## 2021-06-15 DIAGNOSIS — R293 Abnormal posture: Secondary | ICD-10-CM

## 2021-06-15 DIAGNOSIS — M25551 Pain in right hip: Secondary | ICD-10-CM

## 2021-06-15 DIAGNOSIS — G8929 Other chronic pain: Secondary | ICD-10-CM

## 2021-06-15 DIAGNOSIS — M25552 Pain in left hip: Secondary | ICD-10-CM

## 2021-06-15 DIAGNOSIS — M6281 Muscle weakness (generalized): Secondary | ICD-10-CM

## 2021-06-15 NOTE — Therapy (Signed)
?OUTPATIENT PHYSICAL THERAPY TREATMENT NOTE ? ? ?Patient Name: Susan Marks ?MRN: [id number] ?DOB:01-23-1926, 88 y.o., female ?Today's Date: 06/15/2021 ? ?PCP: Jonathon Jordan, MD ?REFERRING PROVIDER: Jonathon Jordan, MD ? ? PT End of Session - 06/15/21 1106   ? ? Visit Number 3   ? Date for PT Re-Evaluation 07/21/21   ? Authorization Type [employer]   ? PT Start Time 1103   ? PT Stop Time 1144   ? PT Time Calculation (min) 41 min   ? Activity Tolerance Patient tolerated treatment well   ? Behavior During Therapy [facility] for tasks assessed/performed   ? ?  ?  ? ?  ? ? ?Past Medical History:  ?Diagnosis Date  ? Chronic headaches   ? migraines  ? Chronic kidney disease   ? High blood pressure   ? High cholesterol   ? ?Past Surgical History:  ?Procedure Laterality Date  ? VESICOVAGINAL FISTULA CLOSURE W/ TAH    ? ?Patient Active Problem List  ? Diagnosis Date Noted  ? LBBB (left bundle branch block) 03/30/2018  ? Diverticulitis of intestine with perforation without abscess 03/30/2018  ? Chest pain 03/10/2014  ? Benign essential HTN 03/10/2014  ? Abnormal EKG 03/10/2014  ? GERD (gastroesophageal reflux disease) 07/07/2013  ? Granulomatous lung disease ([place]) 07/07/2013  ? Cough 06/13/2013  ? ? ?REFERRING DIAG: M16.0 (ICD-10-CM) - Bilateral primary osteoarthritis of hip R29.6 (ICD-10-CM) - Repeated falls M47.816 (ICD-10-CM) - Spondylosis without myelopathy or radiculopathy, lumbar region  ? ?THERAPY DIAG:  ?Chronic right-sided low back pain, unspecified whether sciatica present ? ?Pain in right hip ? ?Pain in left hip ? ?Muscle weakness (generalized) ? ?Abnormal posture ? ?PERTINENT HISTORY: falls, osteoporosis ? ?PRECAUTIONS: falls, osteoporosis ? ?SUBJECTIVE: the hip stretch hurts when I do it and continues to hurt afterwards.  I don't have any pain today.   ? ?PAIN:  ? ?Are you having pain? No ?NPRS scale: 0/10 ?Pain location: Rt low back, buttock, lateral hip into thigh and leg ?Pain orientation: Right  ?PAIN  TYPE: aching, dull, and throbbing ?Pain description: intermittent  ?Aggravating factors: bending (I have a reacher and try not to bend) ?Relieving factors: lying down, pain patches ? ? ?OBJECTIVE:  ?  ?DIAGNOSTIC FINDINGS:  ?Scoliosis, signif OA and DDD lumbar spine, anterolisthesis L4 on L5, OA bil hips ?  ?PATIENT SURVEYS:  ?FOTO 40%, goal 50% ?  ?  ?COGNITION: ?         Overall cognitive status: Within functional limits for tasks assessed              ?          ?  ?MUSCLE LENGTH: ?Hamstrings: Right 65 deg; Left 65 deg ?Limited piriformis bil 30% ?Limited adductors Lt>Rt ?  ?POSTURE:  ?Excessive thoracic kyphosis, scoliosis (mild) ?  ?PALPATION: ?Signif tenderness bil greater trochanter and glut tendons, Rt SI joint ?  ?LUMBARAROM/PROM ?  ?A/PROM A/PROM  ?05/26/2021  ?Flexion Limited 30%  ?Extension Not tested  ?Right lateral flexion Limited 50%, pain  ?Left lateral flexion Limited 50%, pain  ?Right rotation Limited 75%  ?Left rotation Limited 75%  ? (Blank rows = not tested) ?  ?LE AROM/PROM: ?Lt hip ER 6 deg ?Rt hip ER 20 deg ?Bil hip flexion 95 deg ?  ?LE MMT: ?Bil hips 4-/5 ?Bil knees 4/5 ?Ankle DF 5/5 ?  ?  ?FUNCTIONAL TESTS:  ?5 times sit to stand: 17 ?Timed up and go (TUG): 15  with 3-wheeled rollator ?  ?  GAIT: ?Distance walked: n/a ?Assistive device utilized:  3-wheeled rollator ?Level of assistance: Complete Independence ?Comments: appears in good control with 3-wheeled rollator ?  ?  ?  ?TODAY'S TREATMENT  ?06/15/21: ?Gait with 3-wheeled walker warm up 2 laps around ortho and cancer rehab gyms in 5:25, supervision by PT ?Nustep L3 x 7' PT present to monitor and discuss symptoms ?Standing bil heel raises 1x10, bil UE support ?Standing hip abd and ext, alt Rt/Lt, x 5 each, bil UE support ?LAQ 2x10 ?Sit to stand 2x5 from chair  ?Standing red tband row x 20 reps ? ?06/08/21: ?Nustep L1 seat 6 arms 9 x 4' PT present to monitor and discuss symptoms ?Seated hamsting stretch bil 2x20" ?Supine single knee to chest  2x15" bil ?Supine piriformis stretch 2x20" bil with gentle overpressure ?March LE taps to 6" step with bil UE support x 20 reps ?Bil heel raise x 10 bil UE support ?Standing red tband row bil UE x 20 reps ?2x5 sit to stand from chair hands on thighs  ?LAQ from chair 1x10 bil ?Gait with 3-wheeled walker x 4' negotiating obstacles within gym ? ?05/26/21: ?Discussion about pros and cons of 3-wheel and 4-wheel rollator, info given to call [place] store to go check out a 4-wheeled rollator  ?for improved stability (Pt concerned about weight for lifting it) ?Seated hamstring stretch bil ?Supine piriformis stretch bil ?Sit to stand  ?  ?  ?PATIENT EDUCATION:  ?Education details: Access Code: [email or web address] ?Person educated: Patient ?Education method: Explanation, Demonstration, Tactile cues, Verbal cues, and Handouts ?Education comprehension: verbalized understanding, returned demonstration, and verbal cues required ?  ?  ?HOME EXERCISE PROGRAM: ?Access Code: [email or web address] ?URL: [email or web address] ?Date: 05/26/2021 ?Prepared by: Venetia Night Loyal Rudy ?  ?Exercises ?Sit to Stand with Armchair - 3 x daily - 7 x weekly - 1 sets - 5 reps ?Seated Hamstring Stretch - 3 x daily - 7 x weekly - 1 sets - 1 reps - 20 hold ?Supine Piriformis Stretch - 2 x daily - 7 x weekly - 3 sets - 10 reps ?  ?  ?ASSESSMENT: ?  ?CLINICAL IMPRESSION: ?Pt went to [facility] and trialed a rollator.  She was concerned about the extra weight and did not purchase but may try it again.  She continues to appear safe with 3-wheeled walker within sessions.  She arrived without pain but did report she stopped piriformis stretch due to pain with this.  She tolerated extra distance/time for gait warm up today covering 2 laps around both gyms in 5:25, followed by [employer] x 7'. She increased reps of LAQ and sit to stand which she still uses hands on thighs for assistance with nearly 100% success on first try of each rep.  PT introduced standing hip  abd and ext today with some report of mild discomfort in Rt hip following these.  Careful monitoring of Pt response to treatment throughout session. ?  ?  ?OBJECTIVE IMPAIRMENTS decreased balance, decreased ROM, decreased strength, hypomobility, impaired flexibility, improper body mechanics, postural dysfunction, and pain.  ?  ?ACTIVITY LIMITATIONS cleaning, community activity, driving, meal prep, laundry, and shopping.  ?  ?PERSONAL FACTORS Age and Time since onset of injury/illness/exacerbation are also affecting patient's functional outcome.  ?  ?  ?REHAB POTENTIAL: Good ?  ?CLINICAL DECISION MAKING: Stable/uncomplicated ?  ?EVALUATION COMPLEXITY: Low ?  ?  ?GOALS: ?Goals reviewed with patient? Yes ?  ?SHORT TERM GOALS: ?  ?Pt will be ind with initial HEP without exacerbation of  symptoms ?Baseline:  ?Target date: 06/23/2021 ?Goal status: ONGOING ?  ?2.  Pt will demo safety with 3-wheeled rollator with 3' walk test navigating around obstacles in clinic. ?Baseline: safe x 2 trials over 2 visits, 4' and 5.5' ?Target date: 06/23/2021 ?Goal status: MET ?  ?3.  Pt will look into 4-wheeled rollator to see if she feels more stable and can lift it into and from car ?Baseline: trialed at [facility] [place], too heavy for car transfers ?Target date: 06/23/2021 ?Goal status: MET ?  ?4.  Pt will report improved tolerance of daily activities with at least 20% reduction in pain. ?Baseline:  ?Target date: 06/23/2021 ?Goal status: INITIAL ?  ?5.  Improve 5x sit to stand to 15 sec or less to demo improved functional strength and less fall risk. ?Baseline: 17 sec ?Target date: 07/21/2021 ?Goal status: INITIAL ?  ?  ?  ?LONG TERM GOALS: ?  ?Pt will be safe and ind with HEP ?Baseline:  ?Target date: 07/21/2021 ?Goal status: INITIAL ?  ?2.  Pt will be able to participate in 6' walk test with rollator to demo safety and improved endurance with no more than 1 seated brief break. ?Baseline:  ?Target date: 07/21/2021 ?Goal status: INITIAL ?  ?3.  Pt will  improve bil hip and knee strength to at least 4+/5 for improved safety with stairs, curbs, functional squatting and gait ?Baseline:  ?Target date: 07/21/2021 ?Goal status: INITIAL ?  ?4.  Improve FOTO score

## 2021-06-22 ENCOUNTER — Ambulatory Visit: Payer: PPO | Attending: Family Medicine | Admitting: Physical Therapy

## 2021-06-22 ENCOUNTER — Encounter: Payer: Self-pay | Admitting: Physical Therapy

## 2021-06-22 DIAGNOSIS — M545 Low back pain, unspecified: Secondary | ICD-10-CM | POA: Insufficient documentation

## 2021-06-22 DIAGNOSIS — G8929 Other chronic pain: Secondary | ICD-10-CM | POA: Insufficient documentation

## 2021-06-22 DIAGNOSIS — R293 Abnormal posture: Secondary | ICD-10-CM | POA: Insufficient documentation

## 2021-06-22 DIAGNOSIS — M25551 Pain in right hip: Secondary | ICD-10-CM | POA: Diagnosis not present

## 2021-06-22 DIAGNOSIS — M6281 Muscle weakness (generalized): Secondary | ICD-10-CM | POA: Diagnosis not present

## 2021-06-22 NOTE — Therapy (Signed)
?OUTPATIENT PHYSICAL THERAPY TREATMENT NOTE ? ? ?Patient Name: Susan Marks ?MRN: [id number] ?DOB:August 24, 1925, 88 y.o., female ?Today's Date: 06/22/2021 ? ?PCP: Jonathon Jordan, MD ?REFERRING PROVIDER: Jonathon Jordan, MD ? ? PT End of Session - 06/22/21 1106   ? ? Visit Number 4   ? Date for PT Re-Evaluation 07/21/21   ? Authorization Type [employer]   ? PT Start Time 1105   ? PT Stop Time 1148   ? PT Time Calculation (min) 43 min   ? Activity Tolerance Patient tolerated treatment well   ? Behavior During Therapy [facility] for tasks assessed/performed   ? ?  ?  ? ?  ? ? ?Past Medical History:  ?Diagnosis Date  ? Chronic headaches   ? migraines  ? Chronic kidney disease   ? High blood pressure   ? High cholesterol   ? ?Past Surgical History:  ?Procedure Laterality Date  ? VESICOVAGINAL FISTULA CLOSURE W/ TAH    ? ?Patient Active Problem List  ? Diagnosis Date Noted  ? LBBB (left bundle branch block) 03/30/2018  ? Diverticulitis of intestine with perforation without abscess 03/30/2018  ? Chest pain 03/10/2014  ? Benign essential HTN 03/10/2014  ? Abnormal EKG 03/10/2014  ? GERD (gastroesophageal reflux disease) 07/07/2013  ? Granulomatous lung disease ([place]) 07/07/2013  ? Cough 06/13/2013  ? ? ?REFERRING DIAG: M16.0 (ICD-10-CM) - Bilateral primary osteoarthritis of hip R29.6 (ICD-10-CM) - Repeated falls M47.816 (ICD-10-CM) - Spondylosis without myelopathy or radiculopathy, lumbar region  ? ?THERAPY DIAG:  ?Chronic right-sided low back pain, unspecified whether sciatica present ? ?Pain in right hip ? ?Muscle weakness (generalized) ? ?Abnormal posture ? ?PERTINENT HISTORY: falls, osteoporosis ? ?PRECAUTIONS: falls, osteoporosis ? ?SUBJECTIVE: I had a bad night with Rt hip pain.  I had to use an ice pack this morning and almost didn't come.  I am much better now but we will see how PT goes today. ? ?PAIN:  ? ?Are you having pain? No ?NPRS scale: 4-5/10 ?Pain location: Rt low back, buttock, lateral hip into thigh and  leg ?Pain orientation: Right  ?PAIN TYPE: aching, dull, and throbbing ?Pain description: intermittent  ?Aggravating factors: bending (I have a reacher and try not to bend) ?Relieving factors: lying down, pain patches ? ? ?OBJECTIVE:  ?  ?DIAGNOSTIC FINDINGS:  ?Scoliosis, signif OA and DDD lumbar spine, anterolisthesis L4 on L5, OA bil hips ?  ?PATIENT SURVEYS:  ?FOTO 40%, goal 50% ?  ?  ?COGNITION: ?         Overall cognitive status: Within functional limits for tasks assessed              ?          ?  ?MUSCLE LENGTH: ?Hamstrings: Right 65 deg; Left 65 deg ?Limited piriformis bil 30% ?Limited adductors Lt>Rt ?  ?POSTURE:  ?Excessive thoracic kyphosis, scoliosis (mild) ?  ?PALPATION: ?Signif tenderness bil greater trochanter and glut tendons, Rt SI joint ?  ?LUMBARAROM/PROM ?  ?A/PROM A/PROM  ?05/26/2021  ?Flexion Limited 30%  ?Extension Not tested  ?Right lateral flexion Limited 50%, pain  ?Left lateral flexion Limited 50%, pain  ?Right rotation Limited 75%  ?Left rotation Limited 75%  ? (Blank rows = not tested) ?  ?LE AROM/PROM: ?Lt hip ER 6 deg ?Rt hip ER 20 deg ?Bil hip flexion 95 deg ?  ?LE MMT: ?Bil hips 4-/5 ?Bil knees 4/5 ?Ankle DF 5/5 ?  ?  ?FUNCTIONAL TESTS:  ?5 times sit to stand: 17 ?Timed  up and go (TUG): 15  with 3-wheeled rollator ?  ?GAIT: ?Distance walked: n/a ?Assistive device utilized:  3-wheeled rollator ?Level of assistance: Complete Independence ?Comments: appears in good control with 3-wheeled rollator ?  ?  ?  ?TODAY'S TREATMENT  ?06/22/21: ?Nustep: L4 x 6' seat 6 arms 8 PT closely monitoring for tolerance ?6' walk test with 3-wheel walker - covered 1.5 laps around gyms, slower pace noted today due to pain this morning, improved with walking ?Seated heel/toe raises x 15 ?March taps to 4" step at barre bil UE support x 20 ?Sidestepping along barre x 2 laps, fatigue with this ?LAQ from chair 2x10 ?Sit to stand chair + pad today 2x5, VC for hip hinge and feet under her ?Forward step overs x 4  hurdles single UE support at barre 2 laps each leading with Rt and Lt LE ?Standing red tband row x 12 reps, bil shoulder extension x 6 reps ? ?06/15/21: ?Gait with 3-wheeled walker warm up 2 laps around ortho and cancer rehab gyms in 5:25, supervision by PT ?Nustep L3 x 7' PT present to monitor and discuss symptoms ?Standing bil heel raises 1x10, bil UE support ?Standing hip abd and ext, alt Rt/Lt, x 5 each, bil UE support ?LAQ 2x10 ?Sit to stand 2x5 from chair  ?Standing red tband row x 20 reps ? ?06/08/21: ?Nustep L1 seat 6 arms 9 x 4' PT present to monitor and discuss symptoms ?Seated hamsting stretch bil 2x20" ?Supine single knee to chest 2x15" bil ?Supine piriformis stretch 2x20" bil with gentle overpressure ?March LE taps to 6" step with bil UE support x 20 reps ?Bil heel raise x 10 bil UE support ?Standing red tband row bil UE x 20 reps ?2x5 sit to stand from chair hands on thighs  ?LAQ from chair 1x10 bil ?Gait with 3-wheeled walker x 4' negotiating obstacles within gym ? ?05/26/21: ?Discussion about pros and cons of 3-wheel and 4-wheel rollator, info given to call [place] store to go check out a 4-wheeled rollator  ?for improved stability (Pt concerned about weight for lifting it) ?Seated hamstring stretch bil ?Supine piriformis stretch bil ?Sit to stand  ?  ?  ?PATIENT EDUCATION:  ?Education details: Access Code: [email or web address] ?Person educated: Patient ?Education method: Explanation, Demonstration, Tactile cues, Verbal cues, and Handouts ?Education comprehension: verbalized understanding, returned demonstration, and verbal cues required ?  ?  ?HOME EXERCISE PROGRAM: ?Access Code: [email or web address] ?URL: [email or web address] ?Date: 05/26/2021 ?Prepared by: Venetia Night Madysyn Hanken ?  ?Exercises ?Sit to Stand with Armchair - 3 x daily - 7 x weekly - 1 sets - 5 reps ?Seated Hamstring Stretch - 3 x daily - 7 x weekly - 1 sets - 1 reps - 20 hold ?Supine Piriformis Stretch - 2 x daily - 7 x weekly - 3 sets - 10  reps ?  ?  ?ASSESSMENT: ?  ?CLINICAL IMPRESSION: ?Pt had a bad night for pain and had to use ice pack this morning and reported she nearly didn't come today.  PT closely monitored Pt and she was able to not only participate but reported 10x the relief from this morning's pain.  PT advanced standing therex for gait and balance today at barre including hurdle step overs and sidestepping.  Pt has trouble rising from chair consistently today so pad added and VC to remind Pt of technique.  Continue along POC. ?  ?  ?OBJECTIVE IMPAIRMENTS decreased balance, decreased ROM, decreased strength, hypomobility, impaired flexibility, improper body mechanics, postural dysfunction, and  pain.  ?  ?ACTIVITY LIMITATIONS cleaning, community activity, driving, meal prep, laundry, and shopping.  ?  ?PERSONAL FACTORS Age and Time since onset of injury/illness/exacerbation are also affecting patient's functional outcome.  ?  ?  ?REHAB POTENTIAL: Good ?  ?CLINICAL DECISION MAKING: Stable/uncomplicated ?  ?EVALUATION COMPLEXITY: Low ?  ?  ?GOALS: ?Goals reviewed with patient? Yes ?  ?SHORT TERM GOALS: ?  ?Pt will be ind with initial HEP without exacerbation of symptoms ?Baseline:  ?Target date: 06/23/2021 ?Goal status: ONGOING ?  ?2.  Pt will demo safety with 3-wheeled rollator with 3' walk test navigating around obstacles in clinic. ?Baseline: safe x 2 trials over 2 visits, 4' and 5.5' ?Target date: 06/23/2021 ?Goal status: MET ?  ?3.  Pt will look into 4-wheeled rollator to see if she feels more stable and can lift it into and from car ?Baseline: trialed at [facility], too heavy for car transfers ?Target date: 06/23/2021 ?Goal status: MET ?  ?4.  Pt will report improved tolerance of daily activities with at least 20% reduction in pain. ?Baseline:  ?Target date: 06/23/2021 ?Goal status: INITIAL ?  ?5.  Improve 5x sit to stand to 15 sec or less to demo improved functional strength and less fall risk. ?Baseline: 17 sec ?Target date: 07/21/2021 ?Goal  status: INITIAL ?  ?  ?  ?LONG TERM GOALS: ?  ?Pt will be safe and ind with HEP ?Baseline:  ?Target date: 07/21/2021 ?Goal status: INITIAL ?  ?2.  Pt will be able to participate in 6' walk test with rolla

## 2021-07-05 ENCOUNTER — Encounter: Payer: Self-pay | Admitting: Physical Therapy

## 2021-07-05 ENCOUNTER — Ambulatory Visit: Payer: PPO | Admitting: Physical Therapy

## 2021-07-05 DIAGNOSIS — M545 Low back pain, unspecified: Secondary | ICD-10-CM | POA: Diagnosis not present

## 2021-07-05 DIAGNOSIS — M6281 Muscle weakness (generalized): Secondary | ICD-10-CM

## 2021-07-05 DIAGNOSIS — R293 Abnormal posture: Secondary | ICD-10-CM

## 2021-07-05 DIAGNOSIS — G8929 Other chronic pain: Secondary | ICD-10-CM

## 2021-07-05 DIAGNOSIS — M25551 Pain in right hip: Secondary | ICD-10-CM

## 2021-07-05 NOTE — Therapy (Signed)
?OUTPATIENT PHYSICAL THERAPY TREATMENT NOTE ? ? ?Patient Name: Susan Marks ?MRN: [id number] ?DOB:March 11, 1926, 88 y.o., female ?Today's Date: 07/05/2021 ? ?PCP: Jonathon Jordan, MD ?REFERRING PROVIDER: Jonathon Jordan, MD ? ? PT End of Session - 07/05/21 1234   ? ? Visit Number 5   ? Date for PT Re-Evaluation 07/21/21   ? Authorization Type [employer]   ? PT Start Time 1230   ? PT Stop Time 1310   ? PT Time Calculation (min) 40 min   ? Activity Tolerance Patient tolerated treatment well   ? Behavior During Therapy [facility] for tasks assessed/performed   ? ?  ?  ? ?  ? ? ?Past Medical History:  ?Diagnosis Date  ? Chronic headaches   ? migraines  ? Chronic kidney disease   ? High blood pressure   ? High cholesterol   ? ?Past Surgical History:  ?Procedure Laterality Date  ? VESICOVAGINAL FISTULA CLOSURE W/ TAH    ? ?Patient Active Problem List  ? Diagnosis Date Noted  ? LBBB (left bundle branch block) 03/30/2018  ? Diverticulitis of intestine with perforation without abscess 03/30/2018  ? Chest pain 03/10/2014  ? Benign essential HTN 03/10/2014  ? Abnormal EKG 03/10/2014  ? GERD (gastroesophageal reflux disease) 07/07/2013  ? Granulomatous lung disease ([place]) 07/07/2013  ? Cough 06/13/2013  ? ? ?REFERRING DIAG: M16.0 (ICD-10-CM) - Bilateral primary osteoarthritis of hip R29.6 (ICD-10-CM) - Repeated falls M47.816 (ICD-10-CM) - Spondylosis without myelopathy or radiculopathy, lumbar region  ? ?THERAPY DIAG:  ?Chronic right-sided low back pain, unspecified whether sciatica present ? ?Pain in right hip ? ?Muscle weakness (generalized) ? ?Abnormal posture ? ?PERTINENT HISTORY: falls, osteoporosis ? ?PRECAUTIONS: falls, osteoporosis ? ?SUBJECTIVE: I haven't had as much pain.  I am so grateful.  I got up early today and have already done too much so feel very tired today.   ? ?PAIN:  ? ?Are you having pain? No ?NPRS scale: 1-2/10 ?Pain location: Rt low back, buttock, lateral hip into thigh and leg ?Pain orientation:  Right  ?PAIN TYPE: aching, dull, and throbbing ?Pain description: intermittent  ?Aggravating factors: bending (I have a reacher and try not to bend) ?Relieving factors: lying down, pain patches ? ? ?OBJECTIVE:  ?  ?DIAGNOSTIC FINDINGS:  ?Scoliosis, signif OA and DDD lumbar spine, anterolisthesis L4 on L5, OA bil hips ?  ?PATIENT SURVEYS:  ?FOTO 40%, goal 50% ?07/05/21: 56%, met goal ?  ?  ?COGNITION: ?         Overall cognitive status: Within functional limits for tasks assessed              ?          ?  ?MUSCLE LENGTH: ?Hamstrings: Right 65 deg; Left 65 deg ?Limited piriformis bil 30% ?Limited adductors Lt>Rt ?  ?POSTURE:  ?Excessive thoracic kyphosis, scoliosis (mild) ?  ?PALPATION: ?Signif tenderness bil greater trochanter and glut tendons, Rt SI joint ?  ?LUMBARAROM/PROM ?  ?A/PROM A/PROM  ?05/26/2021  ?Flexion Limited 30%  ?Extension Not tested  ?Right lateral flexion Limited 50%, pain  ?Left lateral flexion Limited 50%, pain  ?Right rotation Limited 75%  ?Left rotation Limited 75%  ? (Blank rows = not tested) ?  ?LE AROM/PROM: ?Lt hip ER 6 deg ?Rt hip ER 20 deg ?Bil hip flexion 95 deg ?  ?LE MMT: ?Bil hips 4-/5 ?Bil knees 4/5 ?Ankle DF 5/5 ?  ?  ?FUNCTIONAL TESTS:  ?5 times sit to stand: 17 ?Timed up and go (  TUG): 15  with 3-wheeled rollator ?  ?GAIT: ?Distance walked: n/a ?Assistive device utilized:  3-wheeled rollator ?Level of assistance: Complete Independence ?Comments: appears in good control with 3-wheeled rollator ?  ?  ?  ?TODAY'S TREATMENT  ?07/05/21: ?NuStep L4 x 6' seat 7 PT present to discuss symptoms and progress ?High knee march at barre bil UE support x 20 ?Sidestepping along barre x 2 laps bil UE support ?Seated heel toe raises x 15 ?Seated ball squeeze 10x3" hold ?Seated clam blue loop band x 15 ?LAQ from chair 2x10 ?6' walk, slower pace today due to fatigue, single lap around both gyms ? ? ?06/22/21: ?Nustep: L4 x 6' seat 6 arms 8 PT closely monitoring for tolerance ?6' walk test with 3-wheel  walker - covered 1.5 laps around gyms, slower pace noted today due to pain this morning, improved with walking ?Seated heel/toe raises x 15 ?March taps to 4" step at barre bil UE support x 20 ?Sidestepping along barre x 2 laps, fatigue with this ?LAQ from chair 2x10 ?Sit to stand chair + pad today 2x5, VC for hip hinge and feet under her ?Forward step overs x 4 hurdles single UE support at barre 2 laps each leading with Rt and Lt LE ?Standing red tband row x 12 reps, bil shoulder extension x 6 reps ? ?06/15/21: ?Gait with 3-wheeled walker warm up 2 laps around ortho and cancer rehab gyms in 5:25, supervision by PT ?Nustep L3 x 7' PT present to monitor and discuss symptoms ?Standing bil heel raises 1x10, bil UE support ?Standing hip abd and ext, alt Rt/Lt, x 5 each, bil UE support ?LAQ 2x10 ?Sit to stand 2x5 from chair  ?Standing red tband row x 20 reps ?  ?  ?  ?PATIENT EDUCATION:  ?Education details: Access Code: [email or web address] ?Person educated: Patient ?Education method: Explanation, Demonstration, Tactile cues, Verbal cues, and Handouts ?Education comprehension: verbalized understanding, returned demonstration, and verbal cues required ?  ?  ?HOME EXERCISE PROGRAM: ?Access Code: [email or web address] ?URL: [email or web address] ?Date: 05/26/2021 ?Prepared by: Venetia Night Gaberial Cada ?  ?Exercises ?Sit to Stand with Armchair - 3 x daily - 7 x weekly - 1 sets - 5 reps ?Seated Hamstring Stretch - 3 x daily - 7 x weekly - 1 sets - 1 reps - 20 hold ?Supine Piriformis Stretch - 2 x daily - 7 x weekly - 3 sets - 10 reps ?  ?  ?ASSESSMENT: ?  ?CLINICAL IMPRESSION: ?Pt reports she has consistently had less pain and can roll onto hip in bed without pain.  She arrived fatigued having had a busy active morning at home.  Her FOTO score improved by 16% reaching 56% exceeding goal.  She continues to have weakness with sit to stand and plans to purchase a pad for her chair to enable her to work on reps from elevated surface as part of HEP.   She is very pleased with her progress to date. ?  ?  ?OBJECTIVE IMPAIRMENTS decreased balance, decreased ROM, decreased strength, hypomobility, impaired flexibility, improper body mechanics, postural dysfunction, and pain.  ?  ?ACTIVITY LIMITATIONS cleaning, community activity, driving, meal prep, laundry, and shopping.  ?  ?PERSONAL FACTORS Age and Time since onset of injury/illness/exacerbation are also affecting patient's functional outcome.  ?  ?  ?REHAB POTENTIAL: Good ?  ?CLINICAL DECISION MAKING: Stable/uncomplicated ?  ?EVALUATION COMPLEXITY: Low ?  ?  ?GOALS: ?Goals reviewed with patient? Yes ?  ?SHORT TERM GOALS: ?  ?Pt will be ind with initial  HEP without exacerbation of symptoms ?Baseline:  ?Target date: 06/23/2021 ?Goal status: ONGOING ?  ?2.  Pt will demo safety with 3-wheeled rollator with 3' walk test navigating around obstacles in clinic. ?Baseline: safe x 2 trials over 2 visits, 4' and 5.5' ?Target date: 06/23/2021 ?Goal status: MET ?  ?3.  Pt will look into 4-wheeled rollator to see if she feels more stable and can lift it into and from car ?Baseline: trialed at [facility], too heavy for car transfers ?Target date: 06/23/2021 ?Goal status: MET ?  ?4.  Pt will report improved tolerance of daily activities with at least 20% reduction in pain. ?Baseline:  ?Target date: 06/23/2021 ?Goal status: MET ?  ?5.  Improve 5x sit to stand to 15 sec or less to demo improved functional strength and less fall risk. ?Baseline: 17 sec ?Target date: 07/21/2021 ?Goal status: MET FROM ELEVATED SURFACE ?  ?  ?  ?LONG TERM GOALS: ?  ?Pt will be safe and ind with HEP ?Baseline:  ?Target date: 07/21/2021 ?Goal status: ONGOING ?  ?2.  Pt will be able to participate in 6' walk test with rollator to demo safety and improved endurance with no more than 1 seated brief break. ?Baseline:  ?Target date: 07/21/2021 ?Goal status: MET WITH 3-WHEEL WALKER ?  ?3.  Pt will improve bil hip and knee strength to at least 4+/5 for improved safety with  stairs, curbs, functional squatting and gait ?Baseline:  ?Target date: 07/21/2021 ?Goal status: ONGOING ?  ?4.  Improve FOTO score to at least 50% to demo improved function. ?Baseline: 40% ?Target date: 5/4/2

## 2021-07-07 ENCOUNTER — Ambulatory Visit: Payer: PPO | Admitting: Physical Therapy

## 2021-07-07 ENCOUNTER — Encounter: Payer: Self-pay | Admitting: Physical Therapy

## 2021-07-07 DIAGNOSIS — M25551 Pain in right hip: Secondary | ICD-10-CM

## 2021-07-07 DIAGNOSIS — M6281 Muscle weakness (generalized): Secondary | ICD-10-CM

## 2021-07-07 DIAGNOSIS — G8929 Other chronic pain: Secondary | ICD-10-CM

## 2021-07-07 DIAGNOSIS — M545 Low back pain, unspecified: Secondary | ICD-10-CM | POA: Diagnosis not present

## 2021-07-07 NOTE — Therapy (Signed)
?OUTPATIENT PHYSICAL THERAPY TREATMENT NOTE ? ? ?Patient Name: Susan Marks ?MRN: [id number] ?DOB:11-09-1925, 88 y.o., female ?Today's Date: 07/07/2021 ? ?PCP: Jonathon Jordan, MD ?REFERRING PROVIDER: Jonathon Jordan, MD ? ? PT End of Session - 07/07/21 1235   ? ? Visit Number 6   ? Date for PT Re-Evaluation 07/21/21   ? Authorization Type [employer]   ? PT Start Time 1231   ? PT Stop Time 1311   ? PT Time Calculation (min) 40 min   ? Activity Tolerance Patient tolerated treatment well   ? Behavior During Therapy [facility] for tasks assessed/performed   ? ?  ?  ? ?  ? ? ?Past Medical History:  ?Diagnosis Date  ? Chronic headaches   ? migraines  ? Chronic kidney disease   ? High blood pressure   ? High cholesterol   ? ?Past Surgical History:  ?Procedure Laterality Date  ? VESICOVAGINAL FISTULA CLOSURE W/ TAH    ? ?Patient Active Problem List  ? Diagnosis Date Noted  ? LBBB (left bundle branch block) 03/30/2018  ? Diverticulitis of intestine with perforation without abscess 03/30/2018  ? Chest pain 03/10/2014  ? Benign essential HTN 03/10/2014  ? Abnormal EKG 03/10/2014  ? GERD (gastroesophageal reflux disease) 07/07/2013  ? Granulomatous lung disease ([place]) 07/07/2013  ? Cough 06/13/2013  ? ? ?REFERRING DIAG: M16.0 (ICD-10-CM) - Bilateral primary osteoarthritis of hip R29.6 (ICD-10-CM) - Repeated falls M47.816 (ICD-10-CM) - Spondylosis without myelopathy or radiculopathy, lumbar region  ? ?THERAPY DIAG:  ?Chronic right-sided low back pain, unspecified whether sciatica present ? ?Pain in right hip ? ?Muscle weakness (generalized) ? ?PERTINENT HISTORY: falls, osteoporosis ? ?PRECAUTIONS: falls, osteoporosis ? ?SUBJECTIVE: I continue to have much less pain and am feeling better today compared to last time for my energy.  Sometimes it hurts my hip to try to tie my Rt shoe.  ? ?PAIN:  ? ?Are you having pain? No ?NPRS scale: 1-2/10 ?Pain location: Rt low back, buttock, lateral hip into thigh and leg ?Pain  orientation: Right  ?PAIN TYPE: aching, dull, and throbbing ?Pain description: intermittent  ?Aggravating factors: bending (I have a reacher and try not to bend) ?Relieving factors: lying down, pain patches ? ? ?OBJECTIVE:  ?  ?DIAGNOSTIC FINDINGS:  ?Scoliosis, signif OA and DDD lumbar spine, anterolisthesis L4 on L5, OA bil hips ?  ?PATIENT SURVEYS:  ?FOTO 40%, goal 50% ?07/05/21: 56%, met goal ?  ?  ?COGNITION: ?         Overall cognitive status: Within functional limits for tasks assessed              ?          ?  ?MUSCLE LENGTH: ?Hamstrings: Right 65 deg; Left 65 deg ?Limited piriformis bil 30% ?Limited adductors Lt>Rt ?  ?POSTURE:  ?Excessive thoracic kyphosis, scoliosis (mild) ?  ?PALPATION: ?Signif tenderness bil greater trochanter and glut tendons, Rt SI joint ?  ?LUMBARAROM/PROM ?  ?A/PROM A/PROM  ?05/26/2021  ?Flexion Limited 30%  ?Extension Not tested  ?Right lateral flexion Limited 50%, pain  ?Left lateral flexion Limited 50%, pain  ?Right rotation Limited 75%  ?Left rotation Limited 75%  ? (Blank rows = not tested) ?  ?LE AROM/PROM: ?Lt hip ER 6 deg ?Rt hip ER 20 deg ?Bil hip flexion 95 deg ?  ?LE MMT: ?Bil hips 4-/5 ?Bil knees 4/5 ?Ankle DF 5/5 ?  ?  ?FUNCTIONAL TESTS:  ?5 times sit to stand: 17 ?Timed up and go (  TUG): 15  with 3-wheeled rollator ?  ?GAIT: ?Distance walked: n/a ?Assistive device utilized:  3-wheeled rollator ?Level of assistance: Complete Independence ?Comments: appears in good control with 3-wheeled rollator ?  ?  ?  ?TODAY'S TREATMENT  ?07/07/21 ?NuStep L4 x 6' seat 7 PT present to discuss symptoms and progress ?LAQ from chair 2x10, added 1.5lb ankle weight today ?Seated march 1.5lb ankle weight, alt Lt/Rt 2x20 ?Hold 5lb sit to stand chair + pad 1x10 ?Seated heel/toe raise x 20 ?Sidestepping along barre with bil UE support, VC to avoid ER of leading LE ?Standing toe raise with bil UE support 1x10 ?Standing heel raise with bil UE support 1x10 ?Seated ball squeeze 10x3" hold ?Seated clam  blue loop band x 15 ?Supine Rt SKTC x 5 reps ?Supine Rt piriformis stretch 1x20" ?Gait with 3-wheeled walker x 1 lap around gym ? ?07/05/21: ?NuStep L4 x 6' seat 7 PT present to discuss symptoms and progress ?High knee march at barre bil UE support x 20 ?Sidestepping along barre x 2 laps bil UE support ?Seated heel toe raises x 15 ?Seated ball squeeze 10x3" hold ?Seated clam blue loop band x 15 ?LAQ from chair 2x10 ?6' walk, slower pace today due to fatigue, single lap around both gyms ? ? ?06/22/21: ?Nustep: L4 x 6' seat 6 arms 8 PT closely monitoring for tolerance ?6' walk test with 3-wheel walker - covered 1.5 laps around gyms, slower pace noted today due to pain this morning, improved with walking ?Seated heel/toe raises x 15 ?March taps to 4" step at barre bil UE support x 20 ?Sidestepping along barre x 2 laps, fatigue with this ?LAQ from chair 2x10 ?Sit to stand chair + pad today 2x5, VC for hip hinge and feet under her ?Forward step overs x 4 hurdles single UE support at barre 2 laps each leading with Rt and Lt LE ?Standing red tband row x 12 reps, bil shoulder extension x 6 reps ?  ?  ?PATIENT EDUCATION:  ?Education details: Access Code: [email or web address] ?Person educated: Patient ?Education method: Explanation, Demonstration, Tactile cues, Verbal cues, and Handouts ?Education comprehension: verbalized understanding, returned demonstration, and verbal cues required ?  ?  ?HOME EXERCISE PROGRAM: ?Access Code: [email or web address] ?URL: [email or web address] ?Date: 05/26/2021 ?Prepared by: Venetia Night Airlie Blumenberg ?  ?Exercises ?Sit to Stand with Armchair - 3 x daily - 7 x weekly - 1 sets - 5 reps ?Seated Hamstring Stretch - 3 x daily - 7 x weekly - 1 sets - 1 reps - 20 hold ?Supine Piriformis Stretch - 2 x daily - 7 x weekly - 3 sets - 10 reps ?  ?  ?ASSESSMENT: ?  ?CLINICAL IMPRESSION: ?Pt continues to experience much less Rt hip pain since starting PT.  PT advanced therex to include ankle weights for seated therex and 5lb  kbell to sit to stands.  She was able to demo 10 reps without a break of weighted sit to stand today.  She does continue to require UE assist for sit to stand from chair but can perform w/o UE from chair + pad.  She noted some discomfort in Rt hip as session progressed so PT asked her to monitor and let us know if we need to back down on weights next time. ?  ?  ?OBJECTIVE IMPAIRMENTS decreased balance, decreased ROM, decreased strength, hypomobility, impaired flexibility, improper body mechanics, postural dysfunction, and pain.  ?  ?ACTIVITY LIMITATIONS cleaning, community activity, driving, meal prep, laundry, and shopping.  ?  ?PERSONAL FACTORS  Age and Time since onset of injury/illness/exacerbation are also affecting patient's functional outcome.  ?  ?  ?REHAB POTENTIAL: Good ?  ?CLINICAL DECISION MAKING: Stable/uncomplicated ?  ?EVALUATION COMPLEXITY: Low ?  ?  ?GOALS: ?Goals reviewed with patient? Yes ?  ?SHORT TERM GOALS: ?  ?Pt will be ind with initial HEP without exacerbation of symptoms ?Baseline:  ?Target date: 06/23/2021 ?Goal status: ONGOING ?  ?2.  Pt will demo safety with 3-wheeled rollator with 3' walk test navigating around obstacles in clinic. ?Baseline: safe x 2 trials over 2 visits, 4' and 5.5' ?Target date: 06/23/2021 ?Goal status: MET ?  ?3.  Pt will look into 4-wheeled rollator to see if she feels more stable and can lift it into and from car ?Baseline: trialed at [facility], too heavy for car transfers ?Target date: 06/23/2021 ?Goal status: MET ?  ?4.  Pt will report improved tolerance of daily activities with at least 20% reduction in pain. ?Baseline:  ?Target date: 06/23/2021 ?Goal status: MET ?  ?5.  Improve 5x sit to stand to 15 sec or less to demo improved functional strength and less fall risk. ?Baseline: 17 sec ?Target date: 07/21/2021 ?Goal status: MET FROM ELEVATED SURFACE ?  ?  ?  ?LONG TERM GOALS: ?  ?Pt will be safe and ind with HEP ?Baseline:  ?Target date: 07/21/2021 ?Goal status:  ONGOING ?  ?2.  Pt will be able to participate in 6' walk test with rollator to demo safety and improved endurance with no more than 1 seated brief break. ?Baseline:  ?Target date: 07/21/2021 ?Goal status: MET WITH 3-WHEEL

## 2021-07-12 ENCOUNTER — Ambulatory Visit: Payer: PPO | Admitting: Physical Therapy

## 2021-07-12 ENCOUNTER — Encounter: Payer: Self-pay | Admitting: Physical Therapy

## 2021-07-12 DIAGNOSIS — M6281 Muscle weakness (generalized): Secondary | ICD-10-CM

## 2021-07-12 DIAGNOSIS — G8929 Other chronic pain: Secondary | ICD-10-CM

## 2021-07-12 DIAGNOSIS — M545 Low back pain, unspecified: Secondary | ICD-10-CM | POA: Diagnosis not present

## 2021-07-12 DIAGNOSIS — M25551 Pain in right hip: Secondary | ICD-10-CM

## 2021-07-12 NOTE — Therapy (Signed)
?OUTPATIENT PHYSICAL THERAPY TREATMENT NOTE ? ? ?Patient Name: Susan Marks ?MRN: [id number] ?DOB:27-Apr-1925, 88 y.o., female ?Today's Date: 07/12/2021 ? ?PCP: Jonathon Jordan, MD ?REFERRING PROVIDER: Jonathon Jordan, MD ? ? PT End of Session - 07/12/21 1146   ? ? Visit Number 7   ? Date for PT Re-Evaluation 07/21/21   ? Authorization Type [employer]   ? PT Start Time 1146   ? PT Stop Time 1228   ? PT Time Calculation (min) 42 min   ? Activity Tolerance Patient tolerated treatment well   ? Behavior During Therapy [facility] for tasks assessed/performed   ? ?  ?  ? ?  ? ? ?Past Medical History:  ?Diagnosis Date  ? Chronic headaches   ? migraines  ? Chronic kidney disease   ? High blood pressure   ? High cholesterol   ? ?Past Surgical History:  ?Procedure Laterality Date  ? VESICOVAGINAL FISTULA CLOSURE W/ TAH    ? ?Patient Active Problem List  ? Diagnosis Date Noted  ? LBBB (left bundle branch block) 03/30/2018  ? Diverticulitis of intestine with perforation without abscess 03/30/2018  ? Chest pain 03/10/2014  ? Benign essential HTN 03/10/2014  ? Abnormal EKG 03/10/2014  ? GERD (gastroesophageal reflux disease) 07/07/2013  ? Granulomatous lung disease ([place]) 07/07/2013  ? Cough 06/13/2013  ? ? ?REFERRING DIAG: M16.0 (ICD-10-CM) - Bilateral primary osteoarthritis of hip R29.6 (ICD-10-CM) - Repeated falls M47.816 (ICD-10-CM) - Spondylosis without myelopathy or radiculopathy, lumbar region  ? ?THERAPY DIAG:  ?Chronic right-sided low back pain, unspecified whether sciatica present ? ?Pain in right hip ? ?Muscle weakness (generalized) ? ?PERTINENT HISTORY: falls, osteoporosis ? ?PRECAUTIONS: falls, osteoporosis ? ?SUBJECTIVE: We overdid it with the weights last time.  I had a hard time with the Rt hip for 1.5 days afterwards.  The pad for my chair came and it does help me be able to do the sit to stand strengthening without using my arms.  ? ?PAIN:  ? ?Are you having pain? No ?NPRS scale: 0-1/10 ?Pain location: Rt  low back, buttock, lateral hip into thigh and leg ?Pain orientation: Right  ?PAIN TYPE: aching, dull, and throbbing ?Pain description: intermittent  ?Aggravating factors: bending (I have a reacher and try not to bend) ?Relieving factors: lying down, pain patches ? ? ?OBJECTIVE:  ?  ?DIAGNOSTIC FINDINGS:  ?Scoliosis, signif OA and DDD lumbar spine, anterolisthesis L4 on L5, OA bil hips ?  ?PATIENT SURVEYS:  ?FOTO 40%, goal 50% ?07/05/21: 56%, met goal ?  ?  ?COGNITION: ?         Overall cognitive status: Within functional limits for tasks assessed              ?          ?  ?MUSCLE LENGTH: ?Hamstrings: Right 65 deg; Left 65 deg ?Limited piriformis bil 30% ?Limited adductors Lt>Rt ?  ?POSTURE:  ?Excessive thoracic kyphosis, scoliosis (mild) ?  ?PALPATION: ?Signif tenderness bil greater trochanter and glut tendons, Rt SI joint ?  ?LUMBARAROM/PROM ?  ?A/PROM A/PROM  ?05/26/2021  ?Flexion Limited 30%  ?Extension Not tested  ?Right lateral flexion Limited 50%, pain  ?Left lateral flexion Limited 50%, pain  ?Right rotation Limited 75%  ?Left rotation Limited 75%  ? (Blank rows = not tested) ?  ?LE AROM/PROM: ?Lt hip ER 6 deg ?Rt hip ER 20 deg ?Bil hip flexion 95 deg ?  ?LE MMT: ?Bil hips 4-/5 ?Bil knees 4/5 ?Ankle DF 5/5 ?  ?  ?  FUNCTIONAL TESTS:  ?5 times sit to stand: 17 ?Timed up and go (TUG): 15  with 3-wheeled rollator ?  ?GAIT: ?Distance walked: n/a ?Assistive device utilized:  3-wheeled rollator ?Level of assistance: Complete Independence ?Comments: appears in good control with 3-wheeled rollator ?  ?  ?  ?TODAY'S TREATMENT  ?07/12/21 ?NuStep L3 x 6' PT present to discuss progress/symptoms ?Along barre with UE support: sidestepping x 2 laps, high knee march fwd bwd x 2 laps ?Standing heel toe raises x 10 ?Seated LAQ 2x10 (no ankle weight due to pain set back after last visit) ?Sit to stand chair + pad 2x10 ?Gait with 3-wheeled walker 2 laps around ortho and cancer rehab gyms ?March toe taps to 6" stair x 8 alt Rt/Lt with  bil UE support ? ?07/07/21 ?NuStep L4 x 6' seat 7 PT present to discuss symptoms and progress ?LAQ from chair 2x10, added 1.5lb ankle weight today ?Seated march 1.5lb ankle weight, alt Lt/Rt 2x20 ?Hold 5lb sit to stand chair + pad 1x10 ?Seated heel/toe raise x 20 ?Sidestepping along barre with bil UE support, VC to avoid ER of leading LE ?Standing toe raise with bil UE support 1x10 ?Standing heel raise with bil UE support 1x10 ?Seated ball squeeze 10x3" hold ?Seated clam blue loop band x 15 ?Supine Rt SKTC x 5 reps ?Supine Rt piriformis stretch 1x20" ?Gait with 3-wheeled walker x 1 lap around gym ? ?07/05/21: ?NuStep L4 x 6' seat 7 PT present to discuss symptoms and progress ?High knee march at barre bil UE support x 20 ?Sidestepping along barre x 2 laps bil UE support ?Seated heel toe raises x 15 ?Seated ball squeeze 10x3" hold ?Seated clam blue loop band x 15 ?LAQ from chair 2x10 ?6' walk, slower pace today due to fatigue, single lap around both gyms ? ? ?06/22/21: ?Nustep: L4 x 6' seat 6 arms 8 PT closely monitoring for tolerance ?6' walk test with 3-wheel walker - covered 1.5 laps around gyms, slower pace noted today due to pain this morning, improved with walking ?Seated heel/toe raises x 15 ?March taps to 4" step at barre bil UE support x 20 ?Sidestepping along barre x 2 laps, fatigue with this ?LAQ from chair 2x10 ?Sit to stand chair + pad today 2x5, VC for hip hinge and feet under her ?Forward step overs x 4 hurdles single UE support at barre 2 laps each leading with Rt and Lt LE ?Standing red tband row x 12 reps, bil shoulder extension x 6 reps ?  ?  ?PATIENT EDUCATION:  ?Education details: Access Code: [email or web address] ?Person educated: Patient ?Education method: Explanation, Demonstration, Tactile cues, Verbal cues, and Handouts ?Education comprehension: verbalized understanding, returned demonstration, and verbal cues required ?  ?  ?HOME EXERCISE PROGRAM: ?Access Code: [email or web address] ?URL:  [email or web address] ?Date: 05/26/2021 ?Prepared by: Venetia Night Adrielle Polakowski ?  ?Exercises ?Sit to Stand with Armchair - 3 x daily - 7 x weekly - 1 sets - 5 reps ?Seated Hamstring Stretch - 3 x daily - 7 x weekly - 1 sets - 1 reps - 20 hold ?Supine Piriformis Stretch - 2 x daily - 7 x weekly - 3 sets - 10 reps ?  ?  ?ASSESSMENT: ?  ?CLINICAL IMPRESSION: ?Pt had a set back of increased pain x 1.5 days following last session when PT trialed an increase in resistance/weights.  Returned to body weight and lighter exercise today which was better tolerated.  Pt bought a chair pad which is enabling her to work on sit  to stands more successfully at hoome.  PT cued faster gait speed today which was well demonstrated and safe with 3-wheeled walker. ?  ?  ?OBJECTIVE IMPAIRMENTS decreased balance, decreased ROM, decreased strength, hypomobility, impaired flexibility, improper body mechanics, postural dysfunction, and pain.  ?  ?ACTIVITY LIMITATIONS cleaning, community activity, driving, meal prep, laundry, and shopping.  ?  ?PERSONAL FACTORS Age and Time since onset of injury/illness/exacerbation are also affecting patient's functional outcome.  ?  ?  ?REHAB POTENTIAL: Good ?  ?CLINICAL DECISION MAKING: Stable/uncomplicated ?  ?EVALUATION COMPLEXITY: Low ?  ?  ?GOALS: ?Goals reviewed with patient? Yes ?  ?SHORT TERM GOALS: ?  ?Pt will be ind with initial HEP without exacerbation of symptoms ?Baseline:  ?Target date: 06/23/2021 ?Goal status: ONGOING ?  ?2.  Pt will demo safety with 3-wheeled rollator with 3' walk test navigating around obstacles in clinic. ?Baseline: safe x 2 trials over 2 visits, 4' and 5.5' ?Target date: 06/23/2021 ?Goal status: MET ?  ?3.  Pt will look into 4-wheeled rollator to see if she feels more stable and can lift it into and from car ?Baseline: trialed at [facility], too heavy for car transfers ?Target date: 06/23/2021 ?Goal status: MET ?  ?4.  Pt will report improved tolerance of daily activities  with at least 20% reduction in pain. ?Baseline:  ?Target date: 06/23/2021 ?Goal status: MET ?  ?5.  Improve 5x sit to stand to 15 sec or less to demo improved functional strength and less fall risk. ?Baseline: 17 sec ?Target date: 5/4

## 2021-07-14 ENCOUNTER — Ambulatory Visit: Payer: PPO | Admitting: Physical Therapy

## 2021-07-14 ENCOUNTER — Encounter: Payer: Self-pay | Admitting: Physical Therapy

## 2021-07-14 DIAGNOSIS — M25551 Pain in right hip: Secondary | ICD-10-CM

## 2021-07-14 DIAGNOSIS — G8929 Other chronic pain: Secondary | ICD-10-CM

## 2021-07-14 DIAGNOSIS — M6281 Muscle weakness (generalized): Secondary | ICD-10-CM

## 2021-07-14 DIAGNOSIS — M545 Low back pain, unspecified: Secondary | ICD-10-CM | POA: Diagnosis not present

## 2021-07-14 NOTE — Therapy (Signed)
?OUTPATIENT PHYSICAL THERAPY TREATMENT NOTE ? ? ?Patient Name: Susan Marks ?MRN: [id number] ?DOB:[birth year], 88 y.o., female ?Today's Date: 07/14/2021 ? ?PCP: Jonathon Jordan, MD ?REFERRING PROVIDER: Jonathon Jordan, MD ? ? PT End of Session - 07/14/21 1148   ? ? Visit Number 8   ? Date for PT Re-Evaluation 07/21/21   ? Authorization Type [employer]   ? PT Start Time 1145   ? PT Stop Time 1225   ? PT Time Calculation (min) 40 min   ? Activity Tolerance Patient tolerated treatment well   ? Behavior During Therapy [facility] for tasks assessed/performed   ? ?  ?  ? ?  ? ? ?Past Medical History:  ?Diagnosis Date  ? Chronic headaches   ? migraines  ? Chronic kidney disease   ? High blood pressure   ? High cholesterol   ? ?Past Surgical History:  ?Procedure Laterality Date  ? VESICOVAGINAL FISTULA CLOSURE W/ TAH    ? ?Patient Active Problem List  ? Diagnosis Date Noted  ? LBBB (left bundle branch block) 03/30/2018  ? Diverticulitis of intestine with perforation without abscess 03/30/2018  ? Chest pain 03/10/2014  ? Benign essential HTN 03/10/2014  ? Abnormal EKG 03/10/2014  ? GERD (gastroesophageal reflux disease) 07/07/2013  ? Granulomatous lung disease ([place]) 07/07/2013  ? Cough 06/13/2013  ? ? ?REFERRING DIAG: M16.0 (ICD-10-CM) - Bilateral primary osteoarthritis of hip R29.6 (ICD-10-CM) - Repeated falls M47.816 (ICD-10-CM) - Spondylosis without myelopathy or radiculopathy, lumbar region  ? ?THERAPY DIAG:  ?Chronic right-sided low back pain, unspecified whether sciatica present ? ?Pain in right hip ? ?Muscle weakness (generalized) ? ?PERTINENT HISTORY: falls, osteoporosis ? ?PRECAUTIONS: falls, osteoporosis ? ?SUBJECTIVE: We overdid it with the weights last time.  I had a hard time with the Rt hip for 1.5 days afterwards.  The pad for my chair came and it does help me be able to do the sit to stand strengthening without using my arms.  ? ?PAIN:  ? ?Are you having pain? No ?NPRS scale: 0-1/10 ?Pain location: Rt  low back, buttock, lateral hip into thigh and leg ?Pain orientation: Right  ?PAIN TYPE: aching, dull, and throbbing ?Pain description: intermittent  ?Aggravating factors: bending (I have a reacher and try not to bend) ?Relieving factors: lying down, pain patches ? ? ?OBJECTIVE:  ?  ?DIAGNOSTIC FINDINGS:  ?Scoliosis, signif OA and DDD lumbar spine, anterolisthesis L4 on L5, OA bil hips ?  ?PATIENT SURVEYS:  ?FOTO 40%, goal 50% ?07/05/21: 56%, met goal ?  ?  ?COGNITION: ?         Overall cognitive status: Within functional limits for tasks assessed              ?          ?  ?MUSCLE LENGTH: ?Hamstrings: Right 65 deg; Left 65 deg ?Limited piriformis bil 30% ?Limited adductors Lt>Rt ?  ?POSTURE:  ?Excessive thoracic kyphosis, scoliosis (mild) ?  ?PALPATION: ?Signif tenderness bil greater trochanter and glut tendons, Rt SI joint ?  ?LUMBARAROM/PROM ?  ?A/PROM A/PROM  ?05/26/2021  ?Flexion Limited 30%  ?Extension Not tested  ?Right lateral flexion Limited 50%, pain  ?Left lateral flexion Limited 50%, pain  ?Right rotation Limited 75%  ?Left rotation Limited 75%  ? (Blank rows = not tested) ?  ?LE AROM/PROM: ?Lt hip ER 6 deg ?Rt hip ER 20 deg ?Bil hip flexion 95 deg ?  ?LE MMT: ?Bil hips 4-/5 ?Bil knees 4/5 ?Ankle DF 5/5 ?  ?  ?  FUNCTIONAL TESTS:  ?5 times sit to stand: 17 ?Timed up and go (TUG): 15  with 3-wheeled rollator ?  ?GAIT: ?Distance walked: n/a ?Assistive device utilized:  3-wheeled rollator ?Level of assistance: Complete Independence ?Comments: appears in good control with 3-wheeled rollator ?  ?  ?  ?TODAY'S TREATMENT  ?07/14/21 ?NuStep L3 x 6' PT present to discuss progress/symptoms ?Gait with 3-wheeled walker 2 laps around ortho and cancer rehab gyms ?Standing on balance pad with UE support 3 rounds of SLS with 5 march taps to 6" step ?Standing row red band 1x10 with supervision for safety ?LAQ in chair 2x10 ?Seated heel/toe raises 2x10 ?Standing at barre hip abd and ext 2x5 each, bil UE support ?Seated ball  squeeze 10x5" holds ?Seated clam blue loop band x 15 ?Sit to stand from chair without pad x 1 reps today, with pad 1x10 ? ?07/12/21 ?NuStep L3 x 6' PT present to discuss progress/symptoms ?Along barre with UE support: sidestepping x 2 laps, high knee march fwd bwd x 2 laps ?Standing heel toe raises x 10 ?Seated LAQ 2x10 (no ankle weight due to pain set back after last visit) ?Sit to stand chair + pad 2x10 ?Gait with 3-wheeled walker 2 laps around ortho and cancer rehab gyms ?March toe taps to 6" stair x 8 alt Rt/Lt with bil UE support ? ?07/07/21 ?NuStep L4 x 6' seat 7 PT present to discuss symptoms and progress ?LAQ from chair 2x10, added 1.5lb ankle weight today ?Seated march 1.5lb ankle weight, alt Lt/Rt 2x20 ?Hold 5lb sit to stand chair + pad 1x10 ?Seated heel/toe raise x 20 ?Sidestepping along barre with bil UE support, VC to avoid ER of leading LE ?Standing toe raise with bil UE support 1x10 ?Standing heel raise with bil UE support 1x10 ?Seated ball squeeze 10x3" hold ?Seated clam blue loop band x 15 ?Supine Rt SKTC x 5 reps ?Supine Rt piriformis stretch 1x20" ?Gait with 3-wheeled walker x 1 lap around gym ? ?  ?PATIENT EDUCATION:  ?Education details: Access Code: [email or web address] ?Person educated: Patient ?Education method: Explanation, Demonstration, Tactile cues, Verbal cues, and Handouts ?Education comprehension: verbalized understanding, returned demonstration, and verbal cues required ?  ?  ?HOME EXERCISE PROGRAM: ?Access Code: [email or web address] ?URL: [email or web address] ?Date: 05/26/2021 ?Prepared by: Venetia Night Kincaid Tiger ?  ?Exercises ?Sit to Stand with Armchair - 3 x daily - 7 x weekly - 1 sets - 5 reps ?Seated Hamstring Stretch - 3 x daily - 7 x weekly - 1 sets - 1 reps - 20 hold ?Supine Piriformis Stretch - 2 x daily - 7 x weekly - 3 sets - 10 reps ?  ?  ?ASSESSMENT: ?  ?CLINICAL IMPRESSION: ? Pt reports she did better after last session and hasn't had any big pain this week.  She notices she can be on her  feet much longer with less pain now.  She was able to demo sit to stand from chair without pad for first time today and noted she did it at home as well this week.  Pt needed to sit after standing barre hip 2-way due to dizziness and pressure in her head but was able to perform seated therex and did recover back to baseline within session.  PT closely monitored and and provided supervision for safety with position and activity transitions. ?  ?  ?OBJECTIVE IMPAIRMENTS decreased balance, decreased ROM, decreased strength, hypomobility, impaired flexibility, improper body mechanics, postural dysfunction, and pain.  ?  ?ACTIVITY LIMITATIONS cleaning, community activity, driving, meal prep,  laundry, and shopping.  ?  ?PERSONAL FACTORS Age and Time since onset of injury/illness/exacerbation are also affecting patient's functional outcome.  ?  ?  ?REHAB POTENTIAL: Good ?  ?CLINICAL DECISION MAKING: Stable/uncomplicated ?  ?EVALUATION COMPLEXITY: Low ?  ?  ?GOALS: ?Goals reviewed with patient? Yes ?  ?SHORT TERM GOALS: ?  ?Pt will be ind with initial HEP without exacerbation of symptoms ?Baseline:  ?Target date: 06/23/2021 ?Goal status: MET ?  ?2.  Pt will demo safety with 3-wheeled rollator with 3' walk test navigating around obstacles in clinic. ?Baseline: safe x 2 trials over 2 visits, 4' and 5.5' ?Target date: 06/23/2021 ?Goal status: MET ?  ?3.  Pt will look into 4-wheeled rollator to see if she feels more stable and can lift it into and from car ?Baseline: trialed at [facility], too heavy for car transfers ?Target date: 06/23/2021 ?Goal status: MET ?  ?4.  Pt will report improved tolerance of daily activities with at least 20% reduction in pain. ?Baseline:  ?Target date: 06/23/2021 ?Goal status: MET ?  ?5.  Improve 5x sit to stand to 15 sec or less to demo improved functional strength and less fall risk. ?Baseline: 17 sec ?Target date: 07/21/2021 ?Goal status: MET FROM ELEVATED SURFACE ?  ?  ?  ?LONG TERM GOALS: ?  ?Pt will  be safe and ind with HEP ?Baseline:  ?Target date: 07/21/2021 ?Goal status: ONGOING ?  ?2.  Pt will be able to participate in 6' walk test with rollator to demo safety and improved endurance with no more tha

## 2021-07-19 ENCOUNTER — Ambulatory Visit: Payer: PPO | Attending: Family Medicine | Admitting: Physical Therapy

## 2021-07-19 ENCOUNTER — Encounter: Payer: Self-pay | Admitting: Physical Therapy

## 2021-07-19 DIAGNOSIS — M25551 Pain in right hip: Secondary | ICD-10-CM | POA: Insufficient documentation

## 2021-07-19 DIAGNOSIS — M6281 Muscle weakness (generalized): Secondary | ICD-10-CM | POA: Insufficient documentation

## 2021-07-19 DIAGNOSIS — G8929 Other chronic pain: Secondary | ICD-10-CM | POA: Insufficient documentation

## 2021-07-19 DIAGNOSIS — R293 Abnormal posture: Secondary | ICD-10-CM | POA: Diagnosis not present

## 2021-07-19 DIAGNOSIS — M545 Low back pain, unspecified: Secondary | ICD-10-CM | POA: Insufficient documentation

## 2021-07-19 NOTE — Therapy (Signed)
?OUTPATIENT PHYSICAL THERAPY TREATMENT NOTE ? ? ?Patient Name: Susan Marks ?MRN: [id number] ?DOB:09-Sep-1925, 88 y.o., female ?Today's Date: 07/19/2021 ? ?PCP: Jonathon Jordan, MD ?REFERRING PROVIDER: Jonathon Jordan, MD ? ? PT End of Session - 07/19/21 1020   ? ? Visit Number 9   ? Date for PT Re-Evaluation 07/21/21   ? Authorization Type [employer]   ? PT Start Time 1018   ? PT Stop Time 1056   ? PT Time Calculation (min) 38 min   ? Activity Tolerance Patient tolerated treatment well   ? Behavior During Therapy [facility] for tasks assessed/performed   ? ?  ?  ? ?  ? ? ? ?Past Medical History:  ?Diagnosis Date  ? Chronic headaches   ? migraines  ? Chronic kidney disease   ? High blood pressure   ? High cholesterol   ? ?Past Surgical History:  ?Procedure Laterality Date  ? VESICOVAGINAL FISTULA CLOSURE W/ TAH    ? ?Patient Active Problem List  ? Diagnosis Date Noted  ? LBBB (left bundle branch block) 03/30/2018  ? Diverticulitis of intestine with perforation without abscess 03/30/2018  ? Chest pain 03/10/2014  ? Benign essential HTN 03/10/2014  ? Abnormal EKG 03/10/2014  ? GERD (gastroesophageal reflux disease) 07/07/2013  ? Granulomatous lung disease ([place]) 07/07/2013  ? Cough 06/13/2013  ? ? ?REFERRING DIAG: M16.0 (ICD-10-CM) - Bilateral primary osteoarthritis of hip R29.6 (ICD-10-CM) - Repeated falls M47.816 (ICD-10-CM) - Spondylosis without myelopathy or radiculopathy, lumbar region  ? ?THERAPY DIAG:  ?Chronic right-sided low back pain, unspecified whether sciatica present ? ?Pain in right hip ? ?Muscle weakness (generalized) ? ?Abnormal posture ? ?PERTINENT HISTORY: falls, osteoporosis ? ?PRECAUTIONS: falls, osteoporosis ? ?SUBJECTIVE: Overall my pattern has been that I am much better, but I must have done something this morning and had to use ice about an hour ago and it is much better.   ? ?PAIN:  ? ?Are you having pain? No ?NPRS scale: 0-1/10 ?Pain location: Rt low back, buttock, lateral hip into thigh  and leg ?Pain orientation: Right  ?PAIN TYPE: aching, dull, and throbbing ?Pain description: intermittent  ?Aggravating factors: bending (I have a reacher and try not to bend) ?Relieving factors: lying down, pain patches ? ? ?OBJECTIVE:  ?  ?DIAGNOSTIC FINDINGS:  ?Scoliosis, signif OA and DDD lumbar spine, anterolisthesis L4 on L5, OA bil hips ?  ?PATIENT SURVEYS:  ?FOTO 40%, goal 50% ?07/05/21: 56%, met goal ?  ?  ?COGNITION: ?         Overall cognitive status: Within functional limits for tasks assessed              ?          ?  ?MUSCLE LENGTH: ?Hamstrings: Right 65 deg; Left 65 deg ?Limited piriformis bil 30% ?Limited adductors Lt>Rt ?  ?POSTURE:  ?Excessive thoracic kyphosis, scoliosis (mild) ?  ?PALPATION: ?Signif tenderness bil greater trochanter and glut tendons, Rt SI joint ?  ?LUMBARAROM/PROM ?  ?A/PROM A/PROM  ?05/26/2021  ?Flexion Limited 30%  ?Extension Not tested  ?Right lateral flexion Limited 50%, pain  ?Left lateral flexion Limited 50%, pain  ?Right rotation Limited 75%  ?Left rotation Limited 75%  ? (Blank rows = not tested) ?  ?LE AROM/PROM: ?Lt hip ER 6 deg ?Rt hip ER 20 deg ?Bil hip flexion 95 deg ?  ?LE MMT: ?Bil hips 4-/5 ?Bil knees 4/5 ?Ankle DF 5/5 ?  ?  ?FUNCTIONAL TESTS:  ?5 times sit to stand:  17 ?Timed up and go (TUG): 15  with 3-wheeled rollator ?  ?GAIT: ?Distance walked: n/a ?Assistive device utilized:  3-wheeled rollator ?Level of assistance: Complete Independence ?Comments: appears in good control with 3-wheeled rollator ?  ?  ?  ?TODAY'S TREATMENT  ?07/19/21 ?Gait with 3-wheeled walker 2 laps around ortho and cancer rehab gyms ?LAQ in chair 2x10 ?Standing on balance pad with UE support 3 rounds of SLS with 5 march taps to 6" step ?Standing row red band 1x10 with supervision for safety ?Sit to stand from chair without pad x 1 reps today, with pad 1x10 ?Seated ball squeeze 10x5" holds ?Seated clam blue loop band x 15 ?NuStep L3 seat 7 arms 10 x 5' PT present to monitor for hip  pain ?Standing heel raises with UE support x 15 ?Standing toe raises with UE support x 15 ? ? ?07/14/21 ?NuStep L3 x 6' PT present to discuss progress/symptoms ?Gait with 3-wheeled walker 2 laps around ortho and cancer rehab gyms ?Standing on balance pad with UE support 3 rounds of SLS with 5 march taps to 6" step ?Standing row red band 1x10 with supervision for safety ?LAQ in chair 2x10 ?Seated heel/toe raises 2x10 ?Standing at barre hip abd and ext 2x5 each, bil UE support ?Seated ball squeeze 10x5" holds ?Seated clam blue loop band x 15 ?Sit to stand from chair without pad x 1 reps today, with pad 1x10 ? ?07/12/21 ?NuStep L3 x 6' PT present to discuss progress/symptoms ?Along barre with UE support: sidestepping x 2 laps, high knee march fwd bwd x 2 laps ?Standing heel toe raises x 10 ?Seated LAQ 2x10 (no ankle weight due to pain set back after last visit) ?Sit to stand chair + pad 2x10 ?Gait with 3-wheeled walker 2 laps around ortho and cancer rehab gyms ?March toe taps to 6" stair x 8 alt Rt/Lt with bil UE support ? ?  ?PATIENT EDUCATION:  ?Education details: Access Code: [email or web address] ?Person educated: Patient ?Education method: Explanation, Demonstration, Tactile cues, Verbal cues, and Handouts ?Education comprehension: verbalized understanding, returned demonstration, and verbal cues required ?  ?  ?HOME EXERCISE PROGRAM: ?Access Code: [email or web address] ?URL: [email or web address] ?Date: 05/26/2021 ?Prepared by: Venetia Night Devine Dant ?  ?Exercises ?Sit to Stand with Armchair - 3 x daily - 7 x weekly - 1 sets - 5 reps ?Seated Hamstring Stretch - 3 x daily - 7 x weekly - 1 sets - 1 reps - 20 hold ?Supine Piriformis Stretch - 2 x daily - 7 x weekly - 3 sets - 10 reps ?  ?  ?ASSESSMENT: ?  ?CLINICAL IMPRESSION: ? Pt reports she is "better."  She has occasional episodes of pain in Rt hip that are improved with ice pack.  She is still unable to stand ind more than 1x from chair without pad without UE use.  She is nearly  ind with HEP and feels she should be ready to d/c next time if has handouts for all the exercises. ERO next time with d/c likely. ?  ?  ?OBJECTIVE IMPAIRMENTS decreased balance, decreased ROM, decreased strength, hypomobility, impaired flexibility, improper body mechanics, postural dysfunction, and pain.  ?  ?ACTIVITY LIMITATIONS cleaning, community activity, driving, meal prep, laundry, and shopping.  ?  ?PERSONAL FACTORS Age and Time since onset of injury/illness/exacerbation are also affecting patient's functional outcome.  ?  ?  ?REHAB POTENTIAL: Good ?  ?CLINICAL DECISION MAKING: Stable/uncomplicated ?  ?EVALUATION COMPLEXITY: Low ?  ?  ?GOALS: ?Goals reviewed with patient? Yes ?  ?  SHORT TERM GOALS: ?  ?Pt will be ind with initial HEP without exacerbation of symptoms ?Baseline:  ?Target date: 06/23/2021 ?Goal status: MET ?  ?2.  Pt will demo safety with 3-wheeled rollator with 3' walk test navigating around obstacles in clinic. ?Baseline: safe x 2 trials over 2 visits, 4' and 5.5' ?Target date: 06/23/2021 ?Goal status: MET ?  ?3.  Pt will look into 4-wheeled rollator to see if she feels more stable and can lift it into and from car ?Baseline: trialed at [facility], too heavy for car transfers ?Target date: 06/23/2021 ?Goal status: MET ?  ?4.  Pt will report improved tolerance of daily activities with at least 20% reduction in pain. ?Baseline:  ?Target date: 06/23/2021 ?Goal status: MET ?  ?5.  Improve 5x sit to stand to 15 sec or less to demo improved functional strength and less fall risk. ?Baseline: 17 sec ?Target date: 07/21/2021 ?Goal status: MET FROM ELEVATED SURFACE ?  ?  ?  ?LONG TERM GOALS: ?  ?Pt will be safe and ind with HEP ?Baseline:  ?Target date: 07/21/2021 ?Goal status: ONGOING ?  ?2.  Pt will be able to participate in 6' walk test with rollator to demo safety and improved endurance with no more than 1 seated brief break. ?Baseline:  ?Target date: 07/21/2021 ?Goal status: MET WITH 3-WHEEL WALKER ?  ?3.  Pt  will improve bil hip and knee strength to at least 4+/5 for improved safety with stairs, curbs, functional squatting and gait ?Baseline:  ?Target date: 07/21/2021 ?Goal status: ONGOING ?  ?4.  Improve FOTO score to

## 2021-07-21 ENCOUNTER — Ambulatory Visit: Payer: PPO | Admitting: Physical Therapy

## 2021-07-21 ENCOUNTER — Encounter: Payer: Self-pay | Admitting: Physical Therapy

## 2021-07-21 DIAGNOSIS — M6281 Muscle weakness (generalized): Secondary | ICD-10-CM

## 2021-07-21 DIAGNOSIS — M545 Low back pain, unspecified: Secondary | ICD-10-CM | POA: Diagnosis not present

## 2021-07-21 DIAGNOSIS — M25551 Pain in right hip: Secondary | ICD-10-CM

## 2021-07-21 NOTE — Therapy (Signed)
?OUTPATIENT PHYSICAL THERAPY TREATMENT NOTE ? ? ?Patient Name: Susan Marks ?MRN: [id number] ?DOB:[birth year], 88 y.o., female ?Today's Date: 07/21/2021 ? ?PCP: Jonathon Jordan, MD ?REFERRING PROVIDER: Jonathon Jordan, MD ? ? PT End of Session - 07/21/21 1013   ? ? Visit Number 10   ? Date for PT Re-Evaluation 07/21/21   ? Authorization Type [employer]   ? PT Start Time 1013   ? PT Stop Time 1058   ? PT Time Calculation (min) 45 min   ? Activity Tolerance Patient tolerated treatment well   ? Behavior During Therapy [facility] for tasks assessed/performed   ? ?  ?  ? ?  ? ?Progress Note ?Reporting Period 05/26/21 to 07/21/21 ? ?See note below for Objective Data and Assessment of Progress/Goals.  ? ?  ? ? ?Past Medical History:  ?Diagnosis Date  ? Chronic headaches   ? migraines  ? Chronic kidney disease   ? High blood pressure   ? High cholesterol   ? ?Past Surgical History:  ?Procedure Laterality Date  ? VESICOVAGINAL FISTULA CLOSURE W/ TAH    ? ?Patient Active Problem List  ? Diagnosis Date Noted  ? LBBB (left bundle branch block) 03/30/2018  ? Diverticulitis of intestine with perforation without abscess 03/30/2018  ? Chest pain 03/10/2014  ? Benign essential HTN 03/10/2014  ? Abnormal EKG 03/10/2014  ? GERD (gastroesophageal reflux disease) 07/07/2013  ? Granulomatous lung disease ([place]) 07/07/2013  ? Cough 06/13/2013  ? ? ?REFERRING DIAG: M16.0 (ICD-10-CM) - Bilateral primary osteoarthritis of hip R29.6 (ICD-10-CM) - Repeated falls M47.816 (ICD-10-CM) - Spondylosis without myelopathy or radiculopathy, lumbar region  ? ?THERAPY DIAG:  ?Chronic right-sided low back pain, unspecified whether sciatica present ? ?Pain in right hip ? ?Muscle weakness (generalized) ? ?PERTINENT HISTORY: falls, osteoporosis ? ?PRECAUTIONS: falls, osteoporosis ? ?SUBJECTIVE: Overall my pattern has been that I am much better, but I started having some pain this morning after breakfast.  It will go away.  I am able to sleep much better  now. ? ?PAIN:  ? ?Are you having pain? No ?NPRS scale: 6/10 but this is a spike of pain, usually 0-1/10 ?Pain location: Rt low back, buttock, lateral hip into thigh and leg ?Pain orientation: Right  ?PAIN TYPE: aching, dull, and throbbing ?Pain description: intermittent  ?Aggravating factors: bending (I have a reacher and try not to bend) ?Relieving factors: lying down, pain patches ? ? ?OBJECTIVE:  ?  ?DIAGNOSTIC FINDINGS:  ?Scoliosis, signif OA and DDD lumbar spine, anterolisthesis L4 on L5, OA bil hips ?  ?PATIENT SURVEYS:  ?FOTO 40%, goal 50% ?07/05/21: 56%, met goal ?07/21/21: 63%, met/exceeded goal ?  ?  ?COGNITION: ?         Overall cognitive status: Within functional limits for tasks assessed              ?          ?  ?MUSCLE LENGTH: ?07/21/21: ?Hamstrings 75 deg bil ?Piriformis limited 20% on Rt ?Adductors WFL ? ?05/26/21: ?Hamstrings: Right 65 deg; Left 65 deg ?Limited piriformis bil 30% ?Limited adductors Lt>Rt ?  ?POSTURE:  ?07/21/21: ?Improved upright posture awareness, scoliosis (mild) ? ?05/26/21: ?Excessive thoracic kyphosis, scoliosis (mild) ?  ?PALPATION: ?07/21/21: ?Mild/mod tenderness Rt glut tendon and bursa ? ?05/26/21: ?Signif tenderness bil greater trochanter and glut tendons, Rt SI joint ?  ?LUMBARAROM/PROM ?  ?A/PROM A/PROM  ?05/26/2021 A/ROM  ?Flexion Limited 30% WFL  ?Extension Not tested Not tested  ?Right lateral flexion Limited 50%,  pain Limited 20% no pain  ?Left lateral flexion Limited 50%, pain Limited 20% no pain  ?Right rotation Limited 75% Limited 50%  ?Left rotation Limited 75% Limited 50%  ? (Blank rows = not tested) ?  ?LE AROM/PROM: ?07/21/21: ?Rt hip ROM WFL ? ?05/26/21: ?Lt hip ER 6 deg ?Rt hip ER 20 deg ?Bil hip flexion 95 deg ?  ?LE MMT: ?07/21/21: ?Rt hip flexion 4/5, abd 4/5, add 4+/5 ?Rt knee 4+/5 ? ?05/26/21: ?Bil hips 4-/5 ?Bil knees 4/5 ?Ankle DF 5/5 ?  ?  ?FUNCTIONAL TESTS:  ?07/21/21: ?5 times sit to stand 15 sec with UE assist ? ?05/26/21: ?5 times sit to stand: 17 sec with UE assist,  unable to rise without UE assist ?Timed up and go (TUG): 15  with 3-wheeled rollator ?  ?GAIT: ?Distance walked: n/a ?Assistive device utilized:  3-wheeled rollator ?Level of assistance: Complete Independence ?Comments: appears in good control with 3-wheeled rollator ?  ?  ?  ?TODAY'S TREATMENT  ?07/21/21 ?Gait with 3-wheeled walker 1.5 laps around ortho and cancer rehab gyms to warm up, PT present to supervise ?Seated with ice pack to Rt hip x 8' while doing FOTO and reviewing goals ?2x5 sit to stand hands on thighs, no pad in chair ?Review of a single set of 10 each for HEP finalization: ?LAQ from chair ?Seated ball squeeze ?Clam with green tied tband ?Standing heel toe raises ?Standing counter march ?Sidestepping along counter ?Hip abd and ext Rt/Lt x 5 each ? ? ?07/19/21 ?Gait with 3-wheeled walker 2 laps around ortho and cancer rehab gyms ?LAQ in chair 2x10 ?Standing on balance pad with UE support 3 rounds of SLS with 5 march taps to 6" step ?Standing row red band 1x10 with supervision for safety ?Sit to stand from chair without pad x 1 reps today, with pad 1x10 ?Seated ball squeeze 10x5" holds ?Seated clam blue loop band x 15 ?NuStep L3 seat 7 arms 10 x 5' PT present to monitor for hip pain ?Standing heel raises with UE support x 15 ?Standing toe raises with UE support x 15 ? ? ?07/14/21 ?NuStep L3 x 6' PT present to discuss progress/symptoms ?Gait with 3-wheeled walker 2 laps around ortho and cancer rehab gyms ?Standing on balance pad with UE support 3 rounds of SLS with 5 march taps to 6" step ?Standing row red band 1x10 with supervision for safety ?LAQ in chair 2x10 ?Seated heel/toe raises 2x10 ?Standing at barre hip abd and ext 2x5 each, bil UE support ?Seated ball squeeze 10x5" holds ?Seated clam blue loop band x 15 ?Sit to stand from chair without pad x 1 reps today, with pad 1x10 ? ?  ?PATIENT EDUCATION:  ?Education details: Access Code: [email or web address] ?Person educated: Patient ?Education method: Explanation,  Demonstration, Tactile cues, Verbal cues, and Handouts ?Education comprehension: verbalized understanding, returned demonstration, and verbal cues required ?  ?  ?HOME EXERCISE PROGRAM: ?Access Code: [email or web address] ?URL: [email or web address] ?Date: 07/21/2021 ?Prepared by: Venetia Night Chakia Counts ? ?Exercises ?- Sit to Stand with Armchair  - 3 x daily - 7 x weekly - 1 sets - 5 reps ?- Seated Hamstring Stretch  - 3 x daily - 7 x weekly - 1 sets - 1 reps - 20 hold ?- Seated Long Arc Quad  - 1 x daily - 7 x weekly - 2 sets - 10 reps ?- Seated Isometric Hip Adduction with Ball  - 1 x daily - 7 x weekly - 3 sets - 10 reps ?- Seated Hip  Abduction with Resistance  - 1 x daily - 7 x weekly - 3 sets - 10 reps ?- Heel Toe Raises with Counter Support  - 1 x daily - 7 x weekly - 3 sets - 10 reps ?- Standing March with Counter Support  - 1 x daily - 7 x weekly - 1 sets - 20 reps ?- Side Stepping with Counter Support  - 1 x daily - 7 x weekly - 3 sets - 10 reps ?  ?  ?ASSESSMENT: ?  ?CLINICAL IMPRESSION: ? Pt reports she is "better."  She has occasional episodes of pain, typically in the AM on waking, in Rt hip that are improved with ice pack, standing and walking.  Rt hip strength has improved to 4 to 4+/5.  She is still unable to perform sit to stand ind more than 1x from chair without pad without UE use.  She is safe and ind rising from chair with light UE use.  She continues to ambulate safely with 3-wheeled walker and prefers this over rollator due to rollator being too heavy for car transport.  She demonstrates ind with HEP and is ready to d/c to HEP at this time.  ?  ?  ?OBJECTIVE IMPAIRMENTS decreased balance, decreased ROM, decreased strength, hypomobility, impaired flexibility, improper body mechanics, postural dysfunction, and pain.  ?  ?ACTIVITY LIMITATIONS cleaning, community activity, driving, meal prep, laundry, and shopping.  ?  ?PERSONAL FACTORS Age and Time since onset of injury/illness/exacerbation are also  affecting patient's functional outcome.  ?  ?  ?REHAB POTENTIAL: Good ?  ?CLINICAL DECISION MAKING: Stable/uncomplicated ?  ?EVALUATION COMPLEXITY: Low ?  ?  ?GOALS: ?Goals reviewed with patient? Yes ?  ?SHORT TERM

## 2021-11-22 DIAGNOSIS — M810 Age-related osteoporosis without current pathological fracture: Secondary | ICD-10-CM | POA: Diagnosis not present

## 2021-11-24 DIAGNOSIS — M810 Age-related osteoporosis without current pathological fracture: Secondary | ICD-10-CM | POA: Diagnosis not present

## 2021-12-12 DIAGNOSIS — N1831 Chronic kidney disease, stage 3a: Secondary | ICD-10-CM | POA: Diagnosis not present

## 2021-12-12 DIAGNOSIS — R413 Other amnesia: Secondary | ICD-10-CM | POA: Diagnosis not present

## 2022-05-30 ENCOUNTER — Encounter (HOSPITAL_BASED_OUTPATIENT_CLINIC_OR_DEPARTMENT_OTHER): Payer: Self-pay

## 2022-05-30 ENCOUNTER — Emergency Department (HOSPITAL_BASED_OUTPATIENT_CLINIC_OR_DEPARTMENT_OTHER)
Admission: EM | Admit: 2022-05-30 | Discharge: 2022-05-30 | Disposition: A | Discharge status: Home / Self Care | Payer: PPO | Attending: Emergency Medicine | Admitting: Emergency Medicine

## 2022-05-30 ENCOUNTER — Emergency Department (HOSPITAL_BASED_OUTPATIENT_CLINIC_OR_DEPARTMENT_OTHER): Payer: PPO | Admitting: Radiology

## 2022-05-30 ENCOUNTER — Other Ambulatory Visit: Payer: Self-pay

## 2022-05-30 DIAGNOSIS — N189 Chronic kidney disease, unspecified: Secondary | ICD-10-CM | POA: Diagnosis not present

## 2022-05-30 DIAGNOSIS — U071 COVID-19: Secondary | ICD-10-CM | POA: Insufficient documentation

## 2022-05-30 DIAGNOSIS — I129 Hypertensive chronic kidney disease with stage 1 through stage 4 chronic kidney disease, or unspecified chronic kidney disease: Secondary | ICD-10-CM | POA: Diagnosis not present

## 2022-05-30 DIAGNOSIS — Z79899 Other long term (current) drug therapy: Secondary | ICD-10-CM | POA: Insufficient documentation

## 2022-05-30 DIAGNOSIS — Z7982 Long term (current) use of aspirin: Secondary | ICD-10-CM | POA: Diagnosis not present

## 2022-05-30 DIAGNOSIS — R059 Cough, unspecified: Secondary | ICD-10-CM | POA: Diagnosis not present

## 2022-05-30 LAB — RESP PANEL BY RT-PCR (RSV, FLU A&B, COVID)  RVPGX2
Influenza A by PCR: NEGATIVE
Influenza B by PCR: NEGATIVE
Resp Syncytial Virus by PCR: NEGATIVE
SARS Coronavirus 2 by RT PCR: POSITIVE — AB

## 2022-05-30 LAB — BASIC METABOLIC PANEL
Anion gap: 9 (ref 5–15)
BUN: 30 mg/dL — ABNORMAL HIGH (ref 8–23)
CO2: 25 mmol/L (ref 22–32)
Calcium: 10.4 mg/dL — ABNORMAL HIGH (ref 8.9–10.3)
Chloride: 102 mmol/L (ref 98–111)
Creatinine, Ser: 1.2 mg/dL — ABNORMAL HIGH (ref 0.44–1.00)
GFR, Estimated: 42 mL/min — ABNORMAL LOW (ref >60)
Glucose, Bld: 86 mg/dL (ref 70–99)
Potassium: 4.4 mmol/L (ref 3.5–5.1)
Sodium: 136 mmol/L (ref 135–145)

## 2022-05-30 MED ORDER — PAXLOVID (150/100) 10 X 150 MG & 10 X 100MG PO TBPK: 2.0000 | ORAL_TABLET | Freq: Two times a day (BID) | ORAL | 0 refills | Status: AC

## 2022-05-30 NOTE — ED Provider Notes (Signed)
[facility] Provider Note   CSN: [id number] Arrival date & time: 05/30/22  2004     History  Chief Complaint  Patient presents with   Cough    Susan Marks is a 89 y.o. female with a past medical history of CKD, hyperlipidemia, hypertension presents for cough.  Patient reports coughing for 2 days.  Denies any fever.  Today tested positive for COVID.  States she felt a lump in her neck when swallowing.  States one of her family members got COVID in the past and was treated with Paxlovid so she requested similar treatment.   Cough   Past Medical History:  Diagnosis Date   Chronic headaches    migraines   Chronic kidney disease    High blood pressure    High cholesterol    Past Surgical History:  Procedure Laterality Date   VESICOVAGINAL FISTULA CLOSURE W/ TAH       Home Medications Prior to Admission medications   Medication Sig Start Date End Date Taking? Authorizing Provider  nirmatrelvir & ritonavir (PAXLOVID, 150/100,) 10 x 150 MG & 10 x '100MG'$  TBPK Take 2 tablets by mouth 2 (two) times daily for 5 days. 05/30/22 06/04/22 Yes Rex Kras, PA  aspirin 81 MG tablet Take 81 mg by mouth daily.    [provider]  Calcium-Vitamin D-Vitamin K (VIACTIV PO) Take 2 capsules by mouth daily.    [provider]  cholecalciferol (VITAMIN D) 400 units TABS tablet Take 400 Units by mouth.    [provider]  cyanocobalamin 2000 MCG tablet Take 2,000 mcg by mouth daily.    [provider]  denosumab (PROLIA) 60 MG/ML SOLN injection Inject 60 mg into the skin every 6 (six) months. Administer in upper arm, thigh, or abdomen    [provider]  famotidine (PEPCID) 40 MG tablet Take 40 mg by mouth daily.    [provider]  fenofibrate micronized (LOFIBRA) 200 MG capsule Take 200 mg by mouth at bedtime.    [provider]  ibuprofen (ADVIL,MOTRIN) 200 MG tablet Take 400 mg by mouth as  needed for moderate pain.     [provider]  losartan (COZAAR) 100 MG tablet TAKE 1 TABLET BY MOUTH EVERY DAY 09/16/14   Sueanne Margarita, MD  polyethylene glycol (MIRALAX / GLYCOLAX) packet Take 17 g by mouth daily as needed for mild constipation.     [provider]      Allergies    Sulfa antibiotics, Asa [aspirin], Lisinopril, Other, and Statins    Review of Systems   Review of Systems  Respiratory:  Positive for cough.     Physical Exam Updated Vital Signs BP (!) 199/87 (BP Location: Right Arm)   Pulse 61   Temp 97.8 F (36.6 C)   Resp 20   Ht '4\' 11"'$  (1.499 m)   Wt 56.7 kg   SpO2 100%   BMI 25.25 kg/m  Physical Exam Vitals and nursing note reviewed.  Constitutional:      Appearance: Normal appearance.  HENT:     Head: Normocephalic and atraumatic.     Mouth/Throat:     Mouth: Mucous membranes are moist.  Eyes:     General: No scleral icterus. Cardiovascular:     Rate and Rhythm: Normal rate and regular rhythm.     Pulses: Normal pulses.     Heart sounds: Normal heart sounds.  Pulmonary:     Effort: Pulmonary effort  is normal.     Breath sounds: Rales present.  Abdominal:     General: Abdomen is flat.     Palpations: Abdomen is soft.     Tenderness: There is no abdominal tenderness.  Musculoskeletal:        General: No deformity.  Skin:    General: Skin is warm.     Findings: No rash.  Neurological:     General: No focal deficit present.     Mental Status: She is alert.  Psychiatric:        Mood and Affect: Mood normal.     ED Results / Procedures / Treatments   Labs (all labs ordered are listed, but only abnormal results are displayed) Labs Reviewed  RESP PANEL BY RT-PCR (RSV, FLU A&B, COVID)  RVPGX2 - Abnormal; Notable for the following components:      Result Value   SARS Coronavirus 2 by RT PCR POSITIVE (*)    All other components within normal limits  BASIC METABOLIC PANEL - Abnormal; Notable for the following components:    BUN 30 (*)    Creatinine, Ser 1.20 (*)    Calcium 10.4 (*)    GFR, Estimated 42 (*)    All other components within normal limits    EKG None  Radiology DG Chest 2 View  Result Date: 05/30/2022 CLINICAL DATA:  Cough, COVID positive. EXAM: CHEST - 2 VIEW COMPARISON:  12/10/2012. FINDINGS: The heart size and mediastinal contours are stable. Calcified lymph nodes are noted in the mediastinum and hilar regions bilaterally. There is a stable calcified granuloma in the mid to lower right lung. There stable elevation of the left diaphragm with subsegmental atelectasis or scarring at the left lung base. No effusion or pneumothorax. Degenerative changes are noted in the thoracic spine. No acute osseous abnormality. IMPRESSION: Subsegmental atelectasis or scarring at the left lung base. Electronically Signed   By: Brett Fairy M.D.   On: 05/30/2022 21:38    Procedures Procedures    Medications Ordered in ED Medications - No data to display  ED Course/ Medical Decision Making/ A&P                             Medical Decision Making Amount and/or Complexity of Data Reviewed Labs: ordered. Radiology: ordered.   This patient presents to the ED for sore throat, cough, this involves an extensive number of treatment options, and is a complaint that carries with a high risk of complications and morbidity.  The differential diagnosis includes flu, COVID, RSV, strep, pharyngitis, bronchitis, pneumonia, infectious etiology.  This is not an exhaustive list.  Lab tests: I ordered and personally interpreted labs.  The pertinent results include: Viral panel positive for flu.  Imaging studies:  Problem list/ ED course/ Critical interventions/ Medical management: HPI: See above Vital signs within normal range and stable throughout visit. Laboratory/imaging studies significant for: See above. On physical examination, patient is afebrile and appears in no acute distress. This patient presents with  symptoms suspicious for Covid. Based on history and physical doubt sinusitis. COVID test was positive. Do not suspect underlying cardiopulmonary process. I considered, but think unlikely, pneumonia. Patient is nontoxic appearing and not in need of emergent medical intervention. Patient told to self isolate at home until symptoms subside for 72 hours. Recommended patient to take TheraFlu or Mucinex for symptom relief.  I will send an Rx of Paxlovid.  Follow-up with primary care physician for  further evaluation and management.  Return to the ER if new or worsening symptoms. I have reviewed the patient home medicines and have made adjustments as needed.  Cardiac monitoring/EKG: The patient was maintained on a cardiac monitor.  I personally reviewed and interpreted the cardiac monitor which showed an underlying rhythm of: sinus rhythm.  Additional history obtained: External records from outside source obtained and reviewed including: Chart review including previous notes, labs, imaging.  Consultations obtained:  Disposition Continued outpatient therapy. Follow-up with PCP recommended for reevaluation of symptoms. Treatment plan discussed with patient.  Pt acknowledged understanding was agreeable to the plan. Worrisome signs and symptoms were discussed with patient, and patient acknowledged understanding to return to the ED if they noticed these signs and symptoms. Patient was stable upon discharge.   This chart was dictated using voice recognition software.  Despite best efforts to proofread,  errors can occur which can change the documentation meaning.          Final Clinical Impression(s) / ED Diagnoses Final diagnoses:  COVID    Rx / DC Orders ED Discharge Orders          Ordered    nirmatrelvir & ritonavir (PAXLOVID, 150/100,) 10 x 150 MG & 10 x 100MG  TBPK  2 times daily        05/30/22 2159              Rex Kras, PA 05/31/22 0006    Lacretia Leigh, MD 06/02/22 [id number]

## 2022-05-30 NOTE — Discharge Instructions (Addendum)
You tested positive for Covid today. Please take your medications as prescribed. Take tylenol/ibuprofen for fever. I recommend close follow-up with PCP for reevaluation.  Please do not hesitate to return to emergency department if worrisome signs symptoms we discussed become apparent.

## 2022-05-30 NOTE — ED Provider Notes (Incomplete)
[facility] Provider Note   CSN: [id number] Arrival date & time: 05/30/22  2004     History {Add pertinent medical, surgical, social history, OB history to HPI:1} Chief Complaint  Patient presents with  . Cough    Susan Marks is a 89 y.o. female with a past medical history of CKD, hyperlipidemia, hypertension presents for cough.  Patient reports coughing for 2 days.  Denies any fever.  Today tested positive for COVID.  States she felt a lump in her neck when swallowing.  States one of her family members got COVID in the past and was treated with Paxlovid so she requested similar treatment.   Cough   Past Medical History:  Diagnosis Date  . Chronic headaches    migraines  . Chronic kidney disease   . High blood pressure   . High cholesterol    Past Surgical History:  Procedure Laterality Date  . VESICOVAGINAL FISTULA CLOSURE W/ TAH       Home Medications Prior to Admission medications   Medication Sig Start Date End Date Taking? Authorizing Provider  aspirin 81 MG tablet Take 81 mg by mouth daily.    [provider]  Calcium-Vitamin D-Vitamin K (VIACTIV PO) Take 2 capsules by mouth daily.    [provider]  cholecalciferol (VITAMIN D) 400 units TABS tablet Take 400 Units by mouth.    [provider]  cyanocobalamin 2000 MCG tablet Take 2,000 mcg by mouth daily.    [provider]  denosumab (PROLIA) 60 MG/ML SOLN injection Inject 60 mg into the skin every 6 (six) months. Administer in upper arm, thigh, or abdomen    [provider]  famotidine (PEPCID) 40 MG tablet Take 40 mg by mouth daily.    [provider]  fenofibrate micronized (LOFIBRA) 200 MG capsule Take 200 mg by mouth at bedtime.    [provider]  ibuprofen (ADVIL,MOTRIN) 200 MG tablet Take 400 mg by mouth as needed for moderate pain.     [provider]  losartan (COZAAR) 100 MG tablet  TAKE 1 TABLET BY MOUTH EVERY DAY 09/16/14   Sueanne Margarita, MD  polyethylene glycol (MIRALAX / GLYCOLAX) packet Take 17 g by mouth daily as needed for mild constipation.     [provider]      Allergies    Sulfa antibiotics, Asa [aspirin], Lisinopril, Other, and Statins    Review of Systems   Review of Systems  Respiratory:  Positive for cough.     Physical Exam Updated Vital Signs BP (!) 199/87 (BP Location: Right Arm)   Pulse 61   Temp 97.8 F (36.6 C)   Resp 20   Ht '4\' 11"'$  (1.499 m)   Wt 56.7 kg   SpO2 100%   BMI 25.25 kg/m  Physical Exam Vitals and nursing note reviewed.  Constitutional:      Appearance: Normal appearance.  HENT:     Head: Normocephalic and atraumatic.     Mouth/Throat:     Mouth: Mucous membranes are moist.  Eyes:     General: No scleral icterus. Cardiovascular:     Rate and Rhythm: Normal rate and regular rhythm.     Pulses: Normal pulses.     Heart sounds: Normal heart sounds.  Pulmonary:     Effort: Pulmonary effort is normal.     Breath sounds: Rales present.  Abdominal:     General: Abdomen is flat.  Palpations: Abdomen is soft.     Tenderness: There is no abdominal tenderness.  Musculoskeletal:        General: No deformity.  Skin:    General: Skin is warm.     Findings: No rash.  Neurological:     General: No focal deficit present.     Mental Status: She is alert.  Psychiatric:        Mood and Affect: Mood normal.     ED Results / Procedures / Treatments   Labs (all labs ordered are listed, but only abnormal results are displayed) Labs Reviewed  RESP PANEL BY RT-PCR (RSV, FLU A&B, COVID)  RVPGX2  BASIC METABOLIC PANEL    EKG None  Radiology No results found.  Procedures Procedures  {Document cardiac monitor, telemetry assessment procedure when appropriate:1}  Medications Ordered in ED Medications - No data to display  ED Course/ Medical Decision Making/ A&P   {   Click here for ABCD2, HEART and  other calculatorsREFRESH Note before signing :1}                          Medical Decision Making Amount and/or Complexity of Data Reviewed Labs: ordered. Radiology: ordered.   This patient presents to the ED for sore throat, cough, this involves an extensive number of treatment options, and is a complaint that carries with a high risk of complications and morbidity.  The differential diagnosis includes flu, COVID, RSV, strep, pharyngitis, bronchitis, pneumonia, infectious etiology.  This is not an exhaustive list.  Lab tests: I ordered and personally interpreted labs.  The pertinent results include: Viral panel positive for flu.  Imaging studies:  Problem list/ ED course/ Critical interventions/ Medical management: HPI: See above Vital signs within normal range and stable throughout visit. Laboratory/imaging studies significant for: See above. On physical examination, patient is afebrile and appears in no acute distress. This patient presents with symptoms suspicious for Covid. Based on history and physical doubt sinusitis. COVID test was positive. Do not suspect underlying cardiopulmonary process. I considered, but think unlikely, pneumonia. Patient is nontoxic appearing and not in need of emergent medical intervention. Patient told to self isolate at home until symptoms subside for 72 hours. Recommended patient to take TheraFlu or Mucinex for symptom relief.  I will send an Rx of Paxlovid.  Follow-up with primary care physician for further evaluation and management.  Return to the ER if new or worsening symptoms. I have reviewed the patient home medicines and have made adjustments as needed.  Cardiac monitoring/EKG: The patient was maintained on a cardiac monitor.  I personally reviewed and interpreted the cardiac monitor which showed an underlying rhythm of: sinus rhythm.  Additional history obtained: External records from outside source obtained and reviewed including: Chart review  including previous notes, labs, imaging.  Consultations obtained:  Disposition Continued outpatient therapy. Follow-up with PCP recommended for reevaluation of symptoms. Treatment plan discussed with patient.  Pt acknowledged understanding was agreeable to the plan. Worrisome signs and symptoms were discussed with patient, and patient acknowledged understanding to return to the ED if they noticed these signs and symptoms. Patient was stable upon discharge.   This chart was dictated using voice recognition software.  Despite best efforts to proofread,  errors can occur which can change the documentation meaning.    {Document critical care time when appropriate:1} {Document review of labs and clinical decision tools ie heart score, Chads2Vasc2 etc:1}  {Document your independent review of radiology  images, and any outside records:1} {Document your discussion with family members, caretakers, and with consultants:1} {Document social determinants of health affecting pt's care:1} {Document your decision making why or why not admission, treatments were needed:1} Final Clinical Impression(s) / ED Diagnoses Final diagnoses:  None    Rx / DC Orders ED Discharge Orders     None

## 2022-05-30 NOTE — ED Provider Notes (Signed)
I provided a substantive portion of the care of this patient.  I personally made/approved the management plan for this patient and take responsibility for the patient management.      89 year old patient presents after testing positive for COVID at home.  She is confirmed COVID-positive here.  Chest x-ray did not show any acute findings.  Patient is alert and nontoxic-appearing.  Vital signs are stable.  Placed on Paxlovid and sent home   Lacretia Leigh, MD 05/30/22 2148

## 2022-05-30 NOTE — ED Notes (Signed)
RN provided AVS using Teachback Method. Patient verbalizes understanding of Discharge Instructions. Opportunity for Questioning and Answers were provided by RN. Patient Discharged from ED in wheelchair to home with family.

## 2022-05-30 NOTE — ED Triage Notes (Signed)
Patient here POV from Home.  Endorses Sore Throat that began yesterday. Associated with Cough. Stated she came in because she was "nervous and don't feel tip-top". Family Member seeks Evaluation to receive Paxlovid.   Positive Home COVID-19 Test today shortly PTA.   NAD Noted during Triage. A&Ox4. GCS 15. Ambulatory.

## 2022-06-07 DIAGNOSIS — N1831 Chronic kidney disease, stage 3a: Secondary | ICD-10-CM | POA: Diagnosis not present

## 2022-06-07 DIAGNOSIS — K5909 Other constipation: Secondary | ICD-10-CM | POA: Diagnosis not present

## 2022-06-07 DIAGNOSIS — K649 Unspecified hemorrhoids: Secondary | ICD-10-CM | POA: Diagnosis not present

## 2022-06-07 DIAGNOSIS — K6289 Other specified diseases of anus and rectum: Secondary | ICD-10-CM | POA: Diagnosis not present

## 2022-06-12 DIAGNOSIS — U071 COVID-19: Secondary | ICD-10-CM | POA: Diagnosis not present

## 2022-06-12 DIAGNOSIS — R5383 Other fatigue: Secondary | ICD-10-CM | POA: Diagnosis not present

## 2022-06-30 DIAGNOSIS — E7800 Pure hypercholesterolemia, unspecified: Secondary | ICD-10-CM | POA: Diagnosis not present

## 2022-06-30 DIAGNOSIS — Z79899 Other long term (current) drug therapy: Secondary | ICD-10-CM | POA: Diagnosis not present

## 2022-06-30 DIAGNOSIS — E559 Vitamin D deficiency, unspecified: Secondary | ICD-10-CM | POA: Diagnosis not present

## 2022-06-30 DIAGNOSIS — Z9989 Dependence on other enabling machines and devices: Secondary | ICD-10-CM | POA: Diagnosis not present

## 2022-06-30 DIAGNOSIS — E538 Deficiency of other specified B group vitamins: Secondary | ICD-10-CM | POA: Diagnosis not present

## 2022-06-30 DIAGNOSIS — I10 Essential (primary) hypertension: Secondary | ICD-10-CM | POA: Diagnosis not present

## 2022-06-30 DIAGNOSIS — F325 Major depressive disorder, single episode, in full remission: Secondary | ICD-10-CM | POA: Diagnosis not present

## 2022-06-30 DIAGNOSIS — Z0000 Encounter for general adult medical examination without abnormal findings: Secondary | ICD-10-CM | POA: Diagnosis not present

## 2022-06-30 DIAGNOSIS — R7301 Impaired fasting glucose: Secondary | ICD-10-CM | POA: Diagnosis not present

## 2022-06-30 DIAGNOSIS — N183 Chronic kidney disease, stage 3 unspecified: Secondary | ICD-10-CM | POA: Diagnosis not present

## 2022-06-30 DIAGNOSIS — I700 Atherosclerosis of aorta: Secondary | ICD-10-CM | POA: Diagnosis not present

## 2022-06-30 DIAGNOSIS — E039 Hypothyroidism, unspecified: Secondary | ICD-10-CM | POA: Diagnosis not present

## 2022-06-30 DIAGNOSIS — Z23 Encounter for immunization: Secondary | ICD-10-CM | POA: Diagnosis not present

## 2022-09-08 DIAGNOSIS — G3184 Mild cognitive impairment, so stated: Secondary | ICD-10-CM | POA: Diagnosis not present

## 2022-09-08 DIAGNOSIS — Z9989 Dependence on other enabling machines and devices: Secondary | ICD-10-CM | POA: Diagnosis not present

## 2022-10-18 ENCOUNTER — Encounter: Payer: Self-pay | Admitting: Neurology

## 2022-10-18 ENCOUNTER — Ambulatory Visit: Payer: PPO | Admitting: Neurology

## 2022-10-18 ENCOUNTER — Telehealth: Payer: Self-pay | Admitting: Neurology

## 2022-10-18 VITALS — BP 159/82 | HR 81 | Ht <= 58 in | Wt 123.0 lb

## 2022-10-18 DIAGNOSIS — G3184 Mild cognitive impairment, so stated: Secondary | ICD-10-CM

## 2022-10-18 DIAGNOSIS — R4789 Other speech disturbances: Secondary | ICD-10-CM

## 2022-10-18 DIAGNOSIS — R413 Other amnesia: Secondary | ICD-10-CM

## 2022-10-18 MED ORDER — ALPRAZOLAM 0.25 MG PO TABS: ORAL_TABLET | ORAL | 0 refills | Status: DC

## 2022-10-18 MED ORDER — METHYLPREDNISOLONE 4 MG PO TBPK: ORAL_TABLET | ORAL | 1 refills | Status: DC

## 2022-10-18 MED ORDER — DONEPEZIL HCL 5 MG PO TABS: 5.0000 mg | ORAL_TABLET | Freq: Every day | ORAL | 0 refills | Status: DC

## 2022-10-18 NOTE — Patient Instructions (Addendum)
Start Donepezil/Aricept 5mg  if doing well mychart or call and we can increase to 10mg  in one month Blood and urine work today MRI of the brain w/wo contrast - open MRI and Xanax  Mild Neurocognitive Disorder Mild neurocognitive disorder, formerly known as mild cognitive impairment, is a disorder in which memory does not work as well as it should. This disorder may also cause problems with other mental functions, including thought, communication, behavior, and completion of tasks. These problems can be noticed and measured, but they usually do not interfere with daily activities or the ability to live independently. Mild neurocognitive disorder typically develops after 87 years of age, but it can also develop at younger ages. It is not as serious as major neurocognitive disorder, also known as dementia, but it may be the first sign of it. Generally, symptoms of this condition get worse over time. In rare cases, symptoms can get better. What are the causes? This condition may be caused by: Brain disorders like Alzheimer's disease, Parkinson's disease, and other conditions that gradually damage nerve cells (neurodegenerative conditions). Diseases that affect blood vessels in the brain and result in small strokes. Certain infections, such as HIV. Traumatic brain injury. Other medical conditions, such as brain tumors, underactive thyroid (hypothyroidism), and vitamin B12 deficiency. Use of certain drugs or prescription medicines. What increases the risk? The following factors may make you more likely to develop this condition: Being older than 65 years. Being female. Low education level. Diabetes, high blood pressure, high cholesterol, and other conditions that increase the risk for blood vessel diseases. Untreated or undertreated sleep apnea. Having a certain type of gene that can be passed from parent to child (inherited). Chronic health problems such as heart disease, lung disease, liver disease,  kidney disease, or depression. What are the signs or symptoms? Symptoms of this condition include: Difficulty remembering. You may: Forget names, phone numbers, or details of recent events. Forget social events and appointments. Repeatedly forget where you put your car keys or other items. Difficulty thinking and solving problems. You may have trouble with complex tasks, such as: Paying bills. Driving in unfamiliar places. Difficulty communicating. You may have trouble: Finding the right word or naming an object. Forming a sentence that makes sense, or understanding what you read or hear. Changes in your behavior or personality. When this happens, you may: Lose interest in the things that you used to enjoy. Withdraw from social situations. Get angry more easily than usual. Act before thinking. How is this diagnosed? This condition is diagnosed based on: Your symptoms. Your health care provider may ask you and the people you spend time with, such as family and friends, about your symptoms. Evaluation of mental functions (neuropsychological testing). Your health care provider may refer you to a neurologist or mental health specialist to evaluate your mental functions in detail. To identify the cause of your condition, your health care provider may: Get a detailed medical history. Ask about use of alcohol, drugs, and prescription medicines. Do a physical exam. Order blood tests and brain imaging exams. How is this treated? Mild neurocognitive disorder that is caused by medicine use, drug use, infection, or another medical condition may improve when the cause is treated, or when medicines or drugs are stopped. If this disorder has another cause, it generally does not improve and may get worse. In these cases, the goal of treatment is to help you manage the loss of mental function. Treatments in these cases include: Medicine. Medicine mainly helps memory  and behavior symptoms. Talk therapy.  Talk therapy provides education, emotional support, memory aids, and other ways of making up for problems with mental function. Lifestyle changes, including: Getting regular exercise. Eating a healthy diet that includes omega-3 fatty acids. Challenging your thinking and memory skills. Having more social interaction. Follow these instructions at home: Eating and drinking  Drink enough fluid to keep your urine pale yellow. Eat a healthy diet that includes omega-3 fatty acids. These can be found in: Fish. Nuts. Leafy vegetables. Vegetable oils. If you drink alcohol: Limit how much you use to: 0-1 drink a day for women. 0-2 drinks a day for men. Be aware of how much alcohol is in your drink. In the [place] one drink equals one 12 oz bottle of beer (355 mL), one 5 oz glass of wine (148 mL), or one 1 oz glass of hard liquor (44 mL). Lifestyle  Get regular exercise as told by your health care provider. Do not use any products that contain nicotine or tobacco, such as cigarettes, e-cigarettes, and chewing tobacco. If you need help quitting, ask your health care provider. Practice ways to manage stress. If you need help managing stress, ask your health care provider. Continue to have social interaction. Keep your mind active with stimulating activities you enjoy, such as reading or playing games. Make sure to get quality sleep. Follow these tips: Avoid napping during the day. Keep your sleeping area dark and cool. Avoid exercising during the few hours before you go to bed. Avoid caffeine products in the evening. General instructions Take over-the-counter and prescription medicines only as told by your health care provider. Your health care provider may recommend that you avoid taking medicines that can affect thinking, such as pain medicines or sleep medicines. Work with your health care provider to find out what you need help with and what your safety needs are. Keep all follow-up visits.  This is important. Where to find more information [employer] on Aging: https://walker.com/ Contact a health care provider if: You have any new symptoms. Get help right away if: You develop new confusion or your confusion gets worse. You act in ways that place you or your family in danger. Summary Mild neurocognitive disorder is a disorder in which memory does not work as well as it should. Mild neurocognitive disorder can have many causes. It may be the first stage of dementia. To manage your condition, get regular exercise, keep your mind active, get quality sleep, and eat a healthy diet. This information is not intended to replace advice given to you by your health care provider. Make sure you discuss any questions you have with your health care provider. Document Revised: 07/21/2019 Document Reviewed: 07/21/2019 Elsevier Patient Education  2024 [employer]  Donepezil Tablets What is this medication? DONEPEZIL (doe NEP e zil) treats memory loss and confusion (dementia) in people who have Alzheimer disease. It works by improving attention, memory, and the ability to engage in daily activities. It is not a cure for dementia or Alzheimer disease. This medicine may be used for other purposes; ask your health care provider or pharmacist if you have questions. COMMON BRAND NAME(S): Aricept What should I tell my care team before I take this medication? They need to know if you have any of these conditions: Asthma or other lung disease Difficulty passing urine Head injury Heart disease History of irregular heartbeat Liver disease Seizures (convulsions) Stomach or intestinal disease, ulcers or stomach bleeding An unusual or allergic reaction to donepezil,  other medications, foods, dyes, or preservatives Pregnant or trying to get pregnant Breast-feeding How should I use this medication? Take this medication by mouth with a glass of water. Follow the directions on the prescription  label. You may take this medication with or without food. Take this medication at regular intervals. This medication is usually taken before bedtime. Do not take it more often than directed. Continue to take your medication even if you feel better. Do not stop taking except on your care team's advice. If you are taking the 23 mg donepezil tablet, swallow it whole; do not cut, crush, or chew it. Talk to your care team about the use of this medication in children. Special care may be needed. Overdosage: If you think you have taken too much of this medicine contact a poison control center or emergency room at once. NOTE: This medicine is only for you. Do not share this medicine with others. What if I miss a dose? If you miss a dose, take it as soon as you can. If it is almost time for your next dose, take only that dose, do not take double or extra doses. What may interact with this medication? Do not take this medication with any of the following: Certain medications for fungal infections like itraconazole, fluconazole, posaconazole, and voriconazole Cisapride Dextromethorphan; quinidine Dronedarone Pimozide Quinidine Thioridazine This medication may also interact with the following: Antihistamines for allergy, cough and cold Atropine Bethanechol Carbamazepine Certain medications for bladder problems like oxybutynin, tolterodine Certain medications for Parkinson's disease like benztropine, trihexyphenidyl Certain medications for stomach problems like dicyclomine, hyoscyamine Certain medications for travel sickness like scopolamine Dexamethasone Dofetilide Ipratropium NSAIDs, medications for pain and inflammation, like ibuprofen or naproxen Other medications for Alzheimer's disease Other medications that prolong the QT interval (cause an abnormal heart rhythm) Phenobarbital Phenytoin Rifampin, rifabutin or rifapentine Ziprasidone This list may not describe all possible interactions.  Give your health care provider a list of all the medicines, herbs, non-prescription drugs, or dietary supplements you use. Also tell them if you smoke, drink alcohol, or use illegal drugs. Some items may interact with your medicine. What should I watch for while using this medication? Visit your care team for regular checks on your progress. Check with your care team if your symptoms do not get better or if they get worse. You may get drowsy or dizzy. Do not drive, use machinery, or do anything that needs mental alertness until you know how this medication affects you. What side effects may I notice from receiving this medication? Side effects that you should report to your care team as soon as possible: Allergic reactions--skin rash, itching, hives, swelling of the face, lips, tongue, or throat Peptic ulcer--burning stomach pain, loss of appetite, bloating, burping, heartburn, nausea, vomiting Seizures Slow heartbeat--dizziness, feeling faint or lightheaded, confusion, trouble breathing, unusual weakness or fatigue Stomach bleeding--bloody or black, tar-like stools, vomiting blood or brown material that looks like coffee grounds Trouble passing urine Side effects that usually do not require medical attention (report these to your care team if they continue or are bothersome): Diarrhea Fatigue Loss of appetite Muscle pain or cramps Nausea Trouble sleeping This list may not describe all possible side effects. Call your doctor for medical advice about side effects. You may report side effects to FDA at 1-800-FDA-1088. Where should I keep my medication? Keep out of reach of children. Store at room temperature between 15 and 30 degrees C (59 and 86 degrees F). Throw away any unused medication  after the expiration date. NOTE: This sheet is a summary. It may not cover all possible information. If you have questions about this medicine, talk to your doctor, pharmacist, or health care provider.   2024 Elsevier/Gold Standard (2020-10-20 00:00:00)  Xanax  Alprazolam Tablets What is this medication? ALPRAZOLAM (al PRAY zoe lam) treats anxiety. It works by Education administrator system calm down. It belongs to a group of medications called benzodiazepines. This medicine may be used for other purposes; ask your health care provider or pharmacist if you have questions. COMMON BRAND NAME(S): Xanax What should I tell my care team before I take this medication? They need to know if you have any of these conditions: Kidney disease Liver disease Lung disease, such as asthma or breathing problems Mental health condition Seizures Substance use disorder Suicidal thoughts, plans, or attempt by you or a family member An unusual or allergic reaction to alprazolam, other medications, foods, dyes, or preservatives Pregnant or trying to get pregnant Breastfeeding How should I use this medication? Take this medication by mouth. Take it as directed on the prescription label. Do not take it more often than directed. Keep taking it unless your care team tells you to stop. A special MedGuide will be given to you by the pharmacist with each prescription and refill. Be sure to read this information carefully each time. Talk to your care team about the use of this medication in children. Special care may be needed. People 65 years and older may have a stronger reaction and need a smaller dose. Overdosage: If you think you have taken too much of this medicine contact a poison control center or emergency room at once. NOTE: This medicine is only for you. Do not share this medicine with others. What if I miss a dose? If you miss a dose, take it as soon as you can. If it is almost time for your next dose, take only that dose. Do not take double or extra doses. What may interact with this medication? Do not take this medication with any of the following: Adagrasib Certain antivirals for HIV or  hepatitis Certain medications for fungal infections, such as ketoconazole, itraconazole, or posaconazole Clarithromycin Grapefruit juice Opioid medications for cough Sodium oxybate This medication may also interact with the following: Alcohol Antihistamines for allergy, cough and cold Certain medications for anxiety or sleep Certain medications for depression, such as amitriptyline, fluoxetine, fluvoxamine, nefazodone, sertraline Certain medications for seizures, such as carbamazepine, phenobarbital, phenytoin, primidone Cimetidine Digoxin Erythromycin Estrogen or progestin hormones General anesthetics, such as halothane, isoflurane, methoxyflurane, propofol Medications that relax muscles Opioid medications for pain Phenothiazines, such as chlorpromazine, mesoridazine, prochlorperazine, thioridazine This list may not describe all possible interactions. Give your health care provider a list of all the medicines, herbs, non-prescription drugs, or dietary supplements you use. Also tell them if you smoke, drink alcohol, or use illegal drugs. Some items may interact with your medicine. What should I watch for while using this medication? Visit your care team for regular checks on your progress. Tell your care team if your symptoms do not start to get better or if they get worse. Do not stop taking except on your care team's advice. You may develop a severe reaction. Your care team will tell you how much medication to take. Long term use of this medication may cause your brain and body to depend on it. This can happen even when used as directed by your care team. You and your care team will  work together to determine how long you will need to take this medication. This medication may affect your coordination, reaction time, or judgment. Do not drive or operate machinery until you know how this medication affects you. Sit up or stand slowly to reduce the risk of dizzy or fainting spells. Drinking  alcohol with this medication can increase the risk of these side effects. If you are taking another medication that also causes drowsiness, you may have more side effects. Give your care team a list of all medications you use. Your care team will tell you how much medication to take. Do not take more medication than directed. Get emergency help right away if you have problems breathing or unusual sleepiness. If you or your family notice any changes in your behavior, such as new or worsening depression, thoughts of harming yourself, anxiety, other unusual or disturbing thoughts, or memory loss, call your care team right away. Talk to your care team if you wish to become pregnant or think you might be pregnant. This medication can cause serious birth defects. Talk to your care team before breastfeeding. Changes to your treatment plan may be needed. What side effects may I notice from receiving this medication? Side effects that you should report to your care team as soon as possible: Allergic reactions--skin rash, itching, hives, swelling of the face, lips, tongue, or throat CNS depression--slow or shallow breathing, shortness of breath, feeling faint, dizziness, confusion, trouble staying awake Thoughts of suicide or self-harm, worsening mood, feelings of depression Side effects that usually do not require medical attention (report to your care team if they continue or are bothersome): Change in sex drive or performance Dizziness Drowsiness Nausea This list may not describe all possible side effects. Call your doctor for medical advice about side effects. You may report side effects to FDA at 1-800-FDA-1088. Where should I keep my medication? Keep out of the reach of children and pets. This medication can be abused. Keep it in a safe place to protect it from theft. Do not share it with anyone. It is only for you. Selling or giving away this medication is dangerous and against the law. Store at room  temperature between 20 and 25 degrees C (68 and 77 degrees F). Get rid of any unused medication after the expiration date. This medication may cause harm and death if it is taken by other adults, children, or pets. It is important to get rid of the medication as soon as you no longer need it, or it is expired. You can do this in two ways: Take the medication to a medication take-back program. Check with your pharmacy or law enforcement to find a location. If you cannot return the medication, check the label or package insert to see if the medication should be thrown out in the garbage or flushed down the toilet. If you are not sure, ask your care team. If it is safe to put it in the trash, take the medication out of the container. Mix the medication with cat litter, dirt, coffee grounds, or other unwanted substance. Seal the mixture in a bag or container. Put it in the trash. NOTE: This sheet is a summary. It may not cover all possible information. If you have questions about this medicine, talk to your doctor, pharmacist, or health care provider.  2024 Elsevier/Gold Standard (2021-08-05 00:00:00)

## 2022-10-18 NOTE — Telephone Encounter (Signed)
[employer] [facility] sent to [facility] for an open MRI. [phone]

## 2022-10-18 NOTE — Progress Notes (Signed)
[facility]    Provider:  Dr Lucia Gaskins Requesting Provider: Mila Palmer, MD Primary Care Provider:  Mila Palmer, MD  CC:  memory loss  HPI:  Susan Marks is a 89 y.o. female here as requested by Mila Palmer, MD for memory loss. has Cough; GERD (gastroesophageal reflux disease); Granulomatous lung disease (HCC); Chest pain; Benign essential HTN; Abnormal EKG; LBBB (left bundle branch block); and Diverticulitis of intestine with perforation without abscess on their problem list. She is here with a friend who also provides information. If she doesn't write it down, she forgets it like an appointment. More often she just can't bring it from her mind to her mouth to express a thought. For example she has a friend that spends time with her a few hours a week and she is having a difficult time finding her name, in a few minutes it comes back sometimes. She can't come up with a ord states her friend who provides much information. She couldn;t come up with a food she was trying to come up with and they had to list off selection and she got it. Been ongoing a few years, slowly progressive. She does not drive anymore. Lives with husband who is active. Lives in her own home, Her 2 children live here and they are helpful as well as the grandchildren. She was very political. She used to do the bills until a few years ago one of her sons comes every tueday and helps. She has POA.  More short-term memory. Friend had to handle an alarm system problem and she needed a friend to call because it was too difficult. Sisters are young (10 and 15 years younger no memory deficits), parents did not have dementia. She used to read a lot and forgets what she read. No depression or anxiety. No hallucinations or delusions. No other focal neurologic deficits, associated symptoms, inciting events or modifiable factors. Her friend is Marijean Niemann, she is an elder representative and sons hired her "Higher education careers adviser and Heart  " Matthias Hughs [phone]  [email or web address]   Reviewed notes from Mila Palmer: MMSE was 27 in 2021 and last 24-26. Today 20/30.    Reviewed notes, labs and imaging from outside physicians, which showed: reviewed Mila Palmer' notes:   89 year old female presents with c/o Patient complains of Memory issues. Started concerns about 3 years ago, MMSE was 27/30.          Here today with friend, Asher Muir.          Patient is a very pleasant woman, very well known to me for the past 15+ years. She has always been precise, accurate, taken notes and prepared on her visits. She  always had questions to ask.          In the past years, she has been worried about word-finding, forgetting what she was talking about when she segways, forgetting names. She has always been very smart, reads a lot of books.          Husband has been more worried due to his experience with a relative whose health declined rapidly after a diagnosis of dementia.          Asher Muir says that her husband mentioned that patient wakes up in the middle of the night confused and not knowing where she is. Patient says [place] is not true.          In the beginning of the visit, patient appeared anxious and had speech dysfluency. As  she relaxed, her memory got better and she was able to recall with simple reminders.          Today, she will continue to write words or names that matter a lot to her, we went over better concentration and being in the moment, and cutting back on side stories to help her stay on subject.    - Medical History: Hematuria, recurrent UTI, prolapsed bladder, Uro, Insomnia, Dizziness/vertigo, osteoporosis > was on Fosamax c/o Dr. Caryl Never > Dr. Nickola Major, started Prolia 2018, colon polyps, Dr. Loreta Ave (05/26/2002), LBP (Lumbar severe degen dse, TL LS by XR), hip pain (OA and bursitis) Dr. Derry Lory, Dr. Ethlyn Gallery, [place], Dr. Myna Bright, Vit D Deficiency, Hyperlipidemia - Crestor caused cramping. On fibrate., ABI  and PAD test by previous were normal per patient, DERM: Dr. Danella Deis, CKD Stage III, Anemia, diverticulosis/benign polyps, 09/2012 Dr. Loreta Ave, Subclinical hypothyroidism, 10/2011, chronic cough 11/2012, Pulmo/Dr. Maple Hudson, Cardio: Dr. Mayford Knife (does not want to go back to see her), had Cardiolite stress test on 03/08/2014 >LBBB, Dr. Anne Fu, DERM: Dr. Jorja Loa, Vit B12 def 12/2015 (neg MMA, TTG-A, IFA), prediabetes A1C 5.8 10/2017, HTN, contributing to CKD, Diverticulitis with microperf /no abscess, 03/2018, MCI (MMSE: 27 : 04/2019), MMSE 09/08/22 : 24, Herpes dermatitis, S3, Lumbar severe degen dse, aortic AS, abdominal XR.   Marland Kitchen  Review of Systems: Patient complains of symptoms per HPI as well as the following symptoms none. Pertinent negatives and positives per HPI. All others negative.   Social History   Socioeconomic History   Marital status: Married    Spouse name: Not on file   Number of children: 2   Years of education: Not on file   Highest education level: Not on file  Occupational History   Occupation: Camera operator  Tobacco Use   Smoking status: Never   Smokeless tobacco: Never  Vaping Use   Vaping status: Never Used  Substance and Sexual Activity   Alcohol use: Yes    Comment: occasional wine(red)   Drug use: No   Sexual activity: Not on file  Other Topics Concern   Not on file  Social History Narrative   Not on file   Social Determinants of Health   Financial Resource Strain: Not on file  Food Insecurity: Not on file  Transportation Needs: Not on file  Physical Activity: Not on file  Stress: Not on file  Social Connections: Not on file  Intimate Partner Violence: Not on file    Family History  Problem Relation Age of Onset   Heart disease Father    Heart murmur Sister    Glaucoma Sister    Allergies Sister     Past Medical History:  Diagnosis Date   Chronic headaches    migraines   Chronic kidney disease    High blood pressure    High cholesterol     Patient  Active Problem List   Diagnosis Date Noted   LBBB (left bundle branch block) 03/30/2018   Diverticulitis of intestine with perforation without abscess 03/30/2018   Chest pain 03/10/2014   Benign essential HTN 03/10/2014   Abnormal EKG 03/10/2014   GERD (gastroesophageal reflux disease) 07/07/2013   Granulomatous lung disease (HCC) 07/07/2013   Cough 06/13/2013    Past Surgical History:  Procedure Laterality Date   VESICOVAGINAL FISTULA CLOSURE W/ TAH      Current Outpatient Medications  Medication Sig Dispense Refill   acetaminophen (TYLENOL) 650 MG CR tablet 2 tablets daily with breakfast Orally May take 2 more  tablets as needed by the end of the day     ALPRAZolam (XANAX) 0.25 MG tablet Take 1-2 tabs (0.25mg -0.50mg ) 30-60 minutes before procedure. May repeat if needed.Do not drive. 4 tablet 0   Cholecalciferol 50 MCG (2000 UT) TABS Take 2,000 Units by mouth.     cyanocobalamin 1000 MCG tablet Take 1,000 mcg by mouth daily.     denosumab (PROLIA) 60 MG/ML SOLN injection Inject 60 mg into the skin every 6 (six) months. Administer in upper arm, thigh, or abdomen     donepezil (ARICEPT) 5 MG tablet Take 1 tablet (5 mg total) by mouth at bedtime. 30 tablet 0   famotidine (PEPCID) 40 MG tablet Take 40 mg by mouth daily.     fenofibrate micronized (LOFIBRA) 200 MG capsule Take 200 mg by mouth at bedtime.     lidocaine 4 % as directed Externally     losartan (COZAAR) 100 MG tablet TAKE 1 TABLET BY MOUTH EVERY DAY 30 tablet 3   No current facility-administered medications for this visit.    Allergies as of 10/18/2022 - Review Complete 10/18/2022  Allergen Reaction Noted   Sulfa antibiotics Other (See Comments) 05/26/2013   Asa [aspirin] Other (See Comments) 05/30/2022   Lisinopril  05/26/2013   Other Swelling 03/30/2018   Statins  05/26/2013    Vitals: BP (!) 159/82   Pulse 81   Ht 4' 1.32" (1.253 m)   Wt 123 lb (55.8 kg)   BMI 35.55 kg/m  Last Weight:  Wt Readings from  Last 1 Encounters:  10/18/22 123 lb (55.8 kg)   Last Height:   Ht Readings from Last 1 Encounters:  10/18/22 4' 1.32" (1.253 m)     Physical exam: Exam: Gen: NAD, conversant, well nourised, obese, well groomed                     CV: RRR, no MRG. No Carotid Bruits. No peripheral edema, warm, nontender Eyes: Conjunctivae clear without exudates or hemorrhage  Neuro: Detailed Neurologic Exam  Speech:    Speech is normal; fluent and spontaneous with normal comprehension.  Cognition:      10/18/2022   11:17 AM  MMSE - Mini Mental State Exam  Orientation to time 5  Orientation to Place 3  Registration 3  Attention/ Calculation 1  Recall 0  Language- name 2 objects 2  Language- repeat 1  Language- follow 3 step command 3  Language- read & follow direction 1  Write a sentence 1  Copy design 0  Total score 20    Cranial Nerves:    The pupils are equal, round, and reactive to light. Attempted, pupils too small to visualize fundi. Visual fields are full to finger confrontation. Extraocular movements are intact. Trigeminal sensation is intact and the muscles of mastication are normal. The face is symmetric. The palate elevates in the midline. Hearing impaired. Voice is normal. Shoulder shrug is normal. The tongue has normal motion without fasciculations.   Coordination:    Normal finger to nose and heel to shin..   Gait: Antalgic due to hip and knee and back pain, not parkinsonian  Motor Observation:    No asymmetry, no atrophy, and no involuntary movements noted. Tone:    Normal muscle tone.    Posture:    Posture is normal. normal erect    Strength: patient decline right leg due to pain otherwise strength is V/V in the upper and lower limbs.      Sensation:  intact to LT     Reflex Exam:  DTR's: abent ajs otherwise deep tendon reflexes in the upper and lower extremities are 1+ bilaterally (patient decline right leg due to pain) Toes:    The toes are upoing  bilaterally.   Clonus:    Clonus is absent.    Assessment/Plan:  Patient with progressive memory loss, word finding, short-term memory, executive function, declining MMSE since 2021 27 to 20/30 today. Amnestic MCI  Will perform workup for dementia, MRI of the brain for reversible causes of memory loss in open MRI and I will give you xanax Start Donepezil/Aricept 5mg  if doing well mychart or call and we can increase to 10mg  in one month Blood and urine work today MRI of the brain w/wo contrast - open MRI and Xanax   Orders Placed This Encounter  Procedures   Culture, Urine   MR BRAIN W WO CONTRAST   CBC with Differential/Platelets   Comprehensive metabolic panel   TSH Rfx on Abnormal to Free T4   Urinalysis, Routine w reflex microscopic   B12 and Folate Panel   Methylmalonic acid, serum   Vitamin B1   RPR   Vitamin D, 25-hydroxy   ATN PROFILE   APOE Alzheimer's Risk   Meds ordered this encounter  Medications                  donepezil (ARICEPT) 5 MG tablet    Sig: Take 1 tablet (5 mg total) by mouth at bedtime.    Dispense:  30 tablet    Refill:  0   ALPRAZolam (XANAX) 0.25 MG tablet    Sig: Take 1-2 tabs (0.25mg -0.50mg ) 30-60 minutes before procedure. May repeat if needed.Do not drive.    Dispense:  4 tablet    Refill:  0     Cc: Mila Palmer, MD,  Mila Palmer, MD  Naomie Dean, MD  [facility] [address] [place], [place] [address]  Phone [phone] Fax [phone]   I spent over 60 minutes of face-to-face and non-face-to-face time with patient on the  1. MCI (mild cognitive impairment)   2. Amnestic MCI (mild cognitive impairment with memory loss)   3. Short-term memory loss   4. Word finding difficulty    diagnosis.  This included previsit chart review, lab review, study review, order entry, electronic health record documentation, patient education on the different diagnostic and therapeutic options,  counseling and coordination of care, risks and benefits of management, compliance, or risk factor reduction

## 2022-10-23 ENCOUNTER — Telehealth: Payer: Self-pay | Admitting: Neurology

## 2022-10-23 NOTE — Telephone Encounter (Signed)
Contacted pharmacy, was informed they would recommenced Bactrim or microbid for this patient if she has a UTI suspectible to all antibiotics.

## 2022-10-23 NOTE — Telephone Encounter (Signed)
Patient has a UTI suspectible too all antibiotice will you call and see what they usually recommend: I ill forard to sharon wolters thanks   Comment: Escherichia coli, identified by an automated biochemical system. Cefazolin <=4 ug/mL Cefazolin with an MIC <=16 predicts susceptibility to the oral agents cefaclor, cefdinir, cefpodoxime, cefprozil, cefuroxime, cephalexin, and loracarbef when used for therapy of uncomplicated urinary tract infections due to E. coli, Klebsiella pneumoniae, and Proteus mirabilis. Greater than 100,000 colony forming units per mL  Antimicrobial Susceptibility Comment  Comment:       ** S = Susceptible; I = Intermediate; R = Resistant **                    P = Positive; N = Negative             MICS are expressed in micrograms per mL    Antibiotic                 RSLT#1    RSLT#2    RSLT#3    RSLT#4 Amoxicillin/Clavulanic Acid    S Ampicillin                     S Cefepime                       S Ceftriaxone                    S Cefuroxime                     S Ciprofloxacin                  S Ertapenem                      S Gentamicin                     S Imipenem                       S Levofloxacin                   S Meropenem                      S Nitrofurantoin                 S Piperacillin/Tazobactam        S Tetracycline                   S Tobramycin                     S Trimethoprim/Sulfa             S

## 2022-10-25 ENCOUNTER — Telehealth: Payer: Self-pay | Admitting: Neurology

## 2022-10-25 MED ORDER — AMOXICILLIN 250 MG PO CAPS: 250.0000 mg | ORAL_CAPSULE | Freq: Two times a day (BID) | ORAL | 0 refills | Status: AC

## 2022-10-25 NOTE — Telephone Encounter (Signed)
I had a 31 minute conversation with the patient and she took notes. We discussed that she has a UTI which she was not aware of.  She states she is not having any symptoms.  I advised Dr. Lucia Gaskins will be calling in a prescription for an antibiotic that she will need to take daily until complete.  Instructions will be on the bottle.  It will either be Macrobid or Bactrim.  We discussed her lab results which show biomarkers for Alzheimer's which increases the likelihood but does not mean she has not.  We are still waiting further lab results and MRI.  We discussed Xanax prescription for MRI and the instructions. She will need a driver.  We discussed that she has her MRI next Monday at [facility].  Patient was advised to be on the look out for a prescription for her antibiotic as well as picking up the Xanax but I advised the patient not to take Xanax until right before her MRI.  I offered to speak with her husband but she said he was hard of hearing.  Offered to call her son but she said that was not necessary.  Currently her caregiver Asher Muir is sick.  The patient's questions were answered and she verbalized appreciation for the call.

## 2022-10-25 NOTE — Telephone Encounter (Signed)
noted 

## 2022-10-25 NOTE — Telephone Encounter (Signed)
Call Dr. Paulino Rily office and spoke to the nurse.  They said they did not raise Brett Canales the results I forwarded again.  She is a 89 year old patient but her creatinine clearance is only 22 so I hesitate to use Macrobid or Bactrim but she is sensitive to all antibiotics so I need some guidance from Dr. Paulino Rily on what would be the best to treat her.  Nurse said she would discuss with Dr. Laurine Blazer and get back to me, her primary care.  Urine Culture, Routine Final report Abnormal   Organism ID, Bacteria Comment Abnormal   Comment: Escherichia coli, identified by an automated biochemical system. Cefazolin <=4 ug/mL Cefazolin with an MIC <=16 predicts susceptibility to the oral agents cefaclor, cefdinir, cefpodoxime, cefprozil, cefuroxime, cephalexin, and loracarbef when used for therapy of uncomplicated urinary tract infections due to E. coli, Klebsiella pneumoniae, and Proteus mirabilis. Greater than 100,000 colony forming units per mL  Antimicrobial Susceptibility Comment  Comment:       ** S = Susceptible; I = Intermediate; R = Resistant **                    P = Positive; N = Negative             MICS are expressed in micrograms per mL    Antibiotic                 RSLT#1    RSLT#2    RSLT#3    RSLT#4 Amoxicillin/Clavulanic Acid    S Ampicillin                     S Cefepime                       S Ceftriaxone                    S Cefuroxime                     S Ciprofloxacin                  S Ertapenem                      S Gentamicin                     S Imipenem                       S Levofloxacin                   S Meropenem                      S Nitrofurantoin                 S Piperacillin/Tazobactam        S Tetracycline                   S Tobramycin                     S Trimethoprim/Sulfa             S  Resulting Agency [id number]         Narrative Performed byVerdell Carmine Performed at:  [address] - [facility] [place] [address] [place], [place]  [id number] Lab Director:  Claudie Fisherman  Clovis Riley MD, Phone:  [phone]    Specimen Collected: 10/18/22 13:21 Last Resulted: 10/21/22 16:35

## 2022-10-25 NOTE — Telephone Encounter (Signed)
I called her pcp, call patient and see if she is being treated. If not we will send in prescription today. thanks

## 2022-10-25 NOTE — Addendum Note (Signed)
Addended by: Bertram Savin on: 10/25/2022 05:23 PM   Modules accepted: Orders

## 2022-10-25 NOTE — Telephone Encounter (Signed)
Per Dr Lucia Gaskins, order placed for Amoxicillin 250 mg BID x 5 days #10 with 0 refills.

## 2022-10-26 LAB — COMPREHENSIVE METABOLIC PANEL WITH GFR
ALT: 17 IU/L (ref 0–32)
AST: 30 IU/L (ref 0–40)
Albumin: 4.4 g/dL (ref 3.6–4.6)
Alkaline Phosphatase: 30 IU/L — ABNORMAL LOW (ref 44–121)
BUN/Creatinine Ratio: 25 (ref 12–28)
BUN: 33 mg/dL (ref 10–36)
Bilirubin Total: 0.4 mg/dL (ref 0.0–1.2)
CO2: 19 mmol/L — ABNORMAL LOW (ref 20–29)
Calcium: 10 mg/dL (ref 8.7–10.3)
Chloride: 98 mmol/L (ref 96–106)
Creatinine, Ser: 1.32 mg/dL — ABNORMAL HIGH (ref 0.57–1.00)
Globulin, Total: 2.6 g/dL (ref 1.5–4.5)
Glucose: 87 mg/dL (ref 70–99)
Potassium: 5.2 mmol/L (ref 3.5–5.2)
Sodium: 134 mmol/L (ref 134–144)
Total Protein: 7 g/dL (ref 6.0–8.5)
eGFR: 37 mL/min/1.73 — ABNORMAL LOW

## 2022-10-26 LAB — APOE ALZHEIMER'S DISEASE RISK

## 2022-10-26 LAB — ATN PROFILE
A -- Beta-amyloid 42/40 Ratio: 0.1 — ABNORMAL LOW
Beta-amyloid 40: 285.32 pg/mL
Beta-amyloid 42: 28.53 pg/mL
N -- NfL, Plasma: 10.7 pg/mL (ref 0.00–11.55)
T -- p-tau181: 3.91 pg/mL — ABNORMAL HIGH (ref 0.00–0.97)

## 2022-10-26 LAB — METHYLMALONIC ACID, SERUM: Methylmalonic Acid: 325 nmol/L (ref 0–378)

## 2022-10-26 LAB — SYPHILIS: RPR W/REFLEX TO RPR TITER AND TREPONEMAL ANTIBODIES, TRADITIONAL SCREENING AND DIAGNOSIS ALGORITHM: RPR Ser Ql: NONREACTIVE

## 2022-10-26 LAB — VITAMIN B1: Thiamine: 103.1 nmol/L (ref 66.5–200.0)

## 2022-10-30 DIAGNOSIS — G3184 Mild cognitive impairment, so stated: Secondary | ICD-10-CM | POA: Diagnosis not present

## 2022-11-02 ENCOUNTER — Telehealth: Payer: Self-pay | Admitting: *Deleted

## 2022-11-02 MED ORDER — DONEPEZIL HCL 10 MG PO TABS: 10.0000 mg | ORAL_TABLET | Freq: Every day | ORAL | 3 refills | Status: AC

## 2022-11-02 NOTE — Telephone Encounter (Signed)
Aricept 10mg  ordered thanks

## 2022-11-02 NOTE — Telephone Encounter (Signed)
-----   Message from Susan Marks sent at 10/26/2022 10:22 AM EDT ----- Markers in the blood are consistent with alzheimer's disease and she has one alzheimer's gene. Please cll and let them know because apparently they don;t read their mychart results because I resulted the UTI but they never read it thanks. At this poing nothing else to do except increase donepezil is she js doing well.

## 2022-11-02 NOTE — Telephone Encounter (Signed)
-----   Message from Anson Fret sent at 10/26/2022 10:22 AM EDT ----- Markers in the blood are consistent with alzheimer's disease and she has one alzheimer's gene. Please cll and let them know because apparently they don;t read their mychart results because I resulted the UTI but they never read it thanks. At this poing nothing else to do except increase donepezil is she js doing well.

## 2022-11-02 NOTE — Telephone Encounter (Signed)
Spoke to pt and husband  Gave lab results agreeable to increase Aricept  Husband expressed understanding .

## 2022-11-03 NOTE — Telephone Encounter (Signed)
Noted  

## 2022-11-08 ENCOUNTER — Telehealth: Payer: Self-pay | Admitting: *Deleted

## 2022-11-08 DIAGNOSIS — R4789 Other speech disturbances: Secondary | ICD-10-CM | POA: Diagnosis not present

## 2022-11-08 DIAGNOSIS — I10 Essential (primary) hypertension: Secondary | ICD-10-CM | POA: Diagnosis not present

## 2022-11-08 DIAGNOSIS — Z9989 Dependence on other enabling machines and devices: Secondary | ICD-10-CM | POA: Diagnosis not present

## 2022-11-08 DIAGNOSIS — R399 Unspecified symptoms and signs involving the genitourinary system: Secondary | ICD-10-CM | POA: Diagnosis not present

## 2022-11-08 DIAGNOSIS — G3184 Mild cognitive impairment, so stated: Secondary | ICD-10-CM | POA: Diagnosis not present

## 2022-11-08 NOTE — Telephone Encounter (Signed)
Received MRI brain results from [facility]. MRI date is 10/30/22. MRI placed on Dr Trevor Mace desk for review.  Impression:  No acute intracranial abnormality

## 2022-11-10 NOTE — Telephone Encounter (Signed)
Marijean Niemann [place] caregiver [phone] Will be coming in on Monday to fill out paperwork and a new DPR with her name on it so that she can be the primary person to be called. Marijean Niemann stated that she would like for RN not to call the pt with MRI results until new paperwork is filled out so that the pt is not  stressed.

## 2022-11-13 NOTE — Telephone Encounter (Signed)
Thank you [name] 

## 2022-11-13 NOTE — Telephone Encounter (Signed)
Ok great, thank you. 

## 2022-11-13 NOTE — Telephone Encounter (Signed)
DPR is in chart. I have called Matthias Hughs & LVM with office number asking for call back.

## 2022-11-14 NOTE — Telephone Encounter (Signed)
I spoke with Susan Marks, caregiver, and discussed the patient's MRI brain results which, per Dr. Lucia Gaskins, show age-related changes.  Impression of MRI states no acute intracranial abnormality.  She mentions that patient completed UTI treatment and then saw primary care last week for a repeat urine test.  The culture is pending currently.  They have not heard of the results yet.  [name]'s questions were answered during the call and she verbalized appreciation. She is the main contact now for the patient.

## 2022-11-14 NOTE — Telephone Encounter (Signed)
Susan Marks has returned the call to [place], [place] please call her back at [phone]

## 2022-11-21 DIAGNOSIS — L309 Dermatitis, unspecified: Secondary | ICD-10-CM | POA: Diagnosis not present

## 2022-11-21 DIAGNOSIS — M51369 Other intervertebral disc degeneration, lumbar region without mention of lumbar back pain or lower extremity pain: Secondary | ICD-10-CM | POA: Diagnosis not present

## 2022-11-21 DIAGNOSIS — M160 Bilateral primary osteoarthritis of hip: Secondary | ICD-10-CM | POA: Diagnosis not present

## 2022-11-21 DIAGNOSIS — I700 Atherosclerosis of aorta: Secondary | ICD-10-CM | POA: Diagnosis not present

## 2022-11-21 DIAGNOSIS — K5900 Constipation, unspecified: Secondary | ICD-10-CM | POA: Diagnosis not present

## 2022-11-21 DIAGNOSIS — K5790 Diverticulosis of intestine, part unspecified, without perforation or abscess without bleeding: Secondary | ICD-10-CM | POA: Diagnosis not present

## 2022-11-21 DIAGNOSIS — H8110 Benign paroxysmal vertigo, unspecified ear: Secondary | ICD-10-CM | POA: Diagnosis not present

## 2022-11-21 DIAGNOSIS — N390 Urinary tract infection, site not specified: Secondary | ICD-10-CM | POA: Diagnosis not present

## 2022-11-21 DIAGNOSIS — G4700 Insomnia, unspecified: Secondary | ICD-10-CM | POA: Diagnosis not present

## 2022-11-21 DIAGNOSIS — I129 Hypertensive chronic kidney disease with stage 1 through stage 4 chronic kidney disease, or unspecified chronic kidney disease: Secondary | ICD-10-CM | POA: Diagnosis not present

## 2022-11-21 DIAGNOSIS — M47816 Spondylosis without myelopathy or radiculopathy, lumbar region: Secondary | ICD-10-CM | POA: Diagnosis not present

## 2022-11-21 DIAGNOSIS — M1712 Unilateral primary osteoarthritis, left knee: Secondary | ICD-10-CM | POA: Diagnosis not present

## 2022-11-21 DIAGNOSIS — M810 Age-related osteoporosis without current pathological fracture: Secondary | ICD-10-CM | POA: Diagnosis not present

## 2022-11-21 DIAGNOSIS — D631 Anemia in chronic kidney disease: Secondary | ICD-10-CM | POA: Diagnosis not present

## 2022-11-21 DIAGNOSIS — G3184 Mild cognitive impairment, so stated: Secondary | ICD-10-CM | POA: Diagnosis not present

## 2022-11-21 DIAGNOSIS — N1831 Chronic kidney disease, stage 3a: Secondary | ICD-10-CM | POA: Diagnosis not present

## 2022-11-21 DIAGNOSIS — I447 Left bundle-branch block, unspecified: Secondary | ICD-10-CM | POA: Diagnosis not present

## 2022-11-21 DIAGNOSIS — E785 Hyperlipidemia, unspecified: Secondary | ICD-10-CM | POA: Diagnosis not present

## 2022-11-21 DIAGNOSIS — M5136 Other intervertebral disc degeneration, lumbar region: Secondary | ICD-10-CM | POA: Diagnosis not present

## 2022-11-21 DIAGNOSIS — E538 Deficiency of other specified B group vitamins: Secondary | ICD-10-CM | POA: Diagnosis not present

## 2022-11-21 DIAGNOSIS — R4789 Other speech disturbances: Secondary | ICD-10-CM | POA: Diagnosis not present

## 2022-11-21 DIAGNOSIS — F325 Major depressive disorder, single episode, in full remission: Secondary | ICD-10-CM | POA: Diagnosis not present

## 2022-11-21 DIAGNOSIS — Z9181 History of falling: Secondary | ICD-10-CM | POA: Diagnosis not present

## 2022-11-24 DIAGNOSIS — F0390 Unspecified dementia without behavioral disturbance: Secondary | ICD-10-CM | POA: Diagnosis not present

## 2022-11-24 DIAGNOSIS — M1990 Unspecified osteoarthritis, unspecified site: Secondary | ICD-10-CM | POA: Diagnosis not present

## 2022-11-24 DIAGNOSIS — Z23 Encounter for immunization: Secondary | ICD-10-CM | POA: Diagnosis not present

## 2022-11-24 DIAGNOSIS — Z9989 Dependence on other enabling machines and devices: Secondary | ICD-10-CM | POA: Diagnosis not present

## 2022-11-30 ENCOUNTER — Other Ambulatory Visit: Payer: Self-pay | Admitting: Neurology

## 2022-11-30 DIAGNOSIS — G3184 Mild cognitive impairment, so stated: Secondary | ICD-10-CM

## 2022-11-30 NOTE — Telephone Encounter (Signed)
Last seen on 7/31/4 Follow up scheduled on 04/17/23

## 2022-12-13 DIAGNOSIS — E441 Mild protein-calorie malnutrition: Secondary | ICD-10-CM | POA: Diagnosis not present

## 2022-12-13 DIAGNOSIS — L89152 Pressure ulcer of sacral region, stage 2: Secondary | ICD-10-CM | POA: Diagnosis not present

## 2022-12-18 ENCOUNTER — Encounter: Payer: Self-pay | Admitting: Neurology

## 2023-01-05 DIAGNOSIS — K5901 Slow transit constipation: Secondary | ICD-10-CM | POA: Diagnosis not present

## 2023-01-05 DIAGNOSIS — E871 Hypo-osmolality and hyponatremia: Secondary | ICD-10-CM | POA: Diagnosis not present

## 2023-03-12 DIAGNOSIS — Z7184 Encounter for health counseling related to travel: Secondary | ICD-10-CM | POA: Diagnosis not present

## 2023-03-12 DIAGNOSIS — F0390 Unspecified dementia without behavioral disturbance: Secondary | ICD-10-CM | POA: Diagnosis not present

## 2023-04-01 ENCOUNTER — Emergency Department (HOSPITAL_COMMUNITY): Payer: PPO

## 2023-04-01 ENCOUNTER — Other Ambulatory Visit: Payer: Self-pay

## 2023-04-01 ENCOUNTER — Inpatient Hospital Stay (HOSPITAL_COMMUNITY)
Admission: EM | Admit: 2023-04-01 | Discharge: 2023-04-05 | DRG: 184 | Disposition: A | Discharge status: Skilled Nursing Facility | Payer: PPO | Attending: Internal Medicine | Admitting: Internal Medicine

## 2023-04-01 ENCOUNTER — Encounter (HOSPITAL_COMMUNITY): Payer: Self-pay | Admitting: Internal Medicine

## 2023-04-01 DIAGNOSIS — D631 Anemia in chronic kidney disease: Secondary | ICD-10-CM | POA: Diagnosis present

## 2023-04-01 DIAGNOSIS — E871 Hypo-osmolality and hyponatremia: Secondary | ICD-10-CM | POA: Diagnosis present

## 2023-04-01 DIAGNOSIS — K5900 Constipation, unspecified: Secondary | ICD-10-CM | POA: Diagnosis not present

## 2023-04-01 DIAGNOSIS — E872 Acidosis, unspecified: Secondary | ICD-10-CM | POA: Diagnosis present

## 2023-04-01 DIAGNOSIS — K219 Gastro-esophageal reflux disease without esophagitis: Secondary | ICD-10-CM

## 2023-04-01 DIAGNOSIS — Z79899 Other long term (current) drug therapy: Secondary | ICD-10-CM

## 2023-04-01 DIAGNOSIS — Z886 Allergy status to analgesic agent status: Secondary | ICD-10-CM | POA: Diagnosis not present

## 2023-04-01 DIAGNOSIS — E782 Mixed hyperlipidemia: Secondary | ICD-10-CM

## 2023-04-01 DIAGNOSIS — N179 Acute kidney failure, unspecified: Secondary | ICD-10-CM | POA: Diagnosis present

## 2023-04-01 DIAGNOSIS — Z882 Allergy status to sulfonamides status: Secondary | ICD-10-CM | POA: Diagnosis not present

## 2023-04-01 DIAGNOSIS — W19XXXA Unspecified fall, initial encounter: Secondary | ICD-10-CM | POA: Diagnosis not present

## 2023-04-01 DIAGNOSIS — S225XXA Flail chest, initial encounter for closed fracture: Principal | ICD-10-CM

## 2023-04-01 DIAGNOSIS — Y92009 Unspecified place in unspecified non-institutional (private) residence as the place of occurrence of the external cause: Secondary | ICD-10-CM | POA: Diagnosis not present

## 2023-04-01 DIAGNOSIS — D638 Anemia in other chronic diseases classified elsewhere: Secondary | ICD-10-CM | POA: Diagnosis present

## 2023-04-01 DIAGNOSIS — N1832 Chronic kidney disease, stage 3b: Secondary | ICD-10-CM | POA: Diagnosis present

## 2023-04-01 DIAGNOSIS — W01198A Fall on same level from slipping, tripping and stumbling with subsequent striking against other object, initial encounter: Secondary | ICD-10-CM | POA: Diagnosis present

## 2023-04-01 DIAGNOSIS — M79606 Pain in leg, unspecified: Secondary | ICD-10-CM | POA: Diagnosis not present

## 2023-04-01 DIAGNOSIS — Y9301 Activity, walking, marching and hiking: Secondary | ICD-10-CM | POA: Diagnosis present

## 2023-04-01 DIAGNOSIS — Z83511 Family history of glaucoma: Secondary | ICD-10-CM

## 2023-04-01 DIAGNOSIS — Y92012 Bathroom of single-family (private) house as the place of occurrence of the external cause: Secondary | ICD-10-CM

## 2023-04-01 DIAGNOSIS — M79621 Pain in right upper arm: Secondary | ICD-10-CM | POA: Diagnosis present

## 2023-04-01 DIAGNOSIS — Z888 Allergy status to other drugs, medicaments and biological substances status: Secondary | ICD-10-CM

## 2023-04-01 DIAGNOSIS — E7800 Pure hypercholesterolemia, unspecified: Secondary | ICD-10-CM | POA: Diagnosis present

## 2023-04-01 DIAGNOSIS — E785 Hyperlipidemia, unspecified: Secondary | ICD-10-CM | POA: Diagnosis present

## 2023-04-01 DIAGNOSIS — Z8249 Family history of ischemic heart disease and other diseases of the circulatory system: Secondary | ICD-10-CM

## 2023-04-01 DIAGNOSIS — I129 Hypertensive chronic kidney disease with stage 1 through stage 4 chronic kidney disease, or unspecified chronic kidney disease: Secondary | ICD-10-CM | POA: Diagnosis present

## 2023-04-01 DIAGNOSIS — S2249XA Multiple fractures of ribs, unspecified side, initial encounter for closed fracture: Secondary | ICD-10-CM | POA: Diagnosis present

## 2023-04-01 DIAGNOSIS — I10 Essential (primary) hypertension: Secondary | ICD-10-CM

## 2023-04-01 DIAGNOSIS — S2241XA Multiple fractures of ribs, right side, initial encounter for closed fracture: Secondary | ICD-10-CM | POA: Diagnosis not present

## 2023-04-01 LAB — CBC WITH DIFFERENTIAL/PLATELET
Abs Immature Granulocytes: 0.04 10*3/uL (ref 0.00–0.07)
Basophils Absolute: 0 10*3/uL (ref 0.0–0.1)
Basophils Relative: 1 %
Eosinophils Absolute: 0.2 10*3/uL (ref 0.0–0.5)
Eosinophils Relative: 3 %
HCT: 36.8 % (ref 36.0–46.0)
Hemoglobin: 11.3 g/dL — ABNORMAL LOW (ref 12.0–15.0)
Immature Granulocytes: 1 %
Lymphocytes Relative: 17 %
Lymphs Abs: 1 10*3/uL (ref 0.7–4.0)
MCH: 32.8 pg (ref 26.0–34.0)
MCHC: 30.7 g/dL (ref 30.0–36.0)
MCV: 106.7 fL — ABNORMAL HIGH (ref 80.0–100.0)
Monocytes Absolute: 0.6 10*3/uL (ref 0.1–1.0)
Monocytes Relative: 10 %
Neutro Abs: 4.3 10*3/uL (ref 1.7–7.7)
Neutrophils Relative %: 68 %
Platelets: 228 10*3/uL (ref 150–400)
RBC: 3.45 MIL/uL — ABNORMAL LOW (ref 3.87–5.11)
RDW: 14.2 % (ref 11.5–15.5)
WBC: 6.2 10*3/uL (ref 4.0–10.5)
nRBC: 0 % (ref 0.0–0.2)

## 2023-04-01 LAB — BASIC METABOLIC PANEL
Anion gap: 10 (ref 5–15)
BUN: 36 mg/dL — ABNORMAL HIGH (ref 8–23)
CO2: 20 mmol/L — ABNORMAL LOW (ref 22–32)
Calcium: 9.5 mg/dL (ref 8.9–10.3)
Chloride: 102 mmol/L (ref 98–111)
Creatinine, Ser: 1.25 mg/dL — ABNORMAL HIGH (ref 0.44–1.00)
GFR, Estimated: 40 mL/min — ABNORMAL LOW (ref >60)
Glucose, Bld: 92 mg/dL (ref 70–99)
Potassium: 4.5 mmol/L (ref 3.5–5.1)
Sodium: 132 mmol/L — ABNORMAL LOW (ref 135–145)

## 2023-04-01 MED ORDER — FAMOTIDINE 20 MG PO TABS
40.0000 mg | ORAL_TABLET | Freq: Every day | ORAL | Status: DC
  Administered 2023-04-02: 40 mg via ORAL
  Filled 2023-04-01: qty 2

## 2023-04-01 MED ORDER — FENTANYL CITRATE PF 50 MCG/ML IJ SOSY
25.0000 ug | PREFILLED_SYRINGE | INTRAMUSCULAR | Status: DC | PRN
  Administered 2023-04-02 (×2): 25 ug via INTRAVENOUS
  Filled 2023-04-01 (×2): qty 1

## 2023-04-01 MED ORDER — ACETAMINOPHEN 650 MG RE SUPP
650.0000 mg | Freq: Four times a day (QID) | RECTAL | Status: DC | PRN
Start: 2023-04-01 — End: 2023-04-02

## 2023-04-01 MED ORDER — NALOXONE HCL 0.4 MG/ML IJ SOLN: 0.4000 mg | INTRAMUSCULAR | Status: DC | PRN

## 2023-04-01 MED ORDER — FENOFIBRATE 160 MG PO TABS
160.0000 mg | ORAL_TABLET | Freq: Every day | ORAL | Status: DC
  Administered 2023-04-02 – 2023-04-05 (×4): 160 mg via ORAL
  Filled 2023-04-01 (×4): qty 1

## 2023-04-01 MED ORDER — DONEPEZIL HCL 10 MG PO TABS
10.0000 mg | ORAL_TABLET | Freq: Every day | ORAL | Status: DC
  Administered 2023-04-02 – 2023-04-04 (×3): 10 mg via ORAL
  Filled 2023-04-01 (×3): qty 1

## 2023-04-01 MED ORDER — MELATONIN 3 MG PO TABS: 3.0000 mg | ORAL_TABLET | Freq: Every evening | ORAL | Status: DC | PRN

## 2023-04-01 MED ORDER — DICLOFENAC EPOLAMINE 1.3 % EX PTCH
1.0000 | MEDICATED_PATCH | Freq: Two times a day (BID) | CUTANEOUS | Status: DC
  Administered 2023-04-01 – 2023-04-05 (×8): 1 via TRANSDERMAL
  Filled 2023-04-01 (×9): qty 1

## 2023-04-01 MED ORDER — ONDANSETRON HCL 4 MG/2ML IJ SOLN: 4.0000 mg | Freq: Four times a day (QID) | INTRAMUSCULAR | Status: DC | PRN

## 2023-04-01 MED ORDER — OXYCODONE-ACETAMINOPHEN 5-325 MG PO TABS
1.0000 | ORAL_TABLET | Freq: Four times a day (QID) | ORAL | Status: DC | PRN
  Administered 2023-04-02: 1 via ORAL
  Filled 2023-04-01: qty 1

## 2023-04-01 MED ORDER — FENTANYL CITRATE PF 50 MCG/ML IJ SOSY
50.0000 ug | PREFILLED_SYRINGE | Freq: Once | INTRAMUSCULAR | Status: AC
  Administered 2023-04-01: 50 ug via INTRAVENOUS
  Filled 2023-04-01: qty 1

## 2023-04-01 MED ORDER — LOSARTAN POTASSIUM 50 MG PO TABS
100.0000 mg | ORAL_TABLET | Freq: Every day | ORAL | Status: DC
  Administered 2023-04-02: 100 mg via ORAL
  Filled 2023-04-01: qty 2

## 2023-04-01 MED ORDER — ACETAMINOPHEN 325 MG PO TABS
650.0000 mg | ORAL_TABLET | Freq: Four times a day (QID) | ORAL | Status: DC | PRN
Start: 2023-04-01 — End: 2023-04-02

## 2023-04-01 NOTE — H&P (Addendum)
 History and Physical      Susan Marks FMW:[id number] DOB: [birth year] DOA: 04/01/2023; DOS: 04/01/2023  PCP: Verena Mems, MD  Patient coming from: home   I have personally briefly reviewed patient's old medical records in [facility]  Chief Complaint: fall  HPI: Susan Marks is a 89 y.o. female with medical history significant for essential hypertension, hyperlipidemia, GERD, who is admitted to [facility] on 04/01/2023 with multiple consecutive right-sided rib fractures after ground-level mechanical fall at home, after presenting from home to [facility] ED complaining of fall.   The patient, who lives at home, reports that she slipped in the bathroom earlier today resulting in a ground-level fall in which the right portion of her chest was the visible point of contact and striking the upper edge of the bathtub.  She notes immediate development of sharp, nonradiating right lateral chest wall discomfort.  She conveys that she did not lose consciousness nor did she hit her head as a component of this fall.  Not on any blood thinners as an outpatient, including no aspirin.  Denies any resultant headache or new neck pain.  Aside from the right lateral chest wall discomfort, denies any acute arthralgias or myalgias as a result of the above fall.  Denies any associated shortness of breath, palpitations, nausea, vomiting, diaphoresis, dizziness.  Denies any chronic underlying pulmonary pathology.  No recent subjective fever, chills or rigors, generalized myalgias.  Denies any recent dysuria or gross hematuria.    ED Course:  Vital signs in the ED were notable for the following: Afebrile; heart rates in the 60s to 70s; systolic blood pressures initially in the 190s, which subsequently decreased in the range of 150s to 160s following improvement in pain control; respiratory rate 16-21, oxygen saturation 96 to 98% on room air.  Labs were notable for the following: BMP notable for the  following: Sodium 132 compared to most recent prior value of 134 in July 2024, bicarbonate 20, anion gap 10, BUN 36 compared to 33 in July 2024, creatinine 1.25 compared to 1.32 in July 2024, glucose 92.  CBC notable for Locasol count 6200, hemoglobin 11.3 associated with mildly elevated MCV of 106, and associated with normochromic properties as well as nonelevated RDW, which is relative to most recent prior hemoglobin value of 11.0 in July 2024, platelet count 228.  Per my interpretation, EKG in ED demonstrated the following: No EKG performed in the ED today.  Imaging in the ED, per corresponding formal radiology read, was notable for the following: Noncontrast CT head showed no evidence of acute intracranial process, including no evidence of intracranial strain evidence of acute infarct.  CT cervical spine showed no evidence of acute cervical spine fracture or subluxation injury.  CT chest without contrast showed right-sided rib fractures of ribs 9 through 12, with the 9th through 11th right-sided rib fractures noted to be badly displaced and segmental, suggestive of a flail chest segment, in the absence of any evidence of pneumothorax.  CT chest also showed patchy areas of groundglass opacities in the dependent bilateral lower lobes, which may represent hypoventilatory changes or contusion.  CT chest showed no evidence of infiltrate, edema, or effusion.  EDP discussed patient's case with on-call trauma surgeon, Dr. Sebastian, who recommended TRH admission at [facility] for pain control, and conveyed that trauma surgery will formally consult.   While in the ED, the following were administered: Diclofenac  transdermal patch, fentanyl  50 mcg IV x 1 dose.  Subsequently, the patient  was admitted for further evaluation management had multiple consecutive right-sided rib fractures, after mechanical ground-level fall at home, with presenting labs also notable for mild acute hyponatremia.    Review of Systems: As per  HPI otherwise 10 point review of systems negative.   Past Medical History:  Diagnosis Date   Chronic headaches    migraines   Chronic kidney disease    High blood pressure    High cholesterol     Past Surgical History:  Procedure Laterality Date   VESICOVAGINAL FISTULA CLOSURE W/ TAH      Social History:  reports that she has never smoked. She has never used smokeless tobacco. She reports current alcohol use. She reports that she does not use drugs.   Allergies  Allergen Reactions   Sulfa Antibiotics Other (See Comments)    REACTION: swollen under eyes and cheek   Alendronate     Other Reaction(s): stomach upset   Asa [Aspirin] Other (See Comments)    Kidney Problems   Lisinopril     cough   Other Swelling    Preservatives in eye drops   Statins     Leg cramps    Family History  Problem Relation Age of Onset   Heart disease Father    Heart murmur Sister    Glaucoma Sister    Allergies Sister     Family history reviewed and not pertinent    Prior to Admission medications   Medication Sig Start Date End Date Taking? Authorizing Provider  acetaminophen  (TYLENOL ) 650 MG CR tablet 2 tablets daily with breakfast Orally May take 2 more tablets as needed by the end of the day    [provider]  ALPRAZolam  (XANAX ) 0.25 MG tablet Take 1-2 tabs (0.25mg -0.50mg ) 30-60 minutes before procedure. May repeat if needed.Do not drive. 10/18/22   Ines Onetha NOVAK, MD  Cholecalciferol 50 MCG (2000 UT) TABS Take 2,000 Units by mouth.    [provider]  cyanocobalamin  1000 MCG tablet Take 1,000 mcg by mouth daily.    [provider]  denosumab  (PROLIA ) 60 MG/ML SOLN injection Inject 60 mg into the skin every 6 (six) months. Administer in upper arm, thigh, or abdomen    [provider]  donepezil  (ARICEPT ) 10 MG tablet Take 1 tablet (10 mg total) by mouth at bedtime. 11/02/22   Ines Onetha NOVAK, MD  famotidine  (PEPCID ) 40 MG tablet Take 40 mg by  mouth daily.    [provider]  fenofibrate  micronized (LOFIBRA) 200 MG capsule Take 200 mg by mouth at bedtime.    [provider]  lidocaine  4 % as directed Externally    [provider]  losartan  (COZAAR ) 100 MG tablet TAKE 1 TABLET BY MOUTH EVERY DAY 09/16/14   Shlomo Wilbert SAUNDERS, MD     Objective    Physical Exam: Vitals:   04/01/23 1852 04/01/23 1930 04/01/23 2045 04/01/23 2147  BP: (!) 192/100 (!) 192/85  (!) 161/90  Pulse: 61 63 65 61  Resp: 13 19 16 13   Temp: 97.8 F (36.6 C)   97.8 F (36.6 C)  TempSrc:    Oral  SpO2: 98% 97% 95% 96%    General: appears to be stated age; alert, oriented Skin: warm, dry, no rash Head:  AT/[place] Mouth:  Oral mucosa membranes appear dry, normal dentition Neck: supple; trachea midline Heart:  RRR; did not appreciate any M/R/G Lungs: CTAB, did not appreciate any wheezes, rales, or rhonchi Abdomen: + BS; soft, ND,  NT Vascular: 2+ pedal pulses b/l; 2+ radial pulses b/l Extremities: no peripheral edema, no muscle wasting Neuro: strength and sensation intact in upper and lower extremities b/l    Labs on Admission: I have personally reviewed following labs and imaging studies  CBC: Recent Labs  Lab 04/01/23 1951  WBC 6.2  NEUTROABS 4.3  HGB 11.3*  HCT 36.8  MCV 106.7*  PLT 228   Basic Metabolic Panel: Recent Labs  Lab 04/01/23 2025  NA 132*  K 4.5  CL 102  CO2 20*  GLUCOSE 92  BUN 36*  CREATININE 1.25*  CALCIUM 9.5   GFR: CrCl cannot be calculated (Unknown ideal weight.). Liver Function Tests: No results for input(s): AST, ALT, ALKPHOS, BILITOT, PROT, ALBUMIN in the last 168 hours. No results for input(s): LIPASE, AMYLASE in the last 168 hours. No results for input(s): AMMONIA in the last 168 hours. Coagulation Profile: No results for input(s): INR, PROTIME in the last 168 hours. Cardiac Enzymes: No results for input(s): CKTOTAL, CKMB, CKMBINDEX, TROPONINI in  the last 168 hours. BNP (last 3 results) No results for input(s): PROBNP in the last 8760 hours. HbA1C: No results for input(s): HGBA1C in the last 72 hours. CBG: No results for input(s): GLUCAP in the last 168 hours. Lipid Profile: No results for input(s): CHOL, HDL, LDLCALC, TRIG, CHOLHDL, LDLDIRECT in the last 72 hours. Thyroid  Function Tests: No results for input(s): TSH, T4TOTAL, FREET4, T3FREE, THYROIDAB in the last 72 hours. Anemia Panel: No results for input(s): VITAMINB12, FOLATE, FERRITIN, TIBC, IRON, RETICCTPCT in the last 72 hours. Urine analysis:    Component Value Date/Time   APPEARANCEUR Clear 10/18/2022 1320   GLUCOSEU Negative 10/18/2022 1320   BILIRUBINUR Negative 10/18/2022 1320   PROTEINUR Negative 10/18/2022 1320   NITRITE Negative 10/18/2022 1320   LEUKOCYTESUR 1+ (A) 10/18/2022 1320    Radiological Exams on Admission: CT Chest Wo Contrast Result Date: 04/01/2023 CLINICAL DATA:  Slip and fall.  Right rib fracture. EXAM: CT CHEST WITHOUT CONTRAST TECHNIQUE: Multidetector CT imaging of the chest was performed following the standard protocol without IV contrast. RADIATION DOSE REDUCTION: This exam was performed according to the departmental dose-optimization program which includes automated exposure control, adjustment of the mA and/or kV according to patient size and/or use of iterative reconstruction technique. COMPARISON:  Rib radiographs earlier today FINDINGS: Cardiovascular: The heart is upper normal in size. There are coronary artery calcifications. No pericardial effusion. Moderate atherosclerosis of the thoracic aorta. No periaortic stranding. Aortic tortuosity but no aneurysm. Mediastinum/Nodes: No mediastinal hematoma or hemorrhage. Coarsely calcified mediastinal and right hilar lymph nodes. No noncalcified adenopathy. Small hiatal hernia. Lungs/Pleura: No pneumothorax. Right pleural thickening in the dependent lower lobe  adjacent to rib fractures. There is also minimal pleural thickening in the posterior left lower lobe. Patchy areas of ground-glass in the dependent lower lobes may represent hypoventilatory change or contusion. There are multiple benign calcified granuloma, needing no further imaging follow-up. Upper Abdomen: Calcified splenic granuloma. No free fluid or evidence of traumatic injury. Musculoskeletal: Right rib fractures 9 through 12. The ninth through eleventh rib fractures are displaced and segmental. There is adjacent subcutaneous emphysema in the right lateral chest wall. Scoliotic curvature of the thoracic spine without acute thoracic spine fracture. The sternum, included clavicles and shoulder girdles are intact. IMPRESSION: 1. Right rib fractures 9 through 12. The ninth through eleventh rib fractures are displaced and segmental. This is consistent with a flail chest segment. 2. Right pleural thickening in the dependent lower lobe  adjacent to rib fractures. There is also minimal pleural thickening in the posterior left lower lobe. No pneumothorax. 3. Patchy areas of ground-glass in the dependent lower lobes may represent hypoventilatory change or contusion. 4. Coronary artery calcifications. Aortic Atherosclerosis (ICD10-I70.0). 5. Sequela of prior granulomatous disease. Electronically Signed   By: Andrea Gasman M.D.   On: 04/01/2023 21:23   CT Head Wo Contrast Result Date: 04/01/2023 CLINICAL DATA:  Trauma EXAM: CT HEAD WITHOUT CONTRAST CT CERVICAL SPINE WITHOUT CONTRAST TECHNIQUE: Multidetector CT imaging of the head and cervical spine was performed following the standard protocol without intravenous contrast. Multiplanar CT image reconstructions of the cervical spine were also generated. RADIATION DOSE REDUCTION: This exam was performed according to the departmental dose-optimization program which includes automated exposure control, adjustment of the mA and/or kV according to patient size and/or use  of iterative reconstruction technique. COMPARISON:  None Available. FINDINGS: CT HEAD FINDINGS Brain: No evidence of acute infarction, hemorrhage, hydrocephalus, extra-axial collection or mass lesion/mass effect. Subcortical white matter and periventricular small vessel ischemic changes. Cortical volume is age-appropriate. Vascular: No hyperdense vessel or unexpected calcification. Skull: Normal. Negative for fracture or focal lesion. Sinuses/Orbits: The visualized paranasal sinuses are essentially clear. The mastoid air cells are unopacified. Other: None. CT CERVICAL SPINE FINDINGS Alignment: Normal cervical lordosis. Skull base and vertebrae: No acute fracture. No primary bone lesion or focal pathologic process. Soft tissues and spinal canal: No prevertebral fluid or swelling. No visible canal hematoma. Disc levels: Mild degenerative changes of the mid cervical spine. Spinal canal is patent. Upper chest: Evaluated on dedicated CT chest. Other: None. IMPRESSION: No acute intracranial abnormality. Small vessel ischemic changes. No traumatic injury to the cervical spine. Mild degenerative changes. Electronically Signed   By: Pinkie Pebbles M.D.   On: 04/01/2023 21:17   CT Cervical Spine Wo Contrast Result Date: 04/01/2023 CLINICAL DATA:  Trauma EXAM: CT HEAD WITHOUT CONTRAST CT CERVICAL SPINE WITHOUT CONTRAST TECHNIQUE: Multidetector CT imaging of the head and cervical spine was performed following the standard protocol without intravenous contrast. Multiplanar CT image reconstructions of the cervical spine were also generated. RADIATION DOSE REDUCTION: This exam was performed according to the departmental dose-optimization program which includes automated exposure control, adjustment of the mA and/or kV according to patient size and/or use of iterative reconstruction technique. COMPARISON:  None Available. FINDINGS: CT HEAD FINDINGS Brain: No evidence of acute infarction, hemorrhage, hydrocephalus, extra-axial  collection or mass lesion/mass effect. Subcortical white matter and periventricular small vessel ischemic changes. Cortical volume is age-appropriate. Vascular: No hyperdense vessel or unexpected calcification. Skull: Normal. Negative for fracture or focal lesion. Sinuses/Orbits: The visualized paranasal sinuses are essentially clear. The mastoid air cells are unopacified. Other: None. CT CERVICAL SPINE FINDINGS Alignment: Normal cervical lordosis. Skull base and vertebrae: No acute fracture. No primary bone lesion or focal pathologic process. Soft tissues and spinal canal: No prevertebral fluid or swelling. No visible canal hematoma. Disc levels: Mild degenerative changes of the mid cervical spine. Spinal canal is patent. Upper chest: Evaluated on dedicated CT chest. Other: None. IMPRESSION: No acute intracranial abnormality. Small vessel ischemic changes. No traumatic injury to the cervical spine. Mild degenerative changes. Electronically Signed   By: Pinkie Pebbles M.D.   On: 04/01/2023 21:17   DG Ribs Unilateral W/Chest Right Result Date: 04/01/2023 CLINICAL DATA:  Fall, right axillary pain EXAM: RIGHT RIBS AND CHEST - 3+ VIEW COMPARISON:  Chest radiographs dated 05/30/2022 FINDINGS: Lungs are essentially clear.  No pleural effusion or pneumothorax. The heart  is normal in size.  Thoracic aortic atherosclerosis. Mildly displaced right lateral 9th rib fracture. IMPRESSION: Mildly displaced right lateral 9th rib fracture. No pneumothorax. Electronically Signed   By: Pinkie Pebbles M.D.   On: 04/01/2023 20:26      Assessment/Plan    Principal Problem:   Multiple rib fractures Active Problems:   GERD (gastroesophageal reflux disease)   Essential hypertension   Fall at home, initial encounter   Acute hyponatremia   HLD (hyperlipidemia)   Anemia of chronic disease    #) Multiple consecutive right-sided rib fractures: Following ground-level mechanical fall experienced earlier today without  loss of consciousness, presenting imaging shows evidence of multiple consecutive right-sided rib fractures involving ribs 9 through 12, with the 9th through 11th right-sided rib fractures noted to be mildly displaced and segmental, suggestive of an element of flail chest segment.  Not associate with any evidence of pneumothorax.  CT chest also showed potential hypoventilatory changes versus pulmonary contusion, as further detailed above, but no evidence of pulmonary hemorrhage.  Given the involvement of multiple consecutive ribs, the patient is being admitted for overnight observation to ensure establishment of adequate pain control so as to promote sufficient inspiratory effort in order to decrease subsequent risk for development of pneumonia should insufficient pain control not be achieved.  At this time, the patient does not appear to be in any respiratory distress, and is maintaining oxygen saturations in the high 90s on room air.  She was started on a diclofenac  transdermal patch in the ED . will order prn Percocet for the anti-inflammatory benefits a/w inclusion of acetaminophen .   EDP discussed patient's case with on-call trauma surgeon, Dr. Sebastian, who recommended TRH admission at [facility] [place] for pain control, and conveyed that trauma surgery will formally consult.     Plan: Continue diclofenac  transdermal patch, as above.  Prn Percocet, as further described above.  Prn IV fentanyl  for breakthrough severe pain.  As needed Narcan .  Incentive spirometry to evaluate for and promote adequate inspiratory effort in the setting of presenting multiple consecutive rib fractures, in part as a means of assessing adequacy of current level of pain control.  Trauma surgery to formally consult, as above.  Check serum phosphorus level.  Repeat CBC in the morning.                    #) Ground-level mechanical fall: Ground-level mechanical fall while ambulating in her bathroom at home earlier this evening in  the absence of any associated loss of consciousness or apparent head trauma.  Not on any blood thinners as an outpatient.  Appears to have resulted in multiple consecutive right-sided-sided rib fractures and potential pulmonary contusion, as further described above, in the absence of any additional overt trauma including CT head which showed no evidence of acute intracranial process will CT cervical spine showed no evidence of acute cervical spine fracture or subluxation injury..    Plan: Work-up and management of presenting multiple consecutive rib fractures, as further described above. Check UA.  Fall precautions.  PT/OT consults ordered.                     #) Acute hypo-osmolar hyponatremia: Presenting serum sodium level 132 compared to most recent prior value of 134 in July 2024.  She appears relatively euvolemic, although there may be a slight element of hypovolemia in the setting of finding of prerenal azotemia, as quantified above. Differential also includes the possibility of a contribution from SIADH,  including from her presenting a fall resulting in potential pulmonary contusion on CT scan as outlined above.  She does not believe that she hit her head as a component of this fall, reducing likelihood of cerebral salt wasting syndrome.  No overt outpatient pharmacologic contributions.  Of note, no evidence of associated acute focal neurologic deficits.   will refrain from provision of additional IVF's pending the result of random urine sodium and urine osmolality studies to provide additional insight into potential contributions from dehydration vs SIADH, as subsequent management would differ depending upon these results.   Plan: monitor strict I's and O's and daily weights.  check UA, random urine sodium, urine osmolality.  Check serum osmolality to confirm suspected hypoosmolar etiology.  Repeat CMP in the morning. Check TSH. Check serum uric acid level, as SIADH can be  associated with hypouricemia due to hyperuricuria.                     #) Essential Hypertension: documented h/o such, with outpatient antihypertensive regimen including losartan .  SBP's in the ED today: Were initially elevated up to 190, before [place] improving into the 150s to 160s mmHg following improvement in pain control, as outlined above.  Renal function appears to be at baseline.  Plan: Close monitoring of subsequent BP via routine VS. resume home losartan .                   #) Hyperlipidemia: documented h/o such. On fenofibrate  as outpatient.   Plan: continue home statin.                   #) GERD: documented h/o such; on Pepcid  as outpatient.   Plan: continue home Pepcid .                   #) Anemia of chronic disease: Documented history of such, a/w with baseline hgb range 10-11, with presenting hgb consistent with this range, in the absence of any overt evidence of active bleed.  However, her presenting CBC reflects mild macrocytosis.  Will pursue additional laboratory evaluation, as outlined below.   Plan: Repeat CBC in the morning.  Check folic acid  level, B12 level, as well as PTT/INR.    DVT prophylaxis: SCD's   Code Status: Full code Family Communication: I discussed the patient's case with her son, who is present at bedside. Disposition Plan: Per Rounding Team Consults called: EDP discussed patient's case/imaging with on-call trauma surgery, Dr. Sebastian, who conveyed that trauma surgery will formally consult at [facility], as further detailed above;  Admission status: Inpatient    I SPENT GREATER THAN 75  MINUTES IN CLINICAL CARE TIME/MEDICAL DECISION-MAKING IN COMPLETING THIS ADMISSION.     Amro Winebarger B Myishia Kasik DO [facility] From 7PM - 7AM   04/01/2023, 10:45 PM

## 2023-04-01 NOTE — ED Provider Notes (Signed)
 [place] [facility] AT [facility] Provider Note   CSN: [id number] Arrival date & time: 04/01/23  1839     History  Chief Complaint  Patient presents with   Rib Injury   Fall    Susan Marks is a 89 y.o. female.  HPI   Presents via EMS accompanied by her son who assists with the history.  She presents after a fall.  Mostly clear on the event, but there are some consideration that she may have hit her head in addition to her right side.  In essence, the patient was in the bathroom, fell, striking her right side against a hard object and since that time has had severe pain in the right axilla.  Pain is worse with breathing, unclear, but unlikely loss of consciousness, no focal weakness in any extremity.  Patient was in usual state of health prior to the event.  Home Medications Prior to Admission medications   Medication Sig Start Date End Date Taking? Authorizing Provider  acetaminophen  (TYLENOL ) 650 MG CR tablet 2 tablets daily with breakfast Orally May take 2 more tablets as needed by the end of the day    [provider]  ALPRAZolam  (XANAX ) 0.25 MG tablet Take 1-2 tabs (0.25mg -0.50mg ) 30-60 minutes before procedure. May repeat if needed.Do not drive. 10/18/22   Ines Onetha NOVAK, MD  Cholecalciferol 50 MCG (2000 UT) TABS Take 2,000 Units by mouth.    [provider]  cyanocobalamin  1000 MCG tablet Take 1,000 mcg by mouth daily.    [provider]  denosumab  (PROLIA ) 60 MG/ML SOLN injection Inject 60 mg into the skin every 6 (six) months. Administer in upper arm, thigh, or abdomen    [provider]  donepezil  (ARICEPT ) 10 MG tablet Take 1 tablet (10 mg total) by mouth at bedtime. 11/02/22   Ines Onetha NOVAK, MD  famotidine  (PEPCID ) 40 MG tablet Take 40 mg by mouth daily.    [provider]  fenofibrate  micronized (LOFIBRA) 200 MG capsule Take 200 mg by mouth at bedtime.    [provider]  lidocaine  4 %  as directed Externally    [provider]  losartan  (COZAAR ) 100 MG tablet TAKE 1 TABLET BY MOUTH EVERY DAY 09/16/14   Shlomo Wilbert SAUNDERS, MD      Allergies    Sulfa antibiotics, Alendronate, Asa [aspirin], Lisinopril, Other, and Statins    Review of Systems   Review of Systems  Physical Exam Updated Vital Signs BP (!) 192/85   Pulse 65   Temp 97.8 F (36.6 C)   Resp 16   SpO2 95%  Physical Exam Vitals and nursing note reviewed.  Constitutional:      General: She is not in acute distress.    Appearance: She is well-developed.  HENT:     Head: Normocephalic and atraumatic.  Eyes:     Conjunctiva/sclera: Conjunctivae normal.  Cardiovascular:     Rate and Rhythm: Normal rate and regular rhythm.  Pulmonary:     Effort: Pulmonary effort is normal. No respiratory distress.     Breath sounds: Normal breath sounds. No stridor.  Abdominal:     General: There is no distension.  Musculoskeletal:     Comments: Patient indicates pain in the lower mid axilla.  Pelvis stable, she flexes each hip independently, denies any lower extremity pain.  Shoulders grossly unremarkable aside from hesitancy to move the right arm secondary to pain in the axilla.  Neck unremarkable grossly, head  atraumatic.  Skin:    General: Skin is warm and dry.  Neurological:     General: No focal deficit present.     Mental Status: She is alert and oriented to person, place, and time.     Cranial Nerves: No cranial nerve deficit.  Psychiatric:        Mood and Affect: Mood normal.     ED Results / Procedures / Treatments   Labs (all labs ordered are listed, but only abnormal results are displayed) Labs Reviewed  CBC WITH DIFFERENTIAL/PLATELET - Abnormal; Notable for the following components:      Result Value   RBC 3.45 (*)    Hemoglobin 11.3 (*)    MCV 106.7 (*)    All other components within normal limits  BASIC METABOLIC PANEL    EKG None  Radiology CT Chest Wo Contrast Result Date:  04/01/2023 CLINICAL DATA:  Slip and fall.  Right rib fracture. EXAM: CT CHEST WITHOUT CONTRAST TECHNIQUE: Multidetector CT imaging of the chest was performed following the standard protocol without IV contrast. RADIATION DOSE REDUCTION: This exam was performed according to the departmental dose-optimization program which includes automated exposure control, adjustment of the mA and/or kV according to patient size and/or use of iterative reconstruction technique. COMPARISON:  Rib radiographs earlier today FINDINGS: Cardiovascular: The heart is upper normal in size. There are coronary artery calcifications. No pericardial effusion. Moderate atherosclerosis of the thoracic aorta. No periaortic stranding. Aortic tortuosity but no aneurysm. Mediastinum/Nodes: No mediastinal hematoma or hemorrhage. Coarsely calcified mediastinal and right hilar lymph nodes. No noncalcified adenopathy. Small hiatal hernia. Lungs/Pleura: No pneumothorax. Right pleural thickening in the dependent lower lobe adjacent to rib fractures. There is also minimal pleural thickening in the posterior left lower lobe. Patchy areas of ground-glass in the dependent lower lobes may represent hypoventilatory change or contusion. There are multiple benign calcified granuloma, needing no further imaging follow-up. Upper Abdomen: Calcified splenic granuloma. No free fluid or evidence of traumatic injury. Musculoskeletal: Right rib fractures 9 through 12. The ninth through eleventh rib fractures are displaced and segmental. There is adjacent subcutaneous emphysema in the right lateral chest wall. Scoliotic curvature of the thoracic spine without acute thoracic spine fracture. The sternum, included clavicles and shoulder girdles are intact. IMPRESSION: 1. Right rib fractures 9 through 12. The ninth through eleventh rib fractures are displaced and segmental. This is consistent with a flail chest segment. 2. Right pleural thickening in the dependent lower lobe  adjacent to rib fractures. There is also minimal pleural thickening in the posterior left lower lobe. No pneumothorax. 3. Patchy areas of ground-glass in the dependent lower lobes may represent hypoventilatory change or contusion. 4. Coronary artery calcifications. Aortic Atherosclerosis (ICD10-I70.0). 5. Sequela of prior granulomatous disease. Electronically Signed   By: Andrea Gasman M.D.   On: 04/01/2023 21:23   CT Head Wo Contrast Result Date: 04/01/2023 CLINICAL DATA:  Trauma EXAM: CT HEAD WITHOUT CONTRAST CT CERVICAL SPINE WITHOUT CONTRAST TECHNIQUE: Multidetector CT imaging of the head and cervical spine was performed following the standard protocol without intravenous contrast. Multiplanar CT image reconstructions of the cervical spine were also generated. RADIATION DOSE REDUCTION: This exam was performed according to the departmental dose-optimization program which includes automated exposure control, adjustment of the mA and/or kV according to patient size and/or use of iterative reconstruction technique. COMPARISON:  None Available. FINDINGS: CT HEAD FINDINGS Brain: No evidence of acute infarction, hemorrhage, hydrocephalus, extra-axial collection or mass lesion/mass effect. Subcortical white matter and periventricular small  vessel ischemic changes. Cortical volume is age-appropriate. Vascular: No hyperdense vessel or unexpected calcification. Skull: Normal. Negative for fracture or focal lesion. Sinuses/Orbits: The visualized paranasal sinuses are essentially clear. The mastoid air cells are unopacified. Other: None. CT CERVICAL SPINE FINDINGS Alignment: Normal cervical lordosis. Skull base and vertebrae: No acute fracture. No primary bone lesion or focal pathologic process. Soft tissues and spinal canal: No prevertebral fluid or swelling. No visible canal hematoma. Disc levels: Mild degenerative changes of the mid cervical spine. Spinal canal is patent. Upper chest: Evaluated on dedicated CT  chest. Other: None. IMPRESSION: No acute intracranial abnormality. Small vessel ischemic changes. No traumatic injury to the cervical spine. Mild degenerative changes. Electronically Signed   By: Pinkie Pebbles M.D.   On: 04/01/2023 21:17   CT Cervical Spine Wo Contrast Result Date: 04/01/2023 CLINICAL DATA:  Trauma EXAM: CT HEAD WITHOUT CONTRAST CT CERVICAL SPINE WITHOUT CONTRAST TECHNIQUE: Multidetector CT imaging of the head and cervical spine was performed following the standard protocol without intravenous contrast. Multiplanar CT image reconstructions of the cervical spine were also generated. RADIATION DOSE REDUCTION: This exam was performed according to the departmental dose-optimization program which includes automated exposure control, adjustment of the mA and/or kV according to patient size and/or use of iterative reconstruction technique. COMPARISON:  None Available. FINDINGS: CT HEAD FINDINGS Brain: No evidence of acute infarction, hemorrhage, hydrocephalus, extra-axial collection or mass lesion/mass effect. Subcortical white matter and periventricular small vessel ischemic changes. Cortical volume is age-appropriate. Vascular: No hyperdense vessel or unexpected calcification. Skull: Normal. Negative for fracture or focal lesion. Sinuses/Orbits: The visualized paranasal sinuses are essentially clear. The mastoid air cells are unopacified. Other: None. CT CERVICAL SPINE FINDINGS Alignment: Normal cervical lordosis. Skull base and vertebrae: No acute fracture. No primary bone lesion or focal pathologic process. Soft tissues and spinal canal: No prevertebral fluid or swelling. No visible canal hematoma. Disc levels: Mild degenerative changes of the mid cervical spine. Spinal canal is patent. Upper chest: Evaluated on dedicated CT chest. Other: None. IMPRESSION: No acute intracranial abnormality. Small vessel ischemic changes. No traumatic injury to the cervical spine. Mild degenerative changes.  Electronically Signed   By: Pinkie Pebbles M.D.   On: 04/01/2023 21:17   DG Ribs Unilateral W/Chest Right Result Date: 04/01/2023 CLINICAL DATA:  Fall, right axillary pain EXAM: RIGHT RIBS AND CHEST - 3+ VIEW COMPARISON:  Chest radiographs dated 05/30/2022 FINDINGS: Lungs are essentially clear.  No pleural effusion or pneumothorax. The heart is normal in size.  Thoracic aortic atherosclerosis. Mildly displaced right lateral 9th rib fracture. IMPRESSION: Mildly displaced right lateral 9th rib fracture. No pneumothorax. Electronically Signed   By: Pinkie Pebbles M.D.   On: 04/01/2023 20:26    Procedures Procedures    Medications Ordered in ED Medications  diclofenac  (FLECTOR ) 1.3 % 1 patch (1 patch Transdermal Patch Applied 04/01/23 2139)  fentaNYL  (SUBLIMAZE ) injection 50 mcg (50 mcg Intravenous Given 04/01/23 1946)    ED Course/ Medical Decision Making/ A&P                                 Medical Decision Making Elderly female presents after probable mechanical fall with pain in the axilla.  Concern for thoracic trauma, rib fracture, pulmonary contusion.  The patient's initial physical exam with reassuring lower extremity findings suggest no hip fracture. Cardiac 65 sinus normal Pulse ox 97% room air normal Patient received analgesics, fentanyl  due to severe pain,  diclofenac  patch, x-ray, labs  Amount and/or Complexity of Data Reviewed Independent Historian: caregiver and EMS Labs: ordered. Decision-making details documented in ED Course. Radiology: ordered.  Risk Prescription drug management. Decision regarding hospitalization. Diagnosis or treatment significantly limited by social determinants of health.  On repeat exam I discussed the x-ray concerning for rib fracture, patient is somewhat better.  Family notes concern of head trauma following the fall, patient is unsure of whether or not this occurred, head CT will be ordered with chest CT which is pending. 9:46 PM CT  head, neck, with reviewed no acute fracture.  CT chest, with demonstration of fracture of the ninth rib, with additional evidence of fractures of ribs 10, 11, 12 with a flail chest pattern.  Patient has had proved pain control here, remains hemodynamically stable, but with concern for flail chest, I discussed her case with our trauma team, the patient be admitted to the hospitalist team for pain control with surgery following as a consulting service.  Final Clinical Impression(s) / ED Diagnoses Final diagnoses:  Fall, initial encounter  Closed fracture of multiple ribs with flail chest, initial encounter      Garrick Charleston, MD 04/01/23 2146

## 2023-04-01 NOTE — ED Triage Notes (Signed)
 BIBA from home for a slip and fall, no head injury or LOC. Back right rib pain with crepitus noted per EMS. 50 mcg fentanyl given PTA 180/86 BP 66 HR 98% room air

## 2023-04-02 ENCOUNTER — Inpatient Hospital Stay (HOSPITAL_COMMUNITY): Payer: PPO

## 2023-04-02 DIAGNOSIS — M79606 Pain in leg, unspecified: Secondary | ICD-10-CM

## 2023-04-02 DIAGNOSIS — D638 Anemia in other chronic diseases classified elsewhere: Secondary | ICD-10-CM | POA: Diagnosis not present

## 2023-04-02 DIAGNOSIS — W19XXXA Unspecified fall, initial encounter: Secondary | ICD-10-CM | POA: Diagnosis not present

## 2023-04-02 DIAGNOSIS — S2241XA Multiple fractures of ribs, right side, initial encounter for closed fracture: Secondary | ICD-10-CM

## 2023-04-02 DIAGNOSIS — E871 Hypo-osmolality and hyponatremia: Secondary | ICD-10-CM | POA: Diagnosis not present

## 2023-04-02 LAB — CBC WITH DIFFERENTIAL/PLATELET
Abs Immature Granulocytes: 0.04 10*3/uL (ref 0.00–0.07)
Basophils Absolute: 0 10*3/uL (ref 0.0–0.1)
Basophils Relative: 1 %
Eosinophils Absolute: 0.2 10*3/uL (ref 0.0–0.5)
Eosinophils Relative: 2 %
HCT: 32.3 % — ABNORMAL LOW (ref 36.0–46.0)
Hemoglobin: 10.4 g/dL — ABNORMAL LOW (ref 12.0–15.0)
Immature Granulocytes: 1 %
Lymphocytes Relative: 12 %
Lymphs Abs: 1 10*3/uL (ref 0.7–4.0)
MCH: 32.1 pg (ref 26.0–34.0)
MCHC: 32.2 g/dL (ref 30.0–36.0)
MCV: 99.7 fL (ref 80.0–100.0)
Monocytes Absolute: 0.7 10*3/uL (ref 0.1–1.0)
Monocytes Relative: 8 %
Neutro Abs: 6.6 10*3/uL (ref 1.7–7.7)
Neutrophils Relative %: 76 %
Platelets: 303 10*3/uL (ref 150–400)
RBC: 3.24 MIL/uL — ABNORMAL LOW (ref 3.87–5.11)
RDW: 13.4 % (ref 11.5–15.5)
WBC: 8.4 10*3/uL (ref 4.0–10.5)
nRBC: 0 % (ref 0.0–0.2)

## 2023-04-02 LAB — URINALYSIS, COMPLETE (UACMP) WITH MICROSCOPIC
Bilirubin Urine: NEGATIVE
Glucose, UA: NEGATIVE mg/dL
Hgb urine dipstick: NEGATIVE
Ketones, ur: NEGATIVE mg/dL
Nitrite: NEGATIVE
Protein, ur: NEGATIVE mg/dL
Specific Gravity, Urine: 1.014 (ref 1.005–1.030)
pH: 5 (ref 5.0–8.0)

## 2023-04-02 LAB — COMPREHENSIVE METABOLIC PANEL
ALT: 19 U/L (ref 0–44)
AST: 27 U/L (ref 15–41)
Albumin: 3.5 g/dL (ref 3.5–5.0)
Alkaline Phosphatase: 28 U/L — ABNORMAL LOW (ref 38–126)
Anion gap: 8 (ref 5–15)
BUN: 34 mg/dL — ABNORMAL HIGH (ref 8–23)
CO2: 22 mmol/L (ref 22–32)
Calcium: 9.4 mg/dL (ref 8.9–10.3)
Chloride: 101 mmol/L (ref 98–111)
Creatinine, Ser: 1.26 mg/dL — ABNORMAL HIGH (ref 0.44–1.00)
GFR, Estimated: 39 mL/min — ABNORMAL LOW (ref >60)
Glucose, Bld: 98 mg/dL (ref 70–99)
Potassium: 4.4 mmol/L (ref 3.5–5.1)
Sodium: 131 mmol/L — ABNORMAL LOW (ref 135–145)
Total Bilirubin: 0.6 mg/dL (ref 0.0–1.2)
Total Protein: 6.1 g/dL — ABNORMAL LOW (ref 6.5–8.1)

## 2023-04-02 LAB — APTT: aPTT: 31 s (ref 24–36)

## 2023-04-02 LAB — MAGNESIUM
Magnesium: 2.1 mg/dL (ref 1.7–2.4)
Magnesium: 2 mg/dL (ref 1.7–2.4)

## 2023-04-02 LAB — OSMOLALITY, URINE: Osmolality, Ur: 497 mosm/kg (ref 300–900)

## 2023-04-02 LAB — PROTIME-INR
INR: 1.1 (ref 0.8–1.2)
Prothrombin Time: 14.7 s (ref 11.4–15.2)

## 2023-04-02 LAB — OSMOLALITY: Osmolality: 295 mosm/kg (ref 275–295)

## 2023-04-02 LAB — CREATININE, URINE, RANDOM: Creatinine, Urine: 78 mg/dL

## 2023-04-02 LAB — VITAMIN B12: Vitamin B-12: 601 pg/mL (ref 180–914)

## 2023-04-02 LAB — SODIUM, URINE, RANDOM: Sodium, Ur: 74 mmol/L

## 2023-04-02 LAB — PHOSPHORUS: Phosphorus: 3.6 mg/dL (ref 2.5–4.6)

## 2023-04-02 LAB — TSH: TSH: 2.7 u[IU]/mL (ref 0.350–4.500)

## 2023-04-02 LAB — FOLATE: Folate: 4.4 ng/mL — ABNORMAL LOW (ref >5.9)

## 2023-04-02 LAB — URIC ACID: Uric Acid, Serum: 5.5 mg/dL (ref 2.5–7.1)

## 2023-04-02 MED ORDER — ACETAMINOPHEN 160 MG/5ML PO SOLN: 1000.0000 mg | Freq: Three times a day (TID) | ORAL | Status: DC

## 2023-04-02 MED ORDER — OXYCODONE HCL 5 MG PO TABS
2.5000 mg | ORAL_TABLET | ORAL | Status: DC | PRN
  Administered 2023-04-02 – 2023-04-04 (×6): 5 mg via ORAL
  Filled 2023-04-02 (×6): qty 1

## 2023-04-02 MED ORDER — FAMOTIDINE 20 MG PO TABS
20.0000 mg | ORAL_TABLET | Freq: Every day | ORAL | Status: DC
  Administered 2023-04-03 – 2023-04-05 (×3): 20 mg via ORAL
  Filled 2023-04-02 (×3): qty 1

## 2023-04-02 MED ORDER — BISACODYL 10 MG RE SUPP
10.0000 mg | Freq: Every day | RECTAL | Status: DC | PRN
  Administered 2023-04-04: 10 mg via RECTAL
  Filled 2023-04-02: qty 1

## 2023-04-02 MED ORDER — POLYETHYLENE GLYCOL 3350 17 G PO PACK
17.0000 g | PACK | Freq: Two times a day (BID) | ORAL | Status: DC
  Administered 2023-04-02 – 2023-04-05 (×7): 17 g via ORAL
  Filled 2023-04-02 (×7): qty 1

## 2023-04-02 MED ORDER — SENNOSIDES-DOCUSATE SODIUM 8.6-50 MG PO TABS
1.0000 | ORAL_TABLET | Freq: Two times a day (BID) | ORAL | Status: DC
Start: 2023-04-02 — End: 2023-04-05
  Administered 2023-04-02 – 2023-04-05 (×6): 1 via ORAL
  Filled 2023-04-02 (×6): qty 1

## 2023-04-02 MED ORDER — FOLIC ACID 1 MG PO TABS
1.0000 mg | ORAL_TABLET | Freq: Every day | ORAL | Status: DC
  Administered 2023-04-02 – 2023-04-05 (×4): 1 mg via ORAL
  Filled 2023-04-02 (×4): qty 1

## 2023-04-02 MED ORDER — METHOCARBAMOL 500 MG PO TABS
500.0000 mg | ORAL_TABLET | Freq: Three times a day (TID) | ORAL | Status: DC | PRN
  Administered 2023-04-02 – 2023-04-03 (×2): 500 mg via ORAL
  Filled 2023-04-02 (×2): qty 1

## 2023-04-02 MED ORDER — KETOROLAC TROMETHAMINE 15 MG/ML IJ SOLN
15.0000 mg | Freq: Once | INTRAMUSCULAR | Status: AC
  Administered 2023-04-02: 15 mg via INTRAVENOUS
  Filled 2023-04-02: qty 1

## 2023-04-02 MED ORDER — ACETAMINOPHEN 500 MG PO TABS
1000.0000 mg | ORAL_TABLET | Freq: Three times a day (TID) | ORAL | Status: DC
  Administered 2023-04-02 – 2023-04-05 (×9): 1000 mg via ORAL
  Filled 2023-04-02 (×10): qty 2

## 2023-04-02 MED ORDER — OXYCODONE-ACETAMINOPHEN 5-325 MG PO TABS: 1.0000 | ORAL_TABLET | Freq: Four times a day (QID) | ORAL | Status: DC | PRN

## 2023-04-02 MED ORDER — GUAIFENESIN ER 600 MG PO TB12: 600.0000 mg | ORAL_TABLET | Freq: Two times a day (BID) | ORAL | Status: DC | PRN

## 2023-04-02 MED ORDER — METHOCARBAMOL 500 MG PO TABS
500.0000 mg | ORAL_TABLET | Freq: Once | ORAL | Status: AC
  Administered 2023-04-02: 500 mg via ORAL
  Filled 2023-04-02: qty 1

## 2023-04-02 MED ORDER — ACETAMINOPHEN 650 MG RE SUPP: 975.0000 mg | Freq: Three times a day (TID) | RECTAL | Status: DC

## 2023-04-02 NOTE — Evaluation (Signed)
 Occupational Therapy Evaluation Patient Details Name: LESLIEANNE COBARRUBIAS MRN: [id number] DOB: [birth year] Today's Date: 04/02/2023   History of Present Illness Pt is a 89 y.o. female  who is admitted to [facility] on 04/01/2023 with multiple consecutive right-sided rib fractures after ground-level mechanical fall at home, after presenting from home to [facility] ED complaining of fall. PMH significant for essential hypertension, hyperlipidemia, GERD,   Clinical Impression   PTA, pt mod independent using RW. Completes bed mobility, transfers, mobility t/f bathroom using RW and toileting tasks with minA. Requires increased time due to rib discomfort. Pt would benefit from skilled OT services to address noted impairments and functional limitations (see below for any additional details) in order to maximize safety and independence while minimizing falls risk and caregiver burden. Patient will benefit from continued inpatient follow up therapy, <3 hours/day.    If plan is discharge home, recommend the following: A little help with walking and/or transfers;A little help with bathing/dressing/bathroom;Assistance with cooking/housework;Assist for transportation;Help with stairs or ramp for entrance    Functional Status Assessment  Patient has had a recent decline in their functional status and demonstrates the ability to make significant improvements in function in a reasonable and predictable amount of time.  Equipment Recommendations  Tub/shower bench;BSC/3in1       Precautions / Restrictions Precautions Precautions: Fall Precaution Comments: r rib fxs Restrictions Weight Bearing Restrictions Per Provider Order: No      Mobility Bed Mobility Overal bed mobility: Needs Assistance Bed Mobility: Supine to Sit, Sit to Supine     Supine to sit: HOB elevated, Min assist Sit to supine: HOB elevated, Min assist        Transfers Overall transfer level: Needs assistance Equipment used:  Rolling walker (2 wheels) Transfers: Sit to/from Stand, Bed to chair/wheelchair/BSC Sit to Stand: Min assist     Step pivot transfers: Min assist     General transfer comment: extra time needed for transfers      Balance Overall balance assessment: Needs assistance, History of Falls Sitting-balance support: No upper extremity supported, Feet supported Sitting balance-Leahy Scale: Fair     Standing balance support: Bilateral upper extremity supported, Reliant on assistive device for balance, During functional activity Standing balance-Leahy Scale: Poor                             ADL either performed or assessed with clinical judgement   ADL Overall ADL's : Needs assistance/impaired Eating/Feeding: Independent;Sitting   Grooming: Independent;Sitting   Upper Body Bathing: Minimal assistance;Sitting   Lower Body Bathing: Moderate assistance;Sit to/from stand   Upper Body Dressing : Minimal assistance;Sitting   Lower Body Dressing: Moderate assistance;Sit to/from stand   Toilet Transfer: Minimal assistance;Rolling walker (2 wheels);BSC/3in1;Ambulation;Grab bars Toilet Transfer Details (indicate cue type and reason): BSC over commode Toileting- Clothing Manipulation and Hygiene: Minimal assistance;Sit to/from stand       Functional mobility during ADLs: Minimal assistance;Rolling walker (2 wheels) General ADL Comments: Pt limited by pain.     Vision Baseline Vision/History: 1 Wears glasses Ability to See in Adequate Light: 0 Adequate Patient Visual Report: No change from baseline              Pertinent Vitals/Pain Pain Assessment Pain Assessment: Faces Faces Pain Scale: Hurts whole lot Pain Location: intermittent sharp pains Pain Descriptors / Indicators: Discomfort, Sharp Pain Intervention(s): Limited activity within patient's tolerance, Monitored during session     Extremity/Trunk Assessment Upper Extremity  Assessment Upper Extremity  Assessment: Generalized weakness;Right hand dominant   Lower Extremity Assessment Lower Extremity Assessment: Generalized weakness   Cervical / Trunk Assessment Cervical / Trunk Assessment: Normal;Other exceptions Cervical / Trunk Exceptions: limited trunk w/ rib fractures   Communication Communication Communication: No apparent difficulties   Cognition Arousal: Alert Behavior During Therapy: WFL for tasks assessed/performed Overall Cognitive Status: Within Functional Limits for tasks assessed                                                  Home Living Family/patient expects to be discharged to:: Private residence Living Arrangements: Spouse/significant other Available Help at Discharge: Family;Available PRN/intermittently Type of Home: House Home Access: Stairs to enter [employer] of Steps: 2 Entrance Stairs-Rails: Right Home Layout: One level     Bathroom Shower/Tub: Chief Strategy Officer: Standard Bathroom Accessibility: No   Home Equipment: Agricultural Consultant (2 wheels)          Prior Functioning/Environment Prior Level of Function : Independent/Modified Independent             Mobility Comments: uses RW ADLs Comments: mod independent        OT Problem List: Decreased activity tolerance;Decreased range of motion;Decreased strength;Impaired balance (sitting and/or standing);Pain;Cardiopulmonary status limiting activity;Decreased knowledge of precautions;Decreased knowledge of use of DME or AE      OT Treatment/Interventions: Self-care/ADL training;Energy conservation;DME and/or AE instruction;Patient/family education;Balance training;Therapeutic activities    OT Goals(Current goals can be found in the care plan section) Acute Rehab OT Goals OT Goal Formulation: With patient Time For Goal Achievement: 04/16/23 Potential to Achieve Goals: Good  OT Frequency: Min 1X/week       AM-PAC OT 6 Clicks Daily  Activity     Outcome Measure Help from another person eating meals?: None Help from another person taking care of personal grooming?: None Help from another person toileting, which includes using toliet, bedpan, or urinal?: A Little Help from another person bathing (including washing, rinsing, drying)?: A Little Help from another person to put on and taking off regular upper body clothing?: A Little Help from another person to put on and taking off regular lower body clothing?: A Little 6 Click Score: 20   End of Session Equipment Utilized During Treatment: Gait belt;Rolling walker (2 wheels) Nurse Communication: Mobility status  Activity Tolerance: No increased pain;Patient tolerated treatment well Patient left: in bed;with call bell/phone within reach;with bed alarm set  OT Visit Diagnosis: History of falling (Z91.81);Muscle weakness (generalized) (M62.81);Pain Pain - Right/Left: Right Pain - part of body:  (ribs)                Time: 8466-8395 OT Time Calculation (min): 31 min Charges:  OT General Charges $OT Visit: 1 Visit OT Evaluation $OT Eval Low Complexity: 1 Low OT Treatments $Self Care/Home Management : 8-22 mins  Darlen Gledhill L. Zakara Parkey, OTR/L  04/02/23, 4:20 PM

## 2023-04-02 NOTE — Plan of Care (Signed)

## 2023-04-02 NOTE — Consult Note (Signed)
 Susan Marks 11-05-1925  [id number].    Requesting MD: Dr. Lamar Salen Chief Complaint/Reason for Consult: fall, rib fractures  HPI:  Susan Marks is a 89 yo female who presented to the ED last night after a fall at home. She reports she was in the bathroom and slipped and fell, and hit the edge of the bathtub. She thinks this occurred several days ago. She did not hit her head and denies loss of consciousness. CT scans of the head, neck and chest showed multiple right posterior rib fractures, but no intracranial or C spine injuries. She is breathing comfortably on room air but says it is difficult to take deep breaths due to the pain.  ROS: Review of Systems  Constitutional:  Negative for chills and fever.  Respiratory:  Negative for shortness of breath.   Cardiovascular:  Negative for chest pain.  Gastrointestinal:  Negative for abdominal pain, nausea and vomiting.  Musculoskeletal:  Positive for falls.  Neurological:  Negative for loss of consciousness.    Family History  Problem Relation Age of Onset   Heart disease Father    Heart murmur Sister    Glaucoma Sister    Allergies Sister     Past Medical History:  Diagnosis Date   Chronic headaches    migraines   Chronic kidney disease    High blood pressure    High cholesterol     Past Surgical History:  Procedure Laterality Date   VESICOVAGINAL FISTULA CLOSURE W/ TAH      Social History:  reports that she has never smoked. She has never used smokeless tobacco. She reports current alcohol use. She reports that she does not use drugs.  Allergies:  Allergies  Allergen Reactions   Sulfa Antibiotics Other (See Comments)    REACTION: swollen under eyes and cheek   Alendronate     Other Reaction(s): stomach upset   Asa [Aspirin] Other (See Comments)    Kidney Problems   Lisinopril     cough   Other Swelling    Preservatives in eye drops   Statins     Leg cramps    Medications Prior to Admission   Medication Sig Dispense Refill   acetaminophen  (TYLENOL ) 650 MG CR tablet 2 tablets daily with breakfast Orally May take 2 more tablets as needed by the end of the day     ALPRAZolam  (XANAX ) 0.25 MG tablet Take 1-2 tabs (0.25mg -0.50mg ) 30-60 minutes before procedure. May repeat if needed.Do not drive. 4 tablet 0   Cholecalciferol 50 MCG (2000 UT) TABS Take 2,000 Units by mouth.     cyanocobalamin  1000 MCG tablet Take 1,000 mcg by mouth daily.     denosumab  (PROLIA ) 60 MG/ML SOLN injection Inject 60 mg into the skin every 6 (six) months. Administer in upper arm, thigh, or abdomen     donepezil  (ARICEPT ) 10 MG tablet Take 1 tablet (10 mg total) by mouth at bedtime. 90 tablet 3   famotidine  (PEPCID ) 40 MG tablet Take 40 mg by mouth daily.     fenofibrate  micronized (LOFIBRA) 200 MG capsule Take 200 mg by mouth at bedtime.     lidocaine  4 % as directed Externally     losartan  (COZAAR ) 100 MG tablet TAKE 1 TABLET BY MOUTH EVERY DAY 30 tablet 3     Physical Exam: Blood pressure (!) 156/65, pulse 61, temperature 98.4 F (36.9 C), temperature source Oral, resp. rate 17, height 4' 11 (1.499 m), weight 53.4 kg, SpO2 98%. General:  resting comfortably, no apparent distress Neurological: alert and oriented, no focal deficits, cranial nerves grossly in tact, pupils equal HEENT: normocephalic, atraumatic CV: extremities warm and well-perfused Respiratory: normal work of breathing on room air, symmetric chest wall expansion, no crepitus or chest wall deformities Abdomen: soft, nondistended, nontender to deep palpation. No masses or organomegaly. No abdominal wall ecchymoses or abrasions. Extremities: warm and well-perfused, no deformities, moving all extremities spontaneously Skin: warm and dry, no rashes or lesions    Assessment/Plan 89 yo female ground-level fall, with right rib fractures 9-12, with displacement and segmental fractures of ribs 9-11. - Aggressive pulmonary toilet. IS and flutter  valve. - Multimodal pain control: scheduled tylenol , prn robaxin  and low-dose oxycodone . - Mobilize as able. PT/OT ordered. - Monitor respiratory status closely - Remainder of care per primary team. Surgery will follow.  Leonor Dawn, MD [facility] Surgery General, Hepatobiliary and Pancreatic Surgery 04/02/23 7:14 AM

## 2023-04-02 NOTE — Hospital Course (Addendum)
 The patient is a 89 year old Caucasian female with a past medical history significant for but limited to essential hypertension, hyperlipidemia, GERD as well as other comorbidities who presented to [facility] after a ground-level fall and was found to have multiple consecutive right-sided rib fractures.  Trauma surgery was consulted and she is currently getting supportive care and incentive spirometry.  Pain is being controlled.  PT OT recommending SNF and will consult TOC for assistance with discharge disposition.  Of note she is also hyponatremic will need to continue monitor sodium carefully.  Assessment and Plan:  Rib Sided Fractures -Has multiple consecutive rib fractures after mechanical fall that she experienced without loss of consciousness -Involves ribs 9 through 12 with the ninth rib to the limits with right-sided rib fractures noted to be mildly displaced and segmental and suggestive of an element of flail chest segment -CT scan of the chest was done and showed potential hypoventilatory changes versus pulmonary contusion -Trauma surgery was consulted and recommending aggressive pulmonary toilet, incentive spirometry and flutter valve -Continuing with multimodal pain control with scheduled acetaminophen , as needed Robaxin  and low-dose oxycodone  but will consider going to 5 to 10 mg every 4 as needed for severe pain if not improving.  She has not been taking very much oxycodone . -She also has IV fentanyl  25 mcg every 2 as needed for severe pain as well as diclofenac  patch transdermally -We have ordered a lidocaine  patch to be applied to the ribs -PT OT recommending SNF -Continue monitor respiratory status carefully and continue mobilization and will need to try and get her pain better under control  Fall -Had a ground-level fall while ambulating to bathroom at home without loss of consciousness or head trauma -Is not any blood thinners as an outpatient has had multiple consecutive  right-sided rib fractures and potentially a pulmonary contusion -Imaging done and head CT without contrast showed no evidence of acute intracranial process and CT cervical spine showed no evidence of acute cervical spine fracture or subluxation injury -UA is negative -PT OT OT recommending SNF and patient is agreeable  Hypo-osmolar Hyponatremia -Euvolemic and Na+ slightly dropping  -Slight element of hypovolemia but could be also in the setting of SIADH -Not giving any more IV fluid hydration and getting a random urine sodium level and random urine osmolality -Strict I's and O's and daily weights -TSH was 2.700 -Na+ Trend: Recent Labs  Lab 04/01/23 2025 04/02/23 0408 04/03/23 0358  NA 132* 131* 128*  -Continue to Monitor and Trend and repeat CMP  Essential Hypertension -C/w Home Losartan  100 mg po Daily  -Continue to Monitor BP per Protocol -Last BP reading was 158/77  Leg pain -Bilateral leg pain was noted in lower extremity duplex done and showed no evidence of DVT  Hyperlipidemia -C/w Fenofibrate  160 mg po Daily   AKI on CKD Stage 3b Metabolic Acidosis -BUN/Cr Trend: Recent Labs  Lab 04/01/23 2025 04/02/23 0408 04/03/23 0358  BUN 36* 34* 35*  CREATININE 1.25* 1.26* 1.45*  -Avoid Nephrotoxic Medications, Contrast Dyes, Hypotension and Dehydration to Ensure Adequate Renal Perfusion and will need to Renally Adjust Meds -Patient has a slight metabolic acidosis with a CO2 of 21, anion gap of 8, chloride level of 99 -Continue to Monitor and Trend Renal Function carefully and repeat CMP in the AM   Macrocytic Anemia/Normocytic Anemia -Hemoglobin ranges around from 10-11 -Hgb/Hct Trend: Recent Labs  Lab 04/01/23 1951 04/02/23 0408 04/03/23 0358  HGB 11.3* 10.4* 10.0*  HCT 36.8 32.3* 30.5*  MCV  106.7* 99.7 97.8  -Check Anemia Panel in the AM,: Folate Level was 4.4 and B12 Level was 601 -Independent folic acid  level is low so we will start supplementation -Continue  to Monitor for S/Sx of Bleeding; No overt bleeding noted -Repeat CBC in the AM   Constipation -C/w Bowel Regimen  GERD/GI GI Prophylaxis -C/w Home Famotidine  20 mg po Daily   Hypoalbuminemia -Patient's Albumin Trend: Recent Labs  Lab 04/02/23 0408 04/03/23 0358  ALBUMIN 3.5 3.1*  -Continue to Monitor and Trend and repeat CMP in the AM

## 2023-04-02 NOTE — TOC Initial Note (Signed)
 Transition of Care [facility]) - Initial/Assessment Note   Patient Details  Name: Susan Marks MRN: [id number] Date of Birth: 30-Sep-1925  [facility]) CM/SW Contact:    Duwaine GORMAN Aran, LCSW Phone Number: 04/02/2023, 2:29 PM  Clinical Narrative: Patient is from home with spouse. PT/OT consulted. TOC awaiting recommendations from therapies.               Expected Discharge Plan:  (TBD) Barriers to Discharge: Continued Medical Work up  Expected Discharge Plan and Services In-house Referral: Clinical Social Work Living arrangements for the past 2 months: Single Family Home  Prior Living Arrangements/Services Living arrangements for the past 2 months: Single Family Home Lives with:: Spouse Patient language and need for interpreter reviewed:: Yes Do you feel safe going back to the place where you live?: Yes      Need for Family Participation in Patient Care: No (Comment) Care giver support system in place?: Yes (comment) Criminal Activity/Legal Involvement Pertinent to Current Situation/Hospitalization: No - Comment as needed  Activities of Daily Living ADL Screening (condition at time of admission) Independently performs ADLs?: Yes (appropriate for developmental age) Is the patient deaf or have difficulty hearing?: Yes Does the patient have difficulty seeing, even when wearing glasses/contacts?: No Does the patient have difficulty concentrating, remembering, or making decisions?: No  Emotional Assessment Orientation: : Oriented to Self, Oriented to Place, Oriented to  Time, Oriented to Situation Alcohol / Substance Use: Not Applicable Psych Involvement: No (comment)  Admission diagnosis:  Multiple rib fractures [S22.49XA] Fall, initial encounter [W19.XXXA] Closed fracture of multiple ribs with flail chest, initial encounter [S22.5XXA] Patient Active Problem List   Diagnosis Date Noted   Multiple rib fractures 04/01/2023   Fall at home, initial encounter 04/01/2023    Acute hyponatremia 04/01/2023   HLD (hyperlipidemia) 04/01/2023   Anemia of chronic disease 04/01/2023   Closed flail chest 04/01/2023   LBBB (left bundle branch block) 03/30/2018   Diverticulitis of intestine with perforation without abscess 03/30/2018   Chest pain 03/10/2014   Essential hypertension 03/10/2014   Abnormal EKG 03/10/2014   GERD (gastroesophageal reflux disease) 07/07/2013   Granulomatous lung disease (HCC) 07/07/2013   Cough 06/13/2013   PCP:  Verena Mems, MD Pharmacy:   [facility] [phone] [place], [place] - [address] AT [facility] OF [address] & [facility] [address] [place] [address] Phone: [phone] Fax: [phone]  Social Drivers of Health (SDOH) Social History: SDOH Screenings   Food Insecurity: No Food Insecurity (04/02/2023)  Housing: Low Risk  (04/02/2023)  Transportation Needs: No Transportation Needs (04/02/2023)  Utilities: Not At Risk (04/02/2023)  Social Connections: Moderately Integrated (04/02/2023)  Tobacco Use: Low Risk  (04/01/2023)   SDOH Interventions:    Readmission Risk Interventions     No data to display

## 2023-04-02 NOTE — Progress Notes (Signed)
 PROGRESS NOTE    Rosaleen Mazer Gross  FMW:[id number] DOB: 30-May-1925 DOA: 04/01/2023 PCP: Verena Mems, MD   Brief Narrative:  The patient is a 89 year old Caucasian female with a past medical history significant for but limited to essential hypertension, hyperlipidemia, GERD as well as other comorbidities who presented to [facility] after a ground-level fall and was found to have multiple consecutive right-sided rib fractures.  Trauma surgery was consulted and she is currently getting supportive care and incentive spirometry.  Pain is being controlled.  PT OT recommending SNF and will consult TOC for assistance with discharge disposition.  Of note she is also hyponatremic and slightly drops will need to monitor sodium carefully.  Assessment and Plan:  Rib Sided Fractures -Has multiple consecutive rib fractures after mechanical fall that she experienced without loss of consciousness -Involves ribs 9 through 12 with the ninth rib to the limits with right-sided rib fractures noted to be mildly displaced and segmental and suggestive of an element of flail chest segment -CT scan of the chest was done and showed potential hypoventilatory changes versus pulmonary contusion -Trauma surgery was consulted and recommending aggressive pulmonary toilet, incentive spirometry and flutter valve -Continuing with multimodal pain control with scheduled acetaminophen , as needed Robaxin  and low-dose oxycodone  -PT OT recommending SNF -Continue monitor respiratory status carefully  Fall -Had a ground-level fall while ambulating to bathroom at home without loss of consciousness or head trauma -Is not any blood thinners as an outpatient has had multiple consecutive right-sided rib fractures and potentially a pulmonary contusion -Imaging done and head CT without contrast showed no evidence of acute intracranial process and CT cervical spine showed no evidence of acute cervical spine fracture or subluxation  injury -UA is negative -PT OT OT recommending SNF  Hypo-osmolar Hyponatremia -Euvolemic -Slight element of hypovolemia but could be also in the setting of SIADH -Not giving any more IV fluid hydration and getting a random urine sodium level and random urine osmolality -Strict I's and O's and daily weights -Na+ Trend: Recent Labs  Lab 04/01/23 2025 04/02/23 0408  NA 132* 131*  -Continue to Monitor and Trend and repeat CMP  Essential Hypertension -C/w Home Losartan  100 mg po Daily  -Continue to Monitor BP per Protocol -Last BP reading was 148/77  Leg pain -Bilateral leg pain was noted in lower extremity duplex done and showed no evidence of DVT  Hyperlipidemia -C/w Fenofibrate  160 mg po Daily   CKD Stage 3b -BUN/Cr Trend: Recent Labs  Lab 04/01/23 2025 04/02/23 0408  BUN 36* 34*  CREATININE 1.25* 1.26*  -Avoid Nephrotoxic Medications, Contrast Dyes, Hypotension and Dehydration to Ensure Adequate Renal Perfusion and will need to Renally Adjust Meds -Continue to Monitor and Trend Renal Function carefully and repeat CMP in the AM   Macrocytic Anemia/Normocytic Anemia -Hemoglobin ranges around from 10-11 -Hgb/Hct Trend: Recent Labs  Lab 04/01/23 1951 04/02/23 0408  HGB 11.3* 10.4*  HCT 36.8 32.3*  MCV 106.7* 99.7  -Check Anemia Panel in the AM,: Folate Level was 4.4 and B12 Level was 601 -Independent folic acid  level is low so we will start supplementation -Continue to Monitor for S/Sx of Bleeding; No overt bleeding noted -Repeat CBC in the AM   Constipation -C/w Bowel Regimen  GERD/GI GI Prophylaxis -C/w Home Famotidine  20 mg po Daily    DVT prophylaxis: SCDs Start: 04/01/23 2238    Code Status: Full Code Family Communication: Discussed with family at bedside  Disposition Plan:  Level of care: Progressive Status is: Inpatient  Remains inpatient appropriate because: need Further clinical improvement and will need SNF with informed authorization    Consultants:  Trauma Surgery  Procedures:  As delineated as above  Antimicrobials:  Anti-infectives (From admission, onward)    None       Subjective: Seen and examined at bedside and she was having some chest discomfort whenever she takes a deep breath in.  No nausea or vomiting.  Feels okay.  90 chest pain or shortness of breath.  Feels constipated.  Objective: Vitals:   04/02/23 0354 04/02/23 0644 04/02/23 1303 04/02/23 2013  BP:  (!) 156/65 (!) 141/58 (!) 148/77  Pulse:  61 61 63  Resp:  17 18 18   Temp:  98.4 F (36.9 C) (!) 97.4 F (36.3 C) 97.9 F (36.6 C)  TempSrc:  Oral Oral Oral  SpO2:  98% 95% 94%  Weight: 53.4 kg     Height:       No intake or output data in the 24 hours ending 04/02/23 2059 Filed Weights   04/02/23 0354  Weight: 53.4 kg   Examination: Physical Exam:  Constitutional: Elderly Caucasian female in no acute distress Respiratory: Diminished to auscultation bilaterally, no wheezing, rales, rhonchi or crackles. Normal respiratory effort and patient is not tachypenic. No accessory muscle use.  Unlabored breathing Cardiovascular: RRR, no murmurs / rubs / gallops. S1 and S2 auscultated. No extremity edema. Abdomen: Soft, non-tender, distended secondary body habitus. Bowel sounds positive.  GU: Deferred. Musculoskeletal: No clubbing / cyanosis of digits/nails. No joint deformity upper and lower extremities.  Skin: No rashes, lesions, ulcers on a limited skin evaluation. No induration; Warm and dry.  Neurologic: CN 2-12 grossly intact with no focal deficits. Romberg sign and cerebellar reflexes not assessed.  Psychiatric: Normal judgment and insight. Alert and oriented x 3. Normal mood and appropriate affect.   Data Reviewed: I have personally reviewed following labs and imaging studies  CBC: Recent Labs  Lab 04/01/23 1951 04/02/23 0408  WBC 6.2 8.4  NEUTROABS 4.3 6.6  HGB 11.3* 10.4*  HCT 36.8 32.3*  MCV 106.7* 99.7  PLT 228 303    Basic Metabolic Panel: Recent Labs  Lab 04/01/23 2025 04/02/23 0408 04/02/23 0409  NA 132* 131*  --   K 4.5 4.4  --   CL 102 101  --   CO2 20* 22  --   GLUCOSE 92 98  --   BUN 36* 34*  --   CREATININE 1.25* 1.26*  --   CALCIUM 9.5 9.4  --   MG  --  2.0 2.1  PHOS  --  3.6  --    GFR: Estimated Creatinine Clearance: 19.9 mL/min (A) (by C-G formula based on SCr of 1.26 mg/dL (H)). Liver Function Tests: Recent Labs  Lab 04/02/23 0408  AST 27  ALT 19  ALKPHOS 28*  BILITOT 0.6  PROT 6.1*  ALBUMIN 3.5   No results for input(s): LIPASE, AMYLASE in the last 168 hours. No results for input(s): AMMONIA in the last 168 hours. Coagulation Profile: Recent Labs  Lab 04/02/23 0408  INR 1.1   Cardiac Enzymes: No results for input(s): CKTOTAL, CKMB, CKMBINDEX, TROPONINI in the last 168 hours. BNP (last 3 results) No results for input(s): PROBNP in the last 8760 hours. HbA1C: No results for input(s): HGBA1C in the last 72 hours. CBG: No results for input(s): GLUCAP in the last 168 hours. Lipid Profile: No results for input(s): CHOL, HDL, LDLCALC, TRIG, CHOLHDL, LDLDIRECT in the last 72 hours.  Thyroid  Function Tests: Recent Labs    04/02/23 0408  TSH 2.700   Anemia Panel: Recent Labs    04/02/23 0408 04/02/23 0409  VITAMINB12 601  --   FOLATE  --  4.4*   Sepsis Labs: No results for input(s): PROCALCITON, LATICACIDVEN in the last 168 hours.  No results found for this or any previous visit (from the past 240 hours).   Radiology Studies: VAS US  LOWER EXTREMITY VENOUS (DVT) Result Date: 04/02/2023  Lower Venous DVT Study Patient Name:  LURETHA EBERLY  Date of Exam:   04/02/2023 Medical Rec #: [id number]          Accession #:    [id number] Date of Birth: 09/26/1925          Patient Gender: F Patient Age:   48 years Exam Location:  [facility] Procedure:      VAS US  LOWER EXTREMITY VENOUS (DVT) Referring Phys: JUSTIN HOWERTER  --------------------------------------------------------------------------------  Indications: Pain. Other Indications: Fall on 04/01/2023. Comparison Study: No previous exams Performing Technologist: Jody Hill RVT, RDMS  Examination Guidelines: A complete evaluation includes B-mode imaging, spectral Doppler, color Doppler, and power Doppler as needed of all accessible portions of each vessel. Bilateral testing is considered an integral part of a complete examination. Limited examinations for reoccurring indications may be performed as noted. The reflux portion of the exam is performed with the patient in reverse Trendelenburg.  +---------+---------------+---------+-----------+----------+--------------+ RIGHT    CompressibilityPhasicitySpontaneityPropertiesThrombus Aging +---------+---------------+---------+-----------+----------+--------------+ CFV      Full           Yes      Yes                                 +---------+---------------+---------+-----------+----------+--------------+ SFJ      Full                                                        +---------+---------------+---------+-----------+----------+--------------+ FV Prox  Full           Yes      Yes                                 +---------+---------------+---------+-----------+----------+--------------+ FV Mid   Full           Yes      Yes                                 +---------+---------------+---------+-----------+----------+--------------+ FV DistalFull           Yes      Yes                                 +---------+---------------+---------+-----------+----------+--------------+ PFV      Full                                                        +---------+---------------+---------+-----------+----------+--------------+ POP  Full           Yes      Yes                                 +---------+---------------+---------+-----------+----------+--------------+ PTV      Full                                                         +---------+---------------+---------+-----------+----------+--------------+ PERO     Full                                                        +---------+---------------+---------+-----------+----------+--------------+   +---------+---------------+---------+-----------+----------+--------------+ LEFT     CompressibilityPhasicitySpontaneityPropertiesThrombus Aging +---------+---------------+---------+-----------+----------+--------------+ CFV      Full           Yes      Yes                                 +---------+---------------+---------+-----------+----------+--------------+ SFJ      Full                                                        +---------+---------------+---------+-----------+----------+--------------+ FV Prox  Full           Yes      Yes                                 +---------+---------------+---------+-----------+----------+--------------+ FV Mid   Full           Yes      Yes                                 +---------+---------------+---------+-----------+----------+--------------+ FV DistalFull           Yes      Yes                                 +---------+---------------+---------+-----------+----------+--------------+ PFV      Full                                                        +---------+---------------+---------+-----------+----------+--------------+ POP      Full           Yes      Yes                                 +---------+---------------+---------+-----------+----------+--------------+ PTV      Full                                                        +---------+---------------+---------+-----------+----------+--------------+  PERO     Full                                                        +---------+---------------+---------+-----------+----------+--------------+     Summary: BILATERAL: - No evidence of deep vein thrombosis seen in  the lower extremities, bilaterally. - RIGHT: - A cystic structure is found in the popliteal fossa (2.3 x 1.0 x 2.1 cm)..  LEFT: - No cystic structure found in the popliteal fossa.  *See table(s) above for measurements and observations. Electronically signed by Lonni Gaskins MD on 04/02/2023 at 3:03:49 PM.    Final    CT Chest Wo Contrast Result Date: 04/01/2023 CLINICAL DATA:  Slip and fall.  Right rib fracture. EXAM: CT CHEST WITHOUT CONTRAST TECHNIQUE: Multidetector CT imaging of the chest was performed following the standard protocol without IV contrast. RADIATION DOSE REDUCTION: This exam was performed according to the departmental dose-optimization program which includes automated exposure control, adjustment of the mA and/or kV according to patient size and/or use of iterative reconstruction technique. COMPARISON:  Rib radiographs earlier today FINDINGS: Cardiovascular: The heart is upper normal in size. There are coronary artery calcifications. No pericardial effusion. Moderate atherosclerosis of the thoracic aorta. No periaortic stranding. Aortic tortuosity but no aneurysm. Mediastinum/Nodes: No mediastinal hematoma or hemorrhage. Coarsely calcified mediastinal and right hilar lymph nodes. No noncalcified adenopathy. Small hiatal hernia. Lungs/Pleura: No pneumothorax. Right pleural thickening in the dependent lower lobe adjacent to rib fractures. There is also minimal pleural thickening in the posterior left lower lobe. Patchy areas of ground-glass in the dependent lower lobes may represent hypoventilatory change or contusion. There are multiple benign calcified granuloma, needing no further imaging follow-up. Upper Abdomen: Calcified splenic granuloma. No free fluid or evidence of traumatic injury. Musculoskeletal: Right rib fractures 9 through 12. The ninth through eleventh rib fractures are displaced and segmental. There is adjacent subcutaneous emphysema in the right lateral chest wall. Scoliotic  curvature of the thoracic spine without acute thoracic spine fracture. The sternum, included clavicles and shoulder girdles are intact. IMPRESSION: 1. Right rib fractures 9 through 12. The ninth through eleventh rib fractures are displaced and segmental. This is consistent with a flail chest segment. 2. Right pleural thickening in the dependent lower lobe adjacent to rib fractures. There is also minimal pleural thickening in the posterior left lower lobe. No pneumothorax. 3. Patchy areas of ground-glass in the dependent lower lobes may represent hypoventilatory change or contusion. 4. Coronary artery calcifications. Aortic Atherosclerosis (ICD10-I70.0). 5. Sequela of prior granulomatous disease. Electronically Signed   By: Andrea Gasman M.D.   On: 04/01/2023 21:23   CT Head Wo Contrast Result Date: 04/01/2023 CLINICAL DATA:  Trauma EXAM: CT HEAD WITHOUT CONTRAST CT CERVICAL SPINE WITHOUT CONTRAST TECHNIQUE: Multidetector CT imaging of the head and cervical spine was performed following the standard protocol without intravenous contrast. Multiplanar CT image reconstructions of the cervical spine were also generated. RADIATION DOSE REDUCTION: This exam was performed according to the departmental dose-optimization program which includes automated exposure control, adjustment of the mA and/or kV according to patient size and/or use of iterative reconstruction technique. COMPARISON:  None Available. FINDINGS: CT HEAD FINDINGS Brain: No evidence of acute infarction, hemorrhage, hydrocephalus, extra-axial collection or mass lesion/mass effect. Subcortical white matter and periventricular small vessel ischemic changes. Cortical volume is age-appropriate. Vascular:  No hyperdense vessel or unexpected calcification. Skull: Normal. Negative for fracture or focal lesion. Sinuses/Orbits: The visualized paranasal sinuses are essentially clear. The mastoid air cells are unopacified. Other: None. CT CERVICAL SPINE FINDINGS  Alignment: Normal cervical lordosis. Skull base and vertebrae: No acute fracture. No primary bone lesion or focal pathologic process. Soft tissues and spinal canal: No prevertebral fluid or swelling. No visible canal hematoma. Disc levels: Mild degenerative changes of the mid cervical spine. Spinal canal is patent. Upper chest: Evaluated on dedicated CT chest. Other: None. IMPRESSION: No acute intracranial abnormality. Small vessel ischemic changes. No traumatic injury to the cervical spine. Mild degenerative changes. Electronically Signed   By: Pinkie Pebbles M.D.   On: 04/01/2023 21:17   CT Cervical Spine Wo Contrast Result Date: 04/01/2023 CLINICAL DATA:  Trauma EXAM: CT HEAD WITHOUT CONTRAST CT CERVICAL SPINE WITHOUT CONTRAST TECHNIQUE: Multidetector CT imaging of the head and cervical spine was performed following the standard protocol without intravenous contrast. Multiplanar CT image reconstructions of the cervical spine were also generated. RADIATION DOSE REDUCTION: This exam was performed according to the departmental dose-optimization program which includes automated exposure control, adjustment of the mA and/or kV according to patient size and/or use of iterative reconstruction technique. COMPARISON:  None Available. FINDINGS: CT HEAD FINDINGS Brain: No evidence of acute infarction, hemorrhage, hydrocephalus, extra-axial collection or mass lesion/mass effect. Subcortical white matter and periventricular small vessel ischemic changes. Cortical volume is age-appropriate. Vascular: No hyperdense vessel or unexpected calcification. Skull: Normal. Negative for fracture or focal lesion. Sinuses/Orbits: The visualized paranasal sinuses are essentially clear. The mastoid air cells are unopacified. Other: None. CT CERVICAL SPINE FINDINGS Alignment: Normal cervical lordosis. Skull base and vertebrae: No acute fracture. No primary bone lesion or focal pathologic process. Soft tissues and spinal canal: No  prevertebral fluid or swelling. No visible canal hematoma. Disc levels: Mild degenerative changes of the mid cervical spine. Spinal canal is patent. Upper chest: Evaluated on dedicated CT chest. Other: None. IMPRESSION: No acute intracranial abnormality. Small vessel ischemic changes. No traumatic injury to the cervical spine. Mild degenerative changes. Electronically Signed   By: Pinkie Pebbles M.D.   On: 04/01/2023 21:17   DG Ribs Unilateral W/Chest Right Result Date: 04/01/2023 CLINICAL DATA:  Fall, right axillary pain EXAM: RIGHT RIBS AND CHEST - 3+ VIEW COMPARISON:  Chest radiographs dated 05/30/2022 FINDINGS: Lungs are essentially clear.  No pleural effusion or pneumothorax. The heart is normal in size.  Thoracic aortic atherosclerosis. Mildly displaced right lateral 9th rib fracture. IMPRESSION: Mildly displaced right lateral 9th rib fracture. No pneumothorax. Electronically Signed   By: Pinkie Pebbles M.D.   On: 04/01/2023 20:26   Scheduled Meds:  acetaminophen   1,000 mg Oral Q8H   Or   acetaminophen  (TYLENOL ) oral liquid 160 mg/5 mL  1,000 mg Per Tube Q8H   Or   acetaminophen   975 mg Rectal Q8H   diclofenac   1 patch Transdermal BID   donepezil   10 mg Oral QHS   [START ON 04/03/2023] famotidine   20 mg Oral Daily   fenofibrate   160 mg Oral Daily   folic acid   1 mg Oral Daily   losartan   100 mg Oral Daily   polyethylene glycol  17 g Oral BID   senna-docusate  1 tablet Oral BID   Continuous Infusions:   LOS: 1 day   Alejandro Marker, DO [facility] Available via Epic secure chat 7am-7pm After these hours, please refer to coverage provider listed on [email or web address] 04/02/2023, 8:59 PM

## 2023-04-02 NOTE — Progress Notes (Signed)
 BLE venous duplex has been completed.   Results can be found under chart review under CV PROC. 04/02/2023 2:56 PM Natally Ribera RVT, RDMS

## 2023-04-02 NOTE — Evaluation (Signed)
 Physical Therapy Evaluation Patient Details Name: CONSUELA WIDENER MRN: [id number] DOB: 1926/01/16 Today's Date: 04/02/2023  History of Present Illness  Valerye Kobus Vath is a 89 y.o. female  who is admitted to [facility] on 04/01/2023 with multiple consecutive right-sided rib fractures after ground-level mechanical fall at home, after presenting from home to [facility] ED complaining of fall.PMH: essential hypertension, hyperlipidemia, GERD,  Clinical Impression  Pt admitted with above diagnosis.  Pt currently with functional limitations due to the deficits listed below (see PT Problem List). Pt will benefit from acute skilled PT to increase their independence and safety with mobility to allow discharge.     The patient reports feeling some better. Reports intermittent  right sid pain, noted while ambulating with sligt leg buckling. Patient reports independent with Rw PTA.  Patient reports family  local and may be able to assist after Dc.  Patient will benefit from continued inpatient follow up therapy, <3 hours/day unless family will be availble. Patient states spouse may  not be able to provide needed assistance .        If plan is discharge home, recommend the following: A little help with walking and/or transfers;A little help with bathing/dressing/bathroom;Assist for transportation;Help with stairs or ramp for entrance   Can travel by private vehicle   Yes    Equipment Recommendations None recommended by PT  Recommendations for Other Services       Functional Status Assessment Patient has had a recent decline in their functional status and demonstrates the ability to make significant improvements in function in a reasonable and predictable amount of time.     Precautions / Restrictions Precautions Precautions: Fall Precaution Comments: r rib fxs Restrictions Weight Bearing Restrictions Per Provider Order: No      Mobility  Bed Mobility Overal bed mobility: Needs  Assistance Bed Mobility: Supine to Sit, Sit to Supine     Supine to sit: HOB elevated Sit to supine: HOB elevated   General bed mobility comments: extra time and  attempted various maneuvres for rib protection, did  pull up to sit   and assisted right leg onto bed    Transfers Overall transfer level: Needs assistance Equipment used: Rolling walker (2 wheels) Transfers: Sit to/from Stand Sit to Stand: Min assist           General transfer comment: extra time to rise up    Ambulation/Gait Ambulation/Gait assistance: Min assist Gait Distance (Feet): 90 Feet Assistive device: Rolling walker (2 wheels) Gait Pattern/deviations: Step-to pattern, Step-through pattern Gait velocity: decr     General Gait Details: intermittent stops with reports of right side pains  Stairs            Wheelchair Mobility     Tilt Bed    Modified Rankin (Stroke Patients Only)       Balance Overall balance assessment: Needs assistance, History of Falls Sitting-balance support: No upper extremity supported, Feet supported Sitting balance-Leahy Scale: Fair     Standing balance support: Bilateral upper extremity supported, Reliant on assistive device for balance, During functional activity Standing balance-Leahy Scale: Poor                               Pertinent Vitals/Pain Pain Assessment Pain Assessment: Faces Faces Pain Scale: Hurts whole lot Pain Location: intermittent sharp pains Pain Descriptors / Indicators: Discomfort, Sharp Pain Intervention(s): Monitored during session, Premedicated before session, Repositioned    Home Living  Family/patient expects to be discharged to:: Private residence Living Arrangements: Spouse/significant other Available Help at Discharge: Family;Available PRN/intermittently Type of Home: House Home Access: Stairs to enter       Home Layout: One level Home Equipment: Agricultural Consultant (2 wheels)      Prior Function Prior Level  of Function : Independent/Modified Independent             Mobility Comments: uses RW, does not fit into BR       Extremity/Trunk Assessment   Upper Extremity Assessment Upper Extremity Assessment: Defer to OT evaluation    Lower Extremity Assessment Lower Extremity Assessment: Generalized weakness    Cervical / Trunk Assessment Cervical / Trunk Assessment: Normal;Other exceptions Cervical / Trunk Exceptions: limited trunk w/ rib fractures  Communication   Communication Communication: No apparent difficulties  Cognition Arousal: Alert Behavior During Therapy: WFL for tasks assessed/performed Overall Cognitive Status: Within Functional Limits for tasks assessed                                          General Comments      Exercises     Assessment/Plan    PT Assessment Patient needs continued PT services  PT Problem List Decreased strength;Decreased balance;Decreased knowledge of precautions;Decreased range of motion;Decreased mobility;Pain;Decreased activity tolerance;Decreased safety awareness       PT Treatment Interventions DME instruction;Therapeutic activities;Gait training;Therapeutic exercise;Patient/family education;Functional mobility training    PT Goals (Current goals can be found in the Care Plan section)  Acute Rehab PT Goals Patient Stated Goal: go home PT Goal Formulation: With patient Time For Goal Achievement: 04/16/23 Potential to Achieve Goals: Good    Frequency Min 1X/week     Co-evaluation               AM-PAC PT 6 Clicks Mobility  Outcome Measure Help needed turning from your back to your side while in a flat bed without using bedrails?: A Little Help needed moving from lying on your back to sitting on the side of a flat bed without using bedrails?: A Lot Help needed moving to and from a bed to a chair (including a wheelchair)?: A Lot Help needed standing up from a chair using your arms (e.g., wheelchair  or bedside chair)?: A Little Help needed to walk in hospital room?: A Little Help needed climbing 3-5 steps with a railing? : A Lot 6 Click Score: 15    End of Session Equipment Utilized During Treatment: Gait belt Activity Tolerance: Patient tolerated treatment well Patient left: in bed;with call bell/phone within reach;with bed alarm set Nurse Communication: Mobility status PT Visit Diagnosis: Unsteadiness on feet (R26.81);Difficulty in walking, not elsewhere classified (R26.2);Pain;Repeated falls (R29.6)    Time: 8585-8556 PT Time Calculation (min) (ACUTE ONLY): 29 min   Charges:   PT Evaluation $PT Eval Low Complexity: 1 Low PT Treatments $Gait Training: 8-22 mins PT General Charges $$ ACUTE PT VISIT: 1 Visit         Darice Potters PT Acute Rehabilitation Services Office [phone] Weekend pager-[phone]   Potters Darice Norris 04/02/2023, 3:32 PM

## 2023-04-02 NOTE — ED Notes (Addendum)
 Pt had to urinate but Pt states she was "in a lot of pain" & she was visibly unable to sit up or roll to be placed onto bedpan; NT placed Purewick on Pt per RN approval.

## 2023-04-03 LAB — COMPREHENSIVE METABOLIC PANEL
ALT: 16 U/L (ref 0–44)
AST: 26 U/L (ref 15–41)
Albumin: 3.1 g/dL — ABNORMAL LOW (ref 3.5–5.0)
Alkaline Phosphatase: 26 U/L — ABNORMAL LOW (ref 38–126)
Anion gap: 8 (ref 5–15)
BUN: 35 mg/dL — ABNORMAL HIGH (ref 8–23)
CO2: 21 mmol/L — ABNORMAL LOW (ref 22–32)
Calcium: 9.2 mg/dL (ref 8.9–10.3)
Chloride: 99 mmol/L (ref 98–111)
Creatinine, Ser: 1.45 mg/dL — ABNORMAL HIGH (ref 0.44–1.00)
GFR, Estimated: 33 mL/min — ABNORMAL LOW (ref >60)
Glucose, Bld: 103 mg/dL — ABNORMAL HIGH (ref 70–99)
Potassium: 4.2 mmol/L (ref 3.5–5.1)
Sodium: 128 mmol/L — ABNORMAL LOW (ref 135–145)
Total Bilirubin: 0.7 mg/dL (ref 0.0–1.2)
Total Protein: 5.9 g/dL — ABNORMAL LOW (ref 6.5–8.1)

## 2023-04-03 LAB — MAGNESIUM: Magnesium: 2 mg/dL (ref 1.7–2.4)

## 2023-04-03 LAB — CBC WITH DIFFERENTIAL/PLATELET
Abs Immature Granulocytes: 0.02 10*3/uL (ref 0.00–0.07)
Basophils Absolute: 0 10*3/uL (ref 0.0–0.1)
Basophils Relative: 0 %
Eosinophils Absolute: 0.2 10*3/uL (ref 0.0–0.5)
Eosinophils Relative: 4 %
HCT: 30.5 % — ABNORMAL LOW (ref 36.0–46.0)
Hemoglobin: 10 g/dL — ABNORMAL LOW (ref 12.0–15.0)
Immature Granulocytes: 0 %
Lymphocytes Relative: 18 %
Lymphs Abs: 1 10*3/uL (ref 0.7–4.0)
MCH: 32.1 pg (ref 26.0–34.0)
MCHC: 32.8 g/dL (ref 30.0–36.0)
MCV: 97.8 fL (ref 80.0–100.0)
Monocytes Absolute: 0.6 10*3/uL (ref 0.1–1.0)
Monocytes Relative: 11 %
Neutro Abs: 3.7 10*3/uL (ref 1.7–7.7)
Neutrophils Relative %: 67 %
Platelets: 284 10*3/uL (ref 150–400)
RBC: 3.12 MIL/uL — ABNORMAL LOW (ref 3.87–5.11)
RDW: 13.4 % (ref 11.5–15.5)
WBC: 5.4 10*3/uL (ref 4.0–10.5)
nRBC: 0 % (ref 0.0–0.2)

## 2023-04-03 LAB — PHOSPHORUS: Phosphorus: 4.6 mg/dL (ref 2.5–4.6)

## 2023-04-03 MED ORDER — ORAL CARE MOUTH RINSE: 15.0000 mL | OROMUCOSAL | Status: DC | PRN

## 2023-04-03 MED ORDER — METHOCARBAMOL 500 MG PO TABS
1000.0000 mg | ORAL_TABLET | Freq: Three times a day (TID) | ORAL | Status: DC | PRN
  Administered 2023-04-03: 1000 mg via ORAL
  Filled 2023-04-03: qty 2

## 2023-04-03 MED ORDER — LIDOCAINE 5 % EX PTCH
1.0000 | MEDICATED_PATCH | CUTANEOUS | Status: DC
Start: 2023-04-03 — End: 2023-04-05
  Administered 2023-04-03 – 2023-04-04 (×2): 1 via TRANSDERMAL
  Filled 2023-04-03 (×2): qty 1

## 2023-04-03 NOTE — [place] FL2 (Signed)
 [place]  MEDICAID FL2 LEVEL OF CARE FORM     IDENTIFICATION  Patient Name: Susan Marks Birthdate: January 18, 1926 Sex: female Admission Date (Current Location): 04/01/2023  [facility] and [place] Number:  Producer, Television/film/video and Address:  [facility] [address],  [address] [place], [place] [address]      Provider Number: [id number]  Attending Physician Name and Address:  Sherrill Alejandro Donovan, DO  Relative Name and Phone Number:  [name] (son) Ph: [phone]    Current Level of Care: Hospital Recommended Level of Care: Skilled Nursing Facility Prior Approval Number:    Date Approved/Denied:   PASRR Number: [id number]  Discharge Plan: SNF    Current Diagnoses: Patient Active Problem List   Diagnosis Date Noted   Multiple rib fractures 04/01/2023   Fall at home, initial encounter 04/01/2023   Acute hyponatremia 04/01/2023   HLD (hyperlipidemia) 04/01/2023   Anemia of chronic disease 04/01/2023   Closed flail chest 04/01/2023   LBBB (left bundle branch block) 03/30/2018   Diverticulitis of intestine with perforation without abscess 03/30/2018   Chest pain 03/10/2014   Essential hypertension 03/10/2014   Abnormal EKG 03/10/2014   GERD (gastroesophageal reflux disease) 07/07/2013   Granulomatous lung disease (HCC) 07/07/2013   Cough 06/13/2013    Orientation RESPIRATION BLADDER Height & Weight     Self, Time, Situation, Place  Normal Continent Weight: 117 lb 15.1 oz (53.5 kg) Height:  4' 11 (149.9 cm)  BEHAVIORAL SYMPTOMS/MOOD NEUROLOGICAL BOWEL NUTRITION STATUS      Continent Diet (Regular diet)  AMBULATORY STATUS COMMUNICATION OF NEEDS Skin   Limited Assist Verbally Other (Comment) (Ecchymosis: left arm; Erythema: buttocks)                       Personal Care Assistance Level of Assistance  Bathing, Feeding, Dressing Bathing Assistance: Limited assistance Feeding assistance: Independent Dressing Assistance: Limited assistance     Functional  Limitations Info  Sight, Hearing, Speech Sight Info: Impaired Hearing Info: Adequate Speech Info: Adequate    SPECIAL CARE FACTORS FREQUENCY  PT (By licensed PT), OT (By licensed OT)     PT Frequency: 5x's/week OT Frequency: 5x's/week            Contractures Contractures Info: Not present    Additional Factors Info  Code Status, Allergies Code Status Info: Full Allergies Info: Sulfa Antibiotics, Alendronate, Asa (Aspirin), Azithromycin, Lisinopril, Other, Statins           Current Medications (04/03/2023):  This is the current hospital active medication list Current Facility-Administered Medications  Medication Dose Route Frequency Provider Last Rate Last Admin   acetaminophen  (TYLENOL ) tablet 1,000 mg  1,000 mg Oral Q8H Dasie Best L, MD   1,000 mg at 04/03/23 9091   Or   acetaminophen  (TYLENOL ) 160 MG/5ML solution 1,000 mg  1,000 mg Per Tube Q8H Dasie Best CROME, MD       Or   acetaminophen  (TYLENOL ) suppository 975 mg  975 mg Rectal Q8H Dasie Best CROME, MD       bisacodyl  (DULCOLAX) suppository 10 mg  10 mg Rectal Daily PRN Sherrill, Omair Latif, DO       diclofenac  (FLECTOR ) 1.3 % 1 patch  1 patch Transdermal BID Garrick Charleston, MD   1 patch at 04/03/23 0913   donepezil  (ARICEPT ) tablet 10 mg  10 mg Oral QHS Howerter, Justin B, DO   10 mg at 04/02/23 2137   famotidine  (PEPCID ) tablet 20 mg  20 mg Oral  Daily Sheikh, Omair Latif, [place]   20 mg at 04/03/23 9091   fenofibrate  tablet 160 mg  160 mg Oral Daily Howerter, Justin B, DO   160 mg at 04/03/23 0908   fentaNYL  (SUBLIMAZE ) injection 25 mcg  25 mcg Intravenous Q2H PRN Howerter, Justin B, DO   25 mcg at 04/02/23 0349   folic acid  (FOLVITE ) tablet 1 mg  1 mg Oral Daily Sheikh, Omair Nephi, DO   1 mg at 04/03/23 9091   guaiFENesin  (MUCINEX ) 12 hr tablet 600 mg  600 mg Oral BID PRN Dasie Leonor CROME, MD       melatonin tablet 3 mg  3 mg Oral QHS PRN Howerter, Justin B, DO       methocarbamol  (ROBAXIN ) tablet 500 mg  500 mg  Oral Q8H PRN Howerter, Justin B, DO   500 mg at 04/03/23 9374   naloxone  (NARCAN ) injection 0.4 mg  0.4 mg Intravenous PRN Howerter, Justin B, DO       ondansetron  (ZOFRAN ) injection 4 mg  4 mg Intravenous Q6H PRN Howerter, Justin B, DO       Oral care mouth rinse  15 mL Mouth Rinse PRN Sheikh, Omair Latif, DO       oxyCODONE  (Oxy IR/ROXICODONE ) immediate release tablet 2.5-5 mg  2.5-5 mg Oral Q4H PRN Dasie Leonor CROME, MD   5 mg at 04/03/23 1045   polyethylene glycol (MIRALAX  / GLYCOLAX ) packet 17 g  17 g Oral BID Sheikh, Omair Newton, DO   17 g at 04/03/23 0910   senna-docusate (Senokot-S) tablet 1 tablet  1 tablet Oral BID Sheikh, Omair Latif, DO   1 tablet at 04/03/23 0908     Discharge Medications: Please see discharge summary for a list of discharge medications.  Relevant Imaging Results:  Relevant Lab Results:   Additional Information SSN: [id number]  Duwaine GORMAN Aran, LCSW

## 2023-04-03 NOTE — Progress Notes (Signed)
 PROGRESS NOTE    Susan Marks  FMW:[id number] DOB: 08-01-1925 DOA: 04/01/2023 PCP: Verena Mems, MD   Brief Narrative:  The patient is a 89 year old Caucasian female with a past medical history significant for but limited to essential hypertension, hyperlipidemia, GERD as well as other comorbidities who presented to [facility] after a ground-level fall and was found to have multiple consecutive right-sided rib fractures.  Trauma surgery was consulted and she is currently getting supportive care and incentive spirometry.  Pain is being controlled.  PT OT recommending SNF and will consult TOC for assistance with discharge disposition.  Of note she is also hyponatremic will need to continue monitor sodium carefully.  Assessment and Plan:  Rib Sided Fractures -Has multiple consecutive rib fractures after mechanical fall that she experienced without loss of consciousness -Involves ribs 9 through 12 with the ninth rib to the limits with right-sided rib fractures noted to be mildly displaced and segmental and suggestive of an element of flail chest segment -CT scan of the chest was done and showed potential hypoventilatory changes versus pulmonary contusion -Trauma surgery was consulted and recommending aggressive pulmonary toilet, incentive spirometry and flutter valve -Continuing with multimodal pain control with scheduled acetaminophen , as needed Robaxin  and low-dose oxycodone  but will consider going to 5 to 10 mg every 4 as needed for severe pain if not improving.  She has not been taking very much oxycodone . -She also has IV fentanyl  25 mcg every 2 as needed for severe pain as well as diclofenac  patch transdermally -We have ordered a lidocaine  patch to be applied to the ribs -PT OT recommending SNF -Continue monitor respiratory status carefully and continue mobilization and will need to try and get her pain better under control  Fall -Had a ground-level fall while ambulating to bathroom  at home without loss of consciousness or head trauma -Is not any blood thinners as an outpatient has had multiple consecutive right-sided rib fractures and potentially a pulmonary contusion -Imaging done and head CT without contrast showed no evidence of acute intracranial process and CT cervical spine showed no evidence of acute cervical spine fracture or subluxation injury -UA is negative -PT OT OT recommending SNF and patient is agreeable  Hypo-osmolar Hyponatremia -Euvolemic and Na+ slightly dropping  -Slight element of hypovolemia but could be also in the setting of SIADH -Not giving any more IV fluid hydration and getting a random urine sodium level and random urine osmolality -Strict I's and O's and daily weights -TSH was 2.700 -Na+ Trend: Recent Labs  Lab 04/01/23 2025 04/02/23 0408 04/03/23 0358  NA 132* 131* 128*  -Continue to Monitor and Trend and repeat CMP  Essential Hypertension -C/w Home Losartan  100 mg po Daily  -Continue to Monitor BP per Protocol -Last BP reading was 158/77  Leg pain -Bilateral leg pain was noted in lower extremity duplex done and showed no evidence of DVT  Hyperlipidemia -C/w Fenofibrate  160 mg po Daily   AKI on CKD Stage 3b Metabolic Acidosis -BUN/Cr Trend: Recent Labs  Lab 04/01/23 2025 04/02/23 0408 04/03/23 0358  BUN 36* 34* 35*  CREATININE 1.25* 1.26* 1.45*  -Avoid Nephrotoxic Medications, Contrast Dyes, Hypotension and Dehydration to Ensure Adequate Renal Perfusion and will need to Renally Adjust Meds -Patient has a slight metabolic acidosis with a CO2 of 21, anion gap of 8, chloride level of 99 -Continue to Monitor and Trend Renal Function carefully and repeat CMP in the AM   Macrocytic Anemia/Normocytic Anemia -Hemoglobin ranges around from 10-11 -Hgb/Hct  Trend: Recent Labs  Lab 04/01/23 1951 04/02/23 0408 04/03/23 0358  HGB 11.3* 10.4* 10.0*  HCT 36.8 32.3* 30.5*  MCV 106.7* 99.7 97.8  -Check Anemia Panel in the  AM,: Folate Level was 4.4 and B12 Level was 601 -Independent folic acid  level is low so we will start supplementation -Continue to Monitor for S/Sx of Bleeding; No overt bleeding noted -Repeat CBC in the AM   Constipation -C/w Bowel Regimen  GERD/GI GI Prophylaxis -C/w Home Famotidine  20 mg po Daily   Hypoalbuminemia -Patient's Albumin Trend: Recent Labs  Lab 04/02/23 0408 04/03/23 0358  ALBUMIN 3.5 3.1*  -Continue to Monitor and Trend and repeat CMP in the AM   DVT prophylaxis: SCDs Start: 04/01/23 2238    Code Status: Full Code Family Communication: No family currently at bedside  Disposition Plan:  Level of care: Progressive Status is: Inpatient Remains inpatient appropriate because: Needs further pain control and her rib fractures PT OT recommending SNF   Consultants:  Trauma surgery  Procedures:  As delineated above  Antimicrobials:  Anti-infectives (From admission, onward)    None       Subjective: Seen and examined at bedside she is saying that she is still having some right-sided pain.  No nausea or vomiting.  Feels okay but still hurting quite a bit.  No lightheadedness or dizziness.  No other concerns or complaints this time.  Objective: Vitals:   04/02/23 2013 04/03/23 0500 04/03/23 0549 04/03/23 2018  BP: (!) 148/77  (!) 147/67 (!) 158/77  Pulse: 63  61 66  Resp: 18  18 18   Temp: 97.9 F (36.6 C)  97.8 F (36.6 C) 98.1 F (36.7 C)  TempSrc: Oral   Oral  SpO2: 94%  95% 95%  Weight:  53.5 kg    Height:        Intake/Output Summary (Last 24 hours) at 04/03/2023 2100 Last data filed at 04/03/2023 0151 Gross per 24 hour  Intake 360 ml  Output --  Net 360 ml   Filed Weights   04/02/23 0354 04/03/23 0500  Weight: 53.4 kg 53.5 kg   Examination: Physical Exam:  Constitutional: Elderly Caucasian female in NAD Respiratory: Diminished to auscultation bilaterally, no wheezing, rales, rhonchi or crackles. Normal respiratory effort and patient  is not tachypenic. No accessory muscle use. Unlabored breathing  Cardiovascular: RRR, no murmurs / rubs / gallops. S1 and S2 auscultated. No extremity edema.   Abdomen: Soft, non-tender, distended 2/2 body habitus. Bowel sounds positive.  GU: Deferred. Musculoskeletal: No clubbing / cyanosis of digits/nails. No joint deformity upper and lower extremities Skin: No rashes, lesions, ulcers on a limited skin evaluation. No induration; Warm and dry.  Neurologic: CN 2-12 grossly intact with no focal deficits. Romberg sign and cerebellar reflexes not assessed.  Psychiatric: Normal judgment and insight. Alert and oriented x 3. Normal mood and appropriate affect.   Data Reviewed: I have personally reviewed following labs and imaging studies  CBC: Recent Labs  Lab 04/01/23 1951 04/02/23 0408 04/03/23 0358  WBC 6.2 8.4 5.4  NEUTROABS 4.3 6.6 3.7  HGB 11.3* 10.4* 10.0*  HCT 36.8 32.3* 30.5*  MCV 106.7* 99.7 97.8  PLT 228 303 284   Basic Metabolic Panel: Recent Labs  Lab 04/01/23 2025 04/02/23 0408 04/02/23 0409 04/03/23 0358  NA 132* 131*  --  128*  K 4.5 4.4  --  4.2  CL 102 101  --  99  CO2 20* 22  --  21*  GLUCOSE 92 98  --  103*  BUN 36* 34*  --  35*  CREATININE 1.25* 1.26*  --  1.45*  CALCIUM 9.5 9.4  --  9.2  MG  --  2.0 2.1 2.0  PHOS  --  3.6  --  4.6   GFR: Estimated Creatinine Clearance: 17.3 mL/min (A) (by C-G formula based on SCr of 1.45 mg/dL (H)). Liver Function Tests: Recent Labs  Lab 04/02/23 0408 04/03/23 0358  AST 27 26  ALT 19 16  ALKPHOS 28* 26*  BILITOT 0.6 0.7  PROT 6.1* 5.9*  ALBUMIN 3.5 3.1*   No results for input(s): LIPASE, AMYLASE in the last 168 hours. No results for input(s): AMMONIA in the last 168 hours. Coagulation Profile: Recent Labs  Lab 04/02/23 0408  INR 1.1   Cardiac Enzymes: No results for input(s): CKTOTAL, CKMB, CKMBINDEX, TROPONINI in the last 168 hours. BNP (last 3 results) No results for input(s): PROBNP  in the last 8760 hours. HbA1C: No results for input(s): HGBA1C in the last 72 hours. CBG: No results for input(s): GLUCAP in the last 168 hours. Lipid Profile: No results for input(s): CHOL, HDL, LDLCALC, TRIG, CHOLHDL, LDLDIRECT in the last 72 hours. Thyroid  Function Tests: Recent Labs    04/02/23 0408  TSH 2.700   Anemia Panel: Recent Labs    04/02/23 0408 04/02/23 0409  VITAMINB12 601  --   FOLATE  --  4.4*   Sepsis Labs: No results for input(s): PROCALCITON, LATICACIDVEN in the last 168 hours.  No results found for this or any previous visit (from the past 240 hours).   Radiology Studies: VAS US  LOWER EXTREMITY VENOUS (DVT) Result Date: 04/02/2023  Lower Venous DVT Study Patient Name:  LAQUETTA RACEY  Date of Exam:   04/02/2023 Medical Rec #: [id number]          Accession #:    [id number] Date of Birth: [birth year]          Patient Gender: F Patient Age:   21 years Exam Location:  [facility] Procedure:      VAS US  LOWER EXTREMITY VENOUS (DVT) Referring Phys: JUSTIN HOWERTER --------------------------------------------------------------------------------  Indications: Pain. Other Indications: Fall on 04/01/2023. Comparison Study: No previous exams Performing Technologist: Jody Hill RVT, RDMS  Examination Guidelines: A complete evaluation includes B-mode imaging, spectral Doppler, color Doppler, and power Doppler as needed of all accessible portions of each vessel. Bilateral testing is considered an integral part of a complete examination. Limited examinations for reoccurring indications may be performed as noted. The reflux portion of the exam is performed with the patient in reverse Trendelenburg.  +---------+---------------+---------+-----------+----------+--------------+ RIGHT    CompressibilityPhasicitySpontaneityPropertiesThrombus Aging +---------+---------------+---------+-----------+----------+--------------+ CFV      Full           Yes       Yes                                 +---------+---------------+---------+-----------+----------+--------------+ SFJ      Full                                                        +---------+---------------+---------+-----------+----------+--------------+ FV Prox  Full           Yes      Yes                                 +---------+---------------+---------+-----------+----------+--------------+  FV Mid   Full           Yes      Yes                                 +---------+---------------+---------+-----------+----------+--------------+ FV DistalFull           Yes      Yes                                 +---------+---------------+---------+-----------+----------+--------------+ PFV      Full                                                        +---------+---------------+---------+-----------+----------+--------------+ POP      Full           Yes      Yes                                 +---------+---------------+---------+-----------+----------+--------------+ PTV      Full                                                        +---------+---------------+---------+-----------+----------+--------------+ PERO     Full                                                        +---------+---------------+---------+-----------+----------+--------------+   +---------+---------------+---------+-----------+----------+--------------+ LEFT     CompressibilityPhasicitySpontaneityPropertiesThrombus Aging +---------+---------------+---------+-----------+----------+--------------+ CFV      Full           Yes      Yes                                 +---------+---------------+---------+-----------+----------+--------------+ SFJ      Full                                                        +---------+---------------+---------+-----------+----------+--------------+ FV Prox  Full           Yes      Yes                                  +---------+---------------+---------+-----------+----------+--------------+ FV Mid   Full           Yes      Yes                                 +---------+---------------+---------+-----------+----------+--------------+ FV DistalFull  Yes      Yes                                 +---------+---------------+---------+-----------+----------+--------------+ PFV      Full                                                        +---------+---------------+---------+-----------+----------+--------------+ POP      Full           Yes      Yes                                 +---------+---------------+---------+-----------+----------+--------------+ PTV      Full                                                        +---------+---------------+---------+-----------+----------+--------------+ PERO     Full                                                        +---------+---------------+---------+-----------+----------+--------------+     Summary: BILATERAL: - No evidence of deep vein thrombosis seen in the lower extremities, bilaterally. - RIGHT: - A cystic structure is found in the popliteal fossa (2.3 x 1.0 x 2.1 cm)..  LEFT: - No cystic structure found in the popliteal fossa.  *See table(s) above for measurements and observations. Electronically signed by Lonni Gaskins MD on 04/02/2023 at 3:03:49 PM.    Final    CT Chest Wo Contrast Result Date: 04/01/2023 CLINICAL DATA:  Slip and fall.  Right rib fracture. EXAM: CT CHEST WITHOUT CONTRAST TECHNIQUE: Multidetector CT imaging of the chest was performed following the standard protocol without IV contrast. RADIATION DOSE REDUCTION: This exam was performed according to the departmental dose-optimization program which includes automated exposure control, adjustment of the mA and/or kV according to patient size and/or use of iterative reconstruction technique. COMPARISON:  Rib radiographs earlier today FINDINGS:  Cardiovascular: The heart is upper normal in size. There are coronary artery calcifications. No pericardial effusion. Moderate atherosclerosis of the thoracic aorta. No periaortic stranding. Aortic tortuosity but no aneurysm. Mediastinum/Nodes: No mediastinal hematoma or hemorrhage. Coarsely calcified mediastinal and right hilar lymph nodes. No noncalcified adenopathy. Small hiatal hernia. Lungs/Pleura: No pneumothorax. Right pleural thickening in the dependent lower lobe adjacent to rib fractures. There is also minimal pleural thickening in the posterior left lower lobe. Patchy areas of ground-glass in the dependent lower lobes may represent hypoventilatory change or contusion. There are multiple benign calcified granuloma, needing no further imaging follow-up. Upper Abdomen: Calcified splenic granuloma. No free fluid or evidence of traumatic injury. Musculoskeletal: Right rib fractures 9 through 12. The ninth through eleventh rib fractures are displaced and segmental. There is adjacent subcutaneous emphysema in the right lateral chest wall. Scoliotic curvature of the thoracic spine without acute thoracic spine fracture. The sternum, included clavicles and shoulder  girdles are intact. IMPRESSION: 1. Right rib fractures 9 through 12. The ninth through eleventh rib fractures are displaced and segmental. This is consistent with a flail chest segment. 2. Right pleural thickening in the dependent lower lobe adjacent to rib fractures. There is also minimal pleural thickening in the posterior left lower lobe. No pneumothorax. 3. Patchy areas of ground-glass in the dependent lower lobes may represent hypoventilatory change or contusion. 4. Coronary artery calcifications. Aortic Atherosclerosis (ICD10-I70.0). 5. Sequela of prior granulomatous disease. Electronically Signed   By: Andrea Gasman M.D.   On: 04/01/2023 21:23   CT Head Wo Contrast Result Date: 04/01/2023 CLINICAL DATA:  Trauma EXAM: CT HEAD WITHOUT CONTRAST  CT CERVICAL SPINE WITHOUT CONTRAST TECHNIQUE: Multidetector CT imaging of the head and cervical spine was performed following the standard protocol without intravenous contrast. Multiplanar CT image reconstructions of the cervical spine were also generated. RADIATION DOSE REDUCTION: This exam was performed according to the departmental dose-optimization program which includes automated exposure control, adjustment of the mA and/or kV according to patient size and/or use of iterative reconstruction technique. COMPARISON:  None Available. FINDINGS: CT HEAD FINDINGS Brain: No evidence of acute infarction, hemorrhage, hydrocephalus, extra-axial collection or mass lesion/mass effect. Subcortical white matter and periventricular small vessel ischemic changes. Cortical volume is age-appropriate. Vascular: No hyperdense vessel or unexpected calcification. Skull: Normal. Negative for fracture or focal lesion. Sinuses/Orbits: The visualized paranasal sinuses are essentially clear. The mastoid air cells are unopacified. Other: None. CT CERVICAL SPINE FINDINGS Alignment: Normal cervical lordosis. Skull base and vertebrae: No acute fracture. No primary bone lesion or focal pathologic process. Soft tissues and spinal canal: No prevertebral fluid or swelling. No visible canal hematoma. Disc levels: Mild degenerative changes of the mid cervical spine. Spinal canal is patent. Upper chest: Evaluated on dedicated CT chest. Other: None. IMPRESSION: No acute intracranial abnormality. Small vessel ischemic changes. No traumatic injury to the cervical spine. Mild degenerative changes. Electronically Signed   By: Pinkie Pebbles M.D.   On: 04/01/2023 21:17   CT Cervical Spine Wo Contrast Result Date: 04/01/2023 CLINICAL DATA:  Trauma EXAM: CT HEAD WITHOUT CONTRAST CT CERVICAL SPINE WITHOUT CONTRAST TECHNIQUE: Multidetector CT imaging of the head and cervical spine was performed following the standard protocol without intravenous  contrast. Multiplanar CT image reconstructions of the cervical spine were also generated. RADIATION DOSE REDUCTION: This exam was performed according to the departmental dose-optimization program which includes automated exposure control, adjustment of the mA and/or kV according to patient size and/or use of iterative reconstruction technique. COMPARISON:  None Available. FINDINGS: CT HEAD FINDINGS Brain: No evidence of acute infarction, hemorrhage, hydrocephalus, extra-axial collection or mass lesion/mass effect. Subcortical white matter and periventricular small vessel ischemic changes. Cortical volume is age-appropriate. Vascular: No hyperdense vessel or unexpected calcification. Skull: Normal. Negative for fracture or focal lesion. Sinuses/Orbits: The visualized paranasal sinuses are essentially clear. The mastoid air cells are unopacified. Other: None. CT CERVICAL SPINE FINDINGS Alignment: Normal cervical lordosis. Skull base and vertebrae: No acute fracture. No primary bone lesion or focal pathologic process. Soft tissues and spinal canal: No prevertebral fluid or swelling. No visible canal hematoma. Disc levels: Mild degenerative changes of the mid cervical spine. Spinal canal is patent. Upper chest: Evaluated on dedicated CT chest. Other: None. IMPRESSION: No acute intracranial abnormality. Small vessel ischemic changes. No traumatic injury to the cervical spine. Mild degenerative changes. Electronically Signed   By: Pinkie Pebbles M.D.   On: 04/01/2023 21:17   Scheduled Meds:  acetaminophen   1,000 mg Oral Q8H   Or   acetaminophen  (TYLENOL ) oral liquid 160 mg/5 mL  1,000 mg Per Tube Q8H   Or   acetaminophen   975 mg Rectal Q8H   diclofenac   1 patch Transdermal BID   donepezil   10 mg Oral QHS   famotidine   20 mg Oral Daily   fenofibrate   160 mg Oral Daily   folic acid   1 mg Oral Daily   lidocaine   1 patch Transdermal Q24H   polyethylene glycol  17 g Oral BID   senna-docusate  1 tablet Oral  BID   Continuous Infusions:   LOS: 2 days   Alejandro Marker, DO [facility] Available via Epic secure chat 7am-7pm After these hours, please refer to coverage provider listed on [email or web address] 04/03/2023, 9:00 PM

## 2023-04-03 NOTE — Progress Notes (Signed)
 Progress Note     Subjective: Pt having uncontrolled pain in right ribs currently. Pain with movement or deep inspiration. Was not taking much oxycodone  because she did not want to be overmedicated. Discussed multimodal pain control and expectations for pain with rib fractures.   Objective: Vital signs in last 24 hours: Temp:  [97.4 F (36.3 C)-97.9 F (36.6 C)] 97.8 F (36.6 C) (01/14 0549) Pulse Rate:  [61-63] 61 (01/14 0549) Resp:  [18] 18 (01/14 0549) BP: (141-148)/(58-77) 147/67 (01/14 0549) SpO2:  [94 %-95 %] 95 % (01/14 0549) Weight:  [53.5 kg] 53.5 kg (01/14 0500) Last BM Date : 04/01/23  Intake/Output from previous day: 01/13 0701 - 01/14 0700 In: 360 [P.O.:360] Out: -  Intake/Output this shift: No intake/output data recorded.  PE: General: pleasant, WD, elderly female who is laying in bed and appears uncomfortable Heart: regular, rate, and rhythm. Lungs: CTAB, no wheezes, rhonchi, or rales noted.  Respiratory effort nonlabored Abd: soft, NT, ND Psych: A&Ox3 with an appropriate affect.    Lab Results:  Recent Labs    04/02/23 0408 04/03/23 0358  WBC 8.4 5.4  HGB 10.4* 10.0*  HCT 32.3* 30.5*  PLT 303 284   BMET Recent Labs    04/02/23 0408 04/03/23 0358  NA 131* 128*  K 4.4 4.2  CL 101 99  CO2 22 21*  GLUCOSE 98 103*  BUN 34* 35*  CREATININE 1.26* 1.45*  CALCIUM 9.4 9.2   PT/INR Recent Labs    04/02/23 0408  LABPROT 14.7  INR 1.1   CMP     Component Value Date/Time   NA 128 (L) 04/03/2023 0358   NA 134 10/18/2022 1321   K 4.2 04/03/2023 0358   CL 99 04/03/2023 0358   CO2 21 (L) 04/03/2023 0358   GLUCOSE 103 (H) 04/03/2023 0358   BUN 35 (H) 04/03/2023 0358   BUN 33 10/18/2022 1321   CREATININE 1.45 (H) 04/03/2023 0358   CALCIUM 9.2 04/03/2023 0358   PROT 5.9 (L) 04/03/2023 0358   PROT 7.0 10/18/2022 1321   ALBUMIN 3.1 (L) 04/03/2023 0358   ALBUMIN 4.4 10/18/2022 1321   AST 26 04/03/2023 0358   ALT 16 04/03/2023 0358    ALKPHOS 26 (L) 04/03/2023 0358   BILITOT 0.7 04/03/2023 0358   BILITOT 0.4 10/18/2022 1321   GFRNONAA 33 (L) 04/03/2023 0358   GFRAA 57 (L) 04/03/2018 0517   Lipase     Component Value Date/Time   LIPASE 35 03/30/2018 1604       Studies/Results: VAS US  LOWER EXTREMITY VENOUS (DVT) Result Date: 04/02/2023  Lower Venous DVT Study Patient Name:  SHREYA LACASSE  Date of Exam:   04/02/2023 Medical Rec #: [id number]          Accession #:    [id number] Date of Birth: 1925-11-30          Patient Gender: F Patient Age:   35 years Exam Location:  [facility] Procedure:      VAS US  LOWER EXTREMITY VENOUS (DVT) Referring Phys: JUSTIN HOWERTER --------------------------------------------------------------------------------  Indications: Pain. Other Indications: Fall on 04/01/2023. Comparison Study: No previous exams Performing Technologist: Jody Hill RVT, RDMS  Examination Guidelines: A complete evaluation includes B-mode imaging, spectral Doppler, color Doppler, and power Doppler as needed of all accessible portions of each vessel. Bilateral testing is considered an integral part of a complete examination. Limited examinations for reoccurring indications may be performed as noted. The reflux portion of the exam is performed  with the patient in reverse Trendelenburg.  +---------+---------------+---------+-----------+----------+--------------+ RIGHT    CompressibilityPhasicitySpontaneityPropertiesThrombus Aging +---------+---------------+---------+-----------+----------+--------------+ CFV      Full           Yes      Yes                                 +---------+---------------+---------+-----------+----------+--------------+ SFJ      Full                                                        +---------+---------------+---------+-----------+----------+--------------+ FV Prox  Full           Yes      Yes                                  +---------+---------------+---------+-----------+----------+--------------+ FV Mid   Full           Yes      Yes                                 +---------+---------------+---------+-----------+----------+--------------+ FV DistalFull           Yes      Yes                                 +---------+---------------+---------+-----------+----------+--------------+ PFV      Full                                                        +---------+---------------+---------+-----------+----------+--------------+ POP      Full           Yes      Yes                                 +---------+---------------+---------+-----------+----------+--------------+ PTV      Full                                                        +---------+---------------+---------+-----------+----------+--------------+ PERO     Full                                                        +---------+---------------+---------+-----------+----------+--------------+   +---------+---------------+---------+-----------+----------+--------------+ LEFT     CompressibilityPhasicitySpontaneityPropertiesThrombus Aging +---------+---------------+---------+-----------+----------+--------------+ CFV      Full           Yes      Yes                                 +---------+---------------+---------+-----------+----------+--------------+  SFJ      Full                                                        +---------+---------------+---------+-----------+----------+--------------+ FV Prox  Full           Yes      Yes                                 +---------+---------------+---------+-----------+----------+--------------+ FV Mid   Full           Yes      Yes                                 +---------+---------------+---------+-----------+----------+--------------+ FV DistalFull           Yes      Yes                                  +---------+---------------+---------+-----------+----------+--------------+ PFV      Full                                                        +---------+---------------+---------+-----------+----------+--------------+ POP      Full           Yes      Yes                                 +---------+---------------+---------+-----------+----------+--------------+ PTV      Full                                                        +---------+---------------+---------+-----------+----------+--------------+ PERO     Full                                                        +---------+---------------+---------+-----------+----------+--------------+     Summary: BILATERAL: - No evidence of deep vein thrombosis seen in the lower extremities, bilaterally. - RIGHT: - A cystic structure is found in the popliteal fossa (2.3 x 1.0 x 2.1 cm)..  LEFT: - No cystic structure found in the popliteal fossa.  *See table(s) above for measurements and observations. Electronically signed by Lonni Gaskins MD on 04/02/2023 at 3:03:49 PM.    Final    CT Chest Wo Contrast Result Date: 04/01/2023 CLINICAL DATA:  Slip and fall.  Right rib fracture. EXAM: CT CHEST WITHOUT CONTRAST TECHNIQUE: Multidetector CT imaging of the chest was performed following the standard protocol without IV contrast. RADIATION DOSE REDUCTION: This exam was performed according to the departmental dose-optimization program which includes automated exposure control,  adjustment of the mA and/or kV according to patient size and/or use of iterative reconstruction technique. COMPARISON:  Rib radiographs earlier today FINDINGS: Cardiovascular: The heart is upper normal in size. There are coronary artery calcifications. No pericardial effusion. Moderate atherosclerosis of the thoracic aorta. No periaortic stranding. Aortic tortuosity but no aneurysm. Mediastinum/Nodes: No mediastinal hematoma or hemorrhage. Coarsely calcified mediastinal  and right hilar lymph nodes. No noncalcified adenopathy. Small hiatal hernia. Lungs/Pleura: No pneumothorax. Right pleural thickening in the dependent lower lobe adjacent to rib fractures. There is also minimal pleural thickening in the posterior left lower lobe. Patchy areas of ground-glass in the dependent lower lobes may represent hypoventilatory change or contusion. There are multiple benign calcified granuloma, needing no further imaging follow-up. Upper Abdomen: Calcified splenic granuloma. No free fluid or evidence of traumatic injury. Musculoskeletal: Right rib fractures 9 through 12. The ninth through eleventh rib fractures are displaced and segmental. There is adjacent subcutaneous emphysema in the right lateral chest wall. Scoliotic curvature of the thoracic spine without acute thoracic spine fracture. The sternum, included clavicles and shoulder girdles are intact. IMPRESSION: 1. Right rib fractures 9 through 12. The ninth through eleventh rib fractures are displaced and segmental. This is consistent with a flail chest segment. 2. Right pleural thickening in the dependent lower lobe adjacent to rib fractures. There is also minimal pleural thickening in the posterior left lower lobe. No pneumothorax. 3. Patchy areas of ground-glass in the dependent lower lobes may represent hypoventilatory change or contusion. 4. Coronary artery calcifications. Aortic Atherosclerosis (ICD10-I70.0). 5. Sequela of prior granulomatous disease. Electronically Signed   By: Andrea Gasman M.D.   On: 04/01/2023 21:23   CT Head Wo Contrast Result Date: 04/01/2023 CLINICAL DATA:  Trauma EXAM: CT HEAD WITHOUT CONTRAST CT CERVICAL SPINE WITHOUT CONTRAST TECHNIQUE: Multidetector CT imaging of the head and cervical spine was performed following the standard protocol without intravenous contrast. Multiplanar CT image reconstructions of the cervical spine were also generated. RADIATION DOSE REDUCTION: This exam was performed  according to the departmental dose-optimization program which includes automated exposure control, adjustment of the mA and/or kV according to patient size and/or use of iterative reconstruction technique. COMPARISON:  None Available. FINDINGS: CT HEAD FINDINGS Brain: No evidence of acute infarction, hemorrhage, hydrocephalus, extra-axial collection or mass lesion/mass effect. Subcortical white matter and periventricular small vessel ischemic changes. Cortical volume is age-appropriate. Vascular: No hyperdense vessel or unexpected calcification. Skull: Normal. Negative for fracture or focal lesion. Sinuses/Orbits: The visualized paranasal sinuses are essentially clear. The mastoid air cells are unopacified. Other: None. CT CERVICAL SPINE FINDINGS Alignment: Normal cervical lordosis. Skull base and vertebrae: No acute fracture. No primary bone lesion or focal pathologic process. Soft tissues and spinal canal: No prevertebral fluid or swelling. No visible canal hematoma. Disc levels: Mild degenerative changes of the mid cervical spine. Spinal canal is patent. Upper chest: Evaluated on dedicated CT chest. Other: None. IMPRESSION: No acute intracranial abnormality. Small vessel ischemic changes. No traumatic injury to the cervical spine. Mild degenerative changes. Electronically Signed   By: Pinkie Pebbles M.D.   On: 04/01/2023 21:17   CT Cervical Spine Wo Contrast Result Date: 04/01/2023 CLINICAL DATA:  Trauma EXAM: CT HEAD WITHOUT CONTRAST CT CERVICAL SPINE WITHOUT CONTRAST TECHNIQUE: Multidetector CT imaging of the head and cervical spine was performed following the standard protocol without intravenous contrast. Multiplanar CT image reconstructions of the cervical spine were also generated. RADIATION DOSE REDUCTION: This exam was performed according to the departmental dose-optimization program which includes  automated exposure control, adjustment of the mA and/or kV according to patient size and/or use of  iterative reconstruction technique. COMPARISON:  None Available. FINDINGS: CT HEAD FINDINGS Brain: No evidence of acute infarction, hemorrhage, hydrocephalus, extra-axial collection or mass lesion/mass effect. Subcortical white matter and periventricular small vessel ischemic changes. Cortical volume is age-appropriate. Vascular: No hyperdense vessel or unexpected calcification. Skull: Normal. Negative for fracture or focal lesion. Sinuses/Orbits: The visualized paranasal sinuses are essentially clear. The mastoid air cells are unopacified. Other: None. CT CERVICAL SPINE FINDINGS Alignment: Normal cervical lordosis. Skull base and vertebrae: No acute fracture. No primary bone lesion or focal pathologic process. Soft tissues and spinal canal: No prevertebral fluid or swelling. No visible canal hematoma. Disc levels: Mild degenerative changes of the mid cervical spine. Spinal canal is patent. Upper chest: Evaluated on dedicated CT chest. Other: None. IMPRESSION: No acute intracranial abnormality. Small vessel ischemic changes. No traumatic injury to the cervical spine. Mild degenerative changes. Electronically Signed   By: Pinkie Pebbles M.D.   On: 04/01/2023 21:17   DG Ribs Unilateral W/Chest Right Result Date: 04/01/2023 CLINICAL DATA:  Fall, right axillary pain EXAM: RIGHT RIBS AND CHEST - 3+ VIEW COMPARISON:  Chest radiographs dated 05/30/2022 FINDINGS: Lungs are essentially clear.  No pleural effusion or pneumothorax. The heart is normal in size.  Thoracic aortic atherosclerosis. Mildly displaced right lateral 9th rib fracture. IMPRESSION: Mildly displaced right lateral 9th rib fracture. No pneumothorax. Electronically Signed   By: Pinkie Pebbles M.D.   On: 04/01/2023 20:26    Anti-infectives: Anti-infectives (From admission, onward)    None        Assessment/Plan  GLF R 9-12 rib fractures with displacement and segmental fractures of ribs 9-11 - multimodal pain control: continue scheduled  tylenol , increase and schedule PO robaxin , prn oxycodone  - can consider lidocaine  patch over diclofenac  patch if pain still difficult to control  - can also consider increasing oxy scale to 5-10 mg q4h prn if severe pain despite maximal utilization at current dosing - mobilize as tolerate - repeat CXR if any worsening in respiratory status but maintaining oxygenation without need for supplemental O2 currently  - pulmonary toileting and aggressive IS - follow up with PCP for pain control over the coming weeks - expect 8-10 weeks of healing with gradual improvement in pain over that time - no other recommendations from a surgery/trauma standpoint. We will sign off  FEN: reg diet,  VTE: ok to have SQH or LMWH from surgery standpoint ID: no current abx   LOS: 2 days   I reviewed hospitalist notes, last 24 h vitals and pain scores, last 48 h intake and output, last 24 h labs and trends, and last 24 h imaging results.    Burnard JONELLE Louder, [facility] 04/03/2023, 10:50 AM Please see Amion for pager number during day hours 7:00am-4:30pm

## 2023-04-03 NOTE — Plan of Care (Signed)
  Problem: Clinical Measurements: Goal: Respiratory complications will improve Outcome: Progressing Goal: Cardiovascular complication will be avoided Outcome: Progressing   Problem: Activity: Goal: Risk for activity intolerance will decrease Outcome: Progressing   Problem: Coping: Goal: Level of anxiety will decrease Outcome: Progressing   Problem: Elimination: Goal: Will not experience complications related to urinary retention Outcome: Progressing   Problem: Safety: Goal: Ability to remain free from injury will improve Outcome: Progressing

## 2023-04-03 NOTE — TOC Progression Note (Signed)
 Transition of Care [facility]) - Progression Note   Patient Details  Name: CAMBER NINH MRN: [id number] Date of Birth: Apr 05, 1925  [facility] CM/SW Contact  Duwaine GORMAN Aran, LCSW Phone Number: 04/03/2023, 11:19 AM  Clinical Narrative: PT/OT evaluations recommended SNF. CSW met with patient to discuss recommendations. Patient is agreeable to rehab prior to returning home. FL2 done; PASRR received. Initial referral faxed out in hub. CSW called [employer] and spoke with Tammy to start insurance authorization. TOC awaiting bed offers and insurance approval for SNF.    Expected Discharge Plan: Skilled Nursing Facility Barriers to Discharge: Continued Medical Work up, English As A Second Language Teacher, SNF Pending bed offer  Expected Discharge Plan and Services In-house Referral: Clinical Social Work Post Acute Care Choice: Skilled Nursing Facility Living arrangements for the past 2 months: Single Family Home           DME Arranged: N/A DME Agency: NA  Social Determinants of Health (SDOH) Interventions SDOH Screenings   Food Insecurity: No Food Insecurity (04/02/2023)  Housing: Low Risk  (04/02/2023)  Transportation Needs: No Transportation Needs (04/02/2023)  Utilities: Not At Risk (04/02/2023)  Social Connections: Moderately Integrated (04/02/2023)  Tobacco Use: Low Risk  (04/01/2023)   Readmission Risk Interventions     No data to display

## 2023-04-04 DIAGNOSIS — E871 Hypo-osmolality and hyponatremia: Secondary | ICD-10-CM | POA: Diagnosis not present

## 2023-04-04 DIAGNOSIS — I10 Essential (primary) hypertension: Secondary | ICD-10-CM | POA: Diagnosis not present

## 2023-04-04 DIAGNOSIS — S2241XA Multiple fractures of ribs, right side, initial encounter for closed fracture: Secondary | ICD-10-CM | POA: Diagnosis not present

## 2023-04-04 LAB — COMPREHENSIVE METABOLIC PANEL
ALT: 17 U/L (ref 0–44)
AST: 24 U/L (ref 15–41)
Albumin: 3.6 g/dL (ref 3.5–5.0)
Alkaline Phosphatase: 29 U/L — ABNORMAL LOW (ref 38–126)
Anion gap: 10 (ref 5–15)
BUN: 34 mg/dL — ABNORMAL HIGH (ref 8–23)
CO2: 19 mmol/L — ABNORMAL LOW (ref 22–32)
Calcium: 9.6 mg/dL (ref 8.9–10.3)
Chloride: 97 mmol/L — ABNORMAL LOW (ref 98–111)
Creatinine, Ser: 1.35 mg/dL — ABNORMAL HIGH (ref 0.44–1.00)
GFR, Estimated: 36 mL/min — ABNORMAL LOW (ref >60)
Glucose, Bld: 101 mg/dL — ABNORMAL HIGH (ref 70–99)
Potassium: 4.5 mmol/L (ref 3.5–5.1)
Sodium: 126 mmol/L — ABNORMAL LOW (ref 135–145)
Total Bilirubin: 0.8 mg/dL (ref 0.0–1.2)
Total Protein: 6.3 g/dL — ABNORMAL LOW (ref 6.5–8.1)

## 2023-04-04 LAB — RETICULOCYTES
Immature Retic Fract: 10.3 % (ref 2.3–15.9)
RBC.: 3.35 MIL/uL — ABNORMAL LOW (ref 3.87–5.11)
Retic Count, Absolute: 55.6 10*3/uL (ref 19.0–186.0)
Retic Ct Pct: 1.7 % (ref 0.4–3.1)

## 2023-04-04 LAB — CBC WITH DIFFERENTIAL/PLATELET
Abs Immature Granulocytes: 0.03 10*3/uL (ref 0.00–0.07)
Basophils Absolute: 0 10*3/uL (ref 0.0–0.1)
Basophils Relative: 0 %
Eosinophils Absolute: 0.3 10*3/uL (ref 0.0–0.5)
Eosinophils Relative: 3 %
HCT: 33.1 % — ABNORMAL LOW (ref 36.0–46.0)
Hemoglobin: 10.9 g/dL — ABNORMAL LOW (ref 12.0–15.0)
Immature Granulocytes: 0 %
Lymphocytes Relative: 12 %
Lymphs Abs: 0.9 10*3/uL (ref 0.7–4.0)
MCH: 32 pg (ref 26.0–34.0)
MCHC: 32.9 g/dL (ref 30.0–36.0)
MCV: 97.1 fL (ref 80.0–100.0)
Monocytes Absolute: 0.7 10*3/uL (ref 0.1–1.0)
Monocytes Relative: 9 %
Neutro Abs: 5.7 10*3/uL (ref 1.7–7.7)
Neutrophils Relative %: 76 %
Platelets: 351 10*3/uL (ref 150–400)
RBC: 3.41 MIL/uL — ABNORMAL LOW (ref 3.87–5.11)
RDW: 13.3 % (ref 11.5–15.5)
WBC: 7.6 10*3/uL (ref 4.0–10.5)
nRBC: 0 % (ref 0.0–0.2)

## 2023-04-04 LAB — VITAMIN B12: Vitamin B-12: 849 pg/mL (ref 180–914)

## 2023-04-04 LAB — PHOSPHORUS: Phosphorus: 4.2 mg/dL (ref 2.5–4.6)

## 2023-04-04 LAB — FERRITIN: Ferritin: 258 ng/mL (ref 11–307)

## 2023-04-04 LAB — FOLATE: Folate: 10.1 ng/mL (ref >5.9)

## 2023-04-04 LAB — IRON AND TIBC
Iron: 46 ug/dL (ref 28–170)
Saturation Ratios: 12 % (ref 10.4–31.8)
TIBC: 372 ug/dL (ref 250–450)
UIBC: 326 ug/dL

## 2023-04-04 LAB — MAGNESIUM: Magnesium: 2 mg/dL (ref 1.7–2.4)

## 2023-04-04 MED ORDER — SODIUM CHLORIDE 1 G PO TABS
2.0000 g | ORAL_TABLET | Freq: Three times a day (TID) | ORAL | Status: AC
  Administered 2023-04-04 (×3): 2 g via ORAL
  Filled 2023-04-04 (×3): qty 2

## 2023-04-04 MED ORDER — SODIUM CHLORIDE 1 G PO TABS
1.0000 g | ORAL_TABLET | Freq: Two times a day (BID) | ORAL | Status: DC
  Administered 2023-04-05: 1 g via ORAL
  Filled 2023-04-04: qty 1

## 2023-04-04 NOTE — Plan of Care (Signed)
   Problem: Activity: Goal: Risk for activity intolerance will decrease Outcome: Progressing   Problem: Coping: Goal: Level of anxiety will decrease Outcome: Progressing   Problem: Elimination: Goal: Will not experience complications related to urinary retention Outcome: Progressing

## 2023-04-04 NOTE — Progress Notes (Signed)
 [facility] PROGRESS NOTE   Susan Marks ION:[id number] DOB: 1925-06-27 DOA: 04/01/2023  PCP: Olin Bertin, MD  Brief History: 89 year old Caucasian female with a past medical history significant for but limited to essential hypertension, hyperlipidemia, GERD as well as other comorbidities who presented to [facility] after a ground-level fall and was found to have multiple consecutive right-sided rib fractures. Trauma surgery was consulted and she is currently getting supportive care and incentive spirometry.   Consultants: Trauma service  Procedures: None    Subjective/Interval History: Patient complains of pain in the right rib cage area.  Denies any nausea vomiting.  Has had some problem having bowel movement.  Had some success this morning but still feels constipated.    Assessment/Plan:  Right-sided rib fracture This is as a result of mechanical fall. Seen by trauma surgery.  Conservative management was recommended.  Continue with incentive spirometry flutter valve.  Pain control.  Lidocaine  patch.  Mobilize.  Hyponatremia Urine osmolality noted to be higher than serum osmolality raising concern for SIADH. Sodium level noted to be lower today.  Will place her on fluid restriction.  Start salt tablets. TSH noted to be 2.7.  Essential hypertension Noted to be on losartan  prior to admission which is currently on hold. Occasional high blood pressure readings noted.  Could be secondary to pain issues.  Will not be too aggressive in this elderly patient.  Leg pain Secondary to fall.  No DVT noted.  Pain control.  Hyperlipidemia Continue with fenofibrate   Acute kidney injury on chronic kidney disease stage IIIb/metabolic acidosis Baseline creatinine is around 1.3.  Seems to be close to baseline currently.  Normocytic anemia Stable hemoglobin noted. Anemia panel shows ferritin of 258, iron 46, TIBC 372, percent saturation 12.  Folic acid  10.1.  B12  849.  Constipation Continue with bowel regimen.  TSH was normal.  History of GERD Continue with Pepcid   DVT Prophylaxis: SCDs Code Status: Full code Family Communication: No family at bedside Disposition Plan: SNF when medically stable     Medications: Scheduled:  acetaminophen   1,000 mg Oral Q8H   Or   acetaminophen  (TYLENOL ) oral liquid 160 mg/5 mL  1,000 mg Per Tube Q8H   Or   acetaminophen   975 mg Rectal Q8H   diclofenac   1 patch Transdermal BID   donepezil   10 mg Oral QHS   famotidine   20 mg Oral Daily   fenofibrate   160 mg Oral Daily   folic acid   1 mg Oral Daily   lidocaine   1 patch Transdermal Q24H   polyethylene glycol  17 g Oral BID   senna-docusate  1 tablet Oral BID   [START ON 04/05/2023] sodium chloride   1 g Oral BID WC   sodium chloride   2 g Oral TID WC   Continuous: PRN:bisacodyl , fentaNYL  (SUBLIMAZE ) injection, guaiFENesin , melatonin, methocarbamol , naLOXone  (NARCAN )  injection, ondansetron  (ZOFRAN ) IV, mouth rinse, oxyCODONE   Antibiotics: Anti-infectives (From admission, onward)    None       Objective:  Vital Signs  Vitals:   04/03/23 0549 04/03/23 2018 04/04/23 0500 04/04/23 0628  BP: (!) 147/67 (!) 158/77  (!) 152/74  Pulse: 61 66  72  Resp: 18 18  19   Temp: 97.8 F (36.6 C) 98.1 F (36.7 C)  98.2 F (36.8 C)  TempSrc:  Oral  Oral  SpO2: 95% 95%  95%  Weight:   53.6 kg   Height:        Intake/Output Summary (Last 24 hours) at  04/04/2023 1048 Last data filed at 04/04/2023 0900 Gross per 24 hour  Intake 480 ml  Output --  Net 480 ml   Filed Weights   04/02/23 0354 04/03/23 0500 04/04/23 0500  Weight: 53.4 kg 53.5 kg 53.6 kg    General appearance: Awake alert.  In no distress Resp: Clear to auscultation bilaterally.  Normal effort Cardio: S1-S2 is normal regular.  No S3-S4.  No rubs murmurs or bruit GI: Abdomen is soft.  Nontender nondistended.  Bowel sounds are present normal.  No masses organomegaly  Lab Results:  Data  Reviewed: I have personally reviewed following labs and reports of the imaging studies  CBC: Recent Labs  Lab 04/01/23 1951 04/02/23 0408 04/03/23 0358 04/04/23 0400  WBC 6.2 8.4 5.4 7.6  NEUTROABS 4.3 6.6 3.7 5.7  HGB 11.3* 10.4* 10.0* 10.9*  HCT 36.8 32.3* 30.5* 33.1*  MCV 106.7* 99.7 97.8 97.1  PLT 228 303 284 351    Basic Metabolic Panel: Recent Labs  Lab 04/01/23 2025 04/02/23 0408 04/02/23 0409 04/03/23 0358 04/04/23 0400  NA 132* 131*  --  128* 126*  K 4.5 4.4  --  4.2 4.5  CL 102 101  --  99 97*  CO2 20* 22  --  21* 19*  GLUCOSE 92 98  --  103* 101*  BUN 36* 34*  --  35* 34*  CREATININE 1.25* 1.26*  --  1.45* 1.35*  CALCIUM 9.5 9.4  --  9.2 9.6  MG  --  2.0 2.1 2.0 2.0  PHOS  --  3.6  --  4.6 4.2    GFR: Estimated Creatinine Clearance: 18.7 mL/min (A) (by C-G formula based on SCr of 1.35 mg/dL (H)).  Liver Function Tests: Recent Labs  Lab 04/02/23 0408 04/03/23 0358 04/04/23 0400  AST 27 26 24   ALT 19 16 17   ALKPHOS 28* 26* 29*  BILITOT 0.6 0.7 0.8  PROT 6.1* 5.9* 6.3*  ALBUMIN 3.5 3.1* 3.6   Coagulation Profile: Recent Labs  Lab 04/02/23 0408  INR 1.1    Thyroid  Function Tests: Recent Labs    04/02/23 0408  TSH 2.700    Anemia Panel: Recent Labs    04/02/23 0408 04/02/23 0409 04/04/23 0400  VITAMINB12 601  --  849  FOLATE  --  4.4* 10.1  FERRITIN  --   --  258  TIBC  --   --  372  IRON  --   --  46  RETICCTPCT  --   --  1.7    Radiology Studies: VAS US  LOWER EXTREMITY VENOUS (DVT) Result Date: 04/02/2023  Lower Venous DVT Study Patient Name:  Susan Marks  Date of Exam:   04/02/2023 Medical Rec #: [id number]          Accession #:    [id number] Date of Birth: February 16, 1926          Patient Gender: F Patient Age:   62 years Exam Location:  [facility] Procedure:      VAS US  LOWER EXTREMITY VENOUS (DVT) Referring Phys: JUSTIN HOWERTER --------------------------------------------------------------------------------   Indications: Pain. Other Indications: Fall on 04/01/2023. Comparison Study: No previous exams Performing Technologist: Jody Hill RVT, RDMS  Examination Guidelines: A complete evaluation includes B-mode imaging, spectral Doppler, color Doppler, and power Doppler as needed of all accessible portions of each vessel. Bilateral testing is considered an integral part of a complete examination. Limited examinations for reoccurring indications may be performed as noted. The reflux portion of the  exam is performed with the patient in reverse Trendelenburg.  +---------+---------------+---------+-----------+----------+--------------+ RIGHT    CompressibilityPhasicitySpontaneityPropertiesThrombus Aging +---------+---------------+---------+-----------+----------+--------------+ CFV      Full           Yes      Yes                                 +---------+---------------+---------+-----------+----------+--------------+ SFJ      Full                                                        +---------+---------------+---------+-----------+----------+--------------+ FV Prox  Full           Yes      Yes                                 +---------+---------------+---------+-----------+----------+--------------+ FV Mid   Full           Yes      Yes                                 +---------+---------------+---------+-----------+----------+--------------+ FV DistalFull           Yes      Yes                                 +---------+---------------+---------+-----------+----------+--------------+ PFV      Full                                                        +---------+---------------+---------+-----------+----------+--------------+ POP      Full           Yes      Yes                                 +---------+---------------+---------+-----------+----------+--------------+ PTV      Full                                                         +---------+---------------+---------+-----------+----------+--------------+ PERO     Full                                                        +---------+---------------+---------+-----------+----------+--------------+   +---------+---------------+---------+-----------+----------+--------------+ LEFT     CompressibilityPhasicitySpontaneityPropertiesThrombus Aging +---------+---------------+---------+-----------+----------+--------------+ CFV      Full           Yes      Yes                                 +---------+---------------+---------+-----------+----------+--------------+  SFJ      Full                                                        +---------+---------------+---------+-----------+----------+--------------+ FV Prox  Full           Yes      Yes                                 +---------+---------------+---------+-----------+----------+--------------+ FV Mid   Full           Yes      Yes                                 +---------+---------------+---------+-----------+----------+--------------+ FV DistalFull           Yes      Yes                                 +---------+---------------+---------+-----------+----------+--------------+ PFV      Full                                                        +---------+---------------+---------+-----------+----------+--------------+ POP      Full           Yes      Yes                                 +---------+---------------+---------+-----------+----------+--------------+ PTV      Full                                                        +---------+---------------+---------+-----------+----------+--------------+ PERO     Full                                                        +---------+---------------+---------+-----------+----------+--------------+     Summary: BILATERAL: - No evidence of deep vein thrombosis seen in the lower extremities, bilaterally. - RIGHT: - A  cystic structure is found in the popliteal fossa (2.3 x 1.0 x 2.1 cm)..  LEFT: - No cystic structure found in the popliteal fossa.  *See table(s) above for measurements and observations. Electronically signed by Jimmye Moulds MD on 04/02/2023 at 3:03:49 PM.    Final        LOS: 3 days   Maylene Spear  [facility] Pager on [email or web address]  04/04/2023, 10:48 AM

## 2023-04-04 NOTE — Progress Notes (Signed)
 Physical Therapy Treatment Patient Details Name: Susan Marks MRN: [id number] DOB: 1926-03-01 Today's Date: 04/04/2023   History of Present Illness Pt is a 89 y.o. female  who is admitted to [facility] on 04/01/2023 with multiple consecutive right-sided rib fractures after ground-level mechanical fall at home, after presenting from home to [facility] ED complaining of fall. PMH significant for essential hypertension, hyperlipidemia, GERD,    PT Comments  Pt required mod assist for bed mobility. She ambulated 94' with RW, distance limited by sharp R sided rib pain. Increased time/effort for all mobility 2* pain. Pt c/o pain and nausea at end of session, nurse notified. Patient will benefit from continued inpatient follow up therapy, <3 hours/day.      If plan is discharge home, recommend the following: A little help with walking and/or transfers;A little help with bathing/dressing/bathroom;Assist for transportation;Help with stairs or ramp for entrance   Can travel by private vehicle     No  Equipment Recommendations  None recommended by PT    Recommendations for Other Services       Precautions / Restrictions Precautions Precautions: Fall Precaution Comments: r rib fxs Restrictions Weight Bearing Restrictions Per Provider Order: No     Mobility  Bed Mobility Overal bed mobility: Needs Assistance Bed Mobility: Supine to Sit, Sit to Supine     Supine to sit: HOB elevated, Mod assist     General bed mobility comments: HOB up, used bed pad to pivot hips, mod A to raise trunk and pivot hips    Transfers Overall transfer level: Needs assistance Equipment used: Rolling walker (2 wheels) Transfers: Sit to/from Stand Sit to Stand: Min assist, From elevated surface           General transfer comment: min A to power up    Ambulation/Gait Ambulation/Gait assistance: Contact guard assist Gait Distance (Feet): 60 Feet Assistive device: Rolling walker (2 wheels) Gait  Pattern/deviations: Step-to pattern, Step-through pattern, Decreased stride length Gait velocity: decr     General Gait Details: intermittent stops with reports of right side pains, distance limited by pain, no loss of balance   Stairs             Wheelchair Mobility     Tilt Bed    Modified Rankin (Stroke Patients Only)       Balance Overall balance assessment: Needs assistance, History of Falls Sitting-balance support: No upper extremity supported, Feet supported Sitting balance-Leahy Scale: Fair     Standing balance support: Bilateral upper extremity supported, Reliant on assistive device for balance, During functional activity Standing balance-Leahy Scale: Poor                              Cognition Arousal: Alert Behavior During Therapy: WFL for tasks assessed/performed Overall Cognitive Status: Within Functional Limits for tasks assessed                                          Exercises      General Comments        Pertinent Vitals/Pain Pain Assessment Faces Pain Scale: Hurts whole lot Pain Location: intermittent sharp pains R ribs Pain Descriptors / Indicators: Discomfort, Sharp, Grimacing Pain Intervention(s): Limited activity within patient's tolerance, Monitored during session, Premedicated before session    Home Living  Prior Function            PT Goals (current goals can now be found in the care plan section) Acute Rehab PT Goals Patient Stated Goal: go home PT Goal Formulation: With patient Time For Goal Achievement: 04/16/23 Potential to Achieve Goals: Good Progress towards PT goals: Progressing toward goals    Frequency    Min 1X/week      PT Plan      Co-evaluation              AM-PAC PT "6 Clicks" Mobility   Outcome Measure  Help needed turning from your back to your side while in a flat bed without using bedrails?: A Little Help needed moving  from lying on your back to sitting on the side of a flat bed without using bedrails?: A Lot Help needed moving to and from a bed to a chair (including a wheelchair)?: A Little Help needed standing up from a chair using your arms (e.g., wheelchair or bedside chair)?: A Little Help needed to walk in hospital room?: A Little Help needed climbing 3-5 steps with a railing? : A Lot 6 Click Score: 16    End of Session Equipment Utilized During Treatment: Gait belt Activity Tolerance: Patient limited by pain Patient left: in chair;with call bell/phone within reach;with chair alarm set Nurse Communication: Mobility status;Patient requests pain meds (pt c/o nausea & pain) PT Visit Diagnosis: Unsteadiness on feet (R26.81);Difficulty in walking, not elsewhere classified (R26.2);Pain;Repeated falls (R29.6)     Time: 0938-1829 PT Time Calculation (min) (ACUTE ONLY): 26 min  Charges:    $Gait Training: 8-22 mins $Therapeutic Activity: 8-22 mins PT General Charges $$ ACUTE PT VISIT: 1 Visit                     Daymon Evans PT 04/04/2023  Acute Rehabilitation Services  Office [phone]

## 2023-04-04 NOTE — TOC Progression Note (Addendum)
 Transition of Care [facility]) - Progression Note   Patient Details  Name: Susan Marks MRN: [id number] Date of Birth: April 11, 1925  Transition of Care [facility]) CM/SW Contact  Zenon Hilda, LCSW Phone Number: 04/04/2023, 11:23 AM  Clinical Narrative: CSW provided the following bed offers with Medicare star ratings to patient:  [facility] [address] [place], [place] [address] [phone] Overall rating ??  Below average  [facility] [address] [place], [place] [address] [phone] Overall rating ? Much below average  [facility] [address] [place], [place] [address] [phone] Overall rating? Below average  [facility] [address] [place], [place] [address] [phone] Overall rating ?? Below average   [facility] [address] [place], [place] [address] [phone] Overall rating ?? Much below average  [facility] [address] [place], [place] [address] [phone] Overall rating ????? Much above average  [facility] [address] [place], [place] [address] [phone] Overall rating ???? Above average  [facility] [address] [place], [place] [address] [phone] Overall rating ????  Per patient, she will reach out to family to discuss the bed offers prior to making a selection.  Addendum: CSW notified by RN patient's caregiver, Thor Fling, wanted to speak with CSW regarding bed offers. CSW met with [name], who asked about [facility]. CSW explained the facility is full and did not make a bed offer. [name] reported she did not like any of the listed bed offers and is interested in [facility]. CSW reached out to [place] in admissions at [facility] to have referral that was sent yesterday  reviewed.  Expected Discharge Plan: Skilled Nursing Facility Barriers to Discharge: Continued Medical Work up, English as a second language teacher, SNF Pending bed offer  Expected Discharge Plan and Services In-house Referral: Clinical Social Work Post Acute Care Choice: Skilled Nursing Facility Living arrangements for the past 2 months: Single Family Home            DME Arranged: N/A DME Agency: NA  Social Determinants of Health (SDOH) Interventions SDOH Screenings   Food Insecurity: No Food Insecurity (04/02/2023)  Housing: Low Risk  (04/02/2023)  Transportation Needs: No Transportation Needs (04/02/2023)  Utilities: Not At Risk (04/02/2023)  Social Connections: Moderately Integrated (04/02/2023)  Tobacco Use: Low Risk  (04/01/2023)   Readmission Risk Interventions     No data to display

## 2023-04-05 DIAGNOSIS — S2241XA Multiple fractures of ribs, right side, initial encounter for closed fracture: Secondary | ICD-10-CM | POA: Diagnosis not present

## 2023-04-05 LAB — CBC
HCT: 29.8 % — ABNORMAL LOW (ref 36.0–46.0)
Hemoglobin: 9.9 g/dL — ABNORMAL LOW (ref 12.0–15.0)
MCH: 32.1 pg (ref 26.0–34.0)
MCHC: 33.2 g/dL (ref 30.0–36.0)
MCV: 96.8 fL (ref 80.0–100.0)
Platelets: 326 10*3/uL (ref 150–400)
RBC: 3.08 MIL/uL — ABNORMAL LOW (ref 3.87–5.11)
RDW: 13.5 % (ref 11.5–15.5)
WBC: 5.8 10*3/uL (ref 4.0–10.5)
nRBC: 0 % (ref 0.0–0.2)

## 2023-04-05 LAB — BASIC METABOLIC PANEL
Anion gap: 9 (ref 5–15)
BUN: 30 mg/dL — ABNORMAL HIGH (ref 8–23)
CO2: 20 mmol/L — ABNORMAL LOW (ref 22–32)
Calcium: 9.2 mg/dL (ref 8.9–10.3)
Chloride: 99 mmol/L (ref 98–111)
Creatinine, Ser: 1.31 mg/dL — ABNORMAL HIGH (ref 0.44–1.00)
GFR, Estimated: 38 mL/min — ABNORMAL LOW (ref >60)
Glucose, Bld: 92 mg/dL (ref 70–99)
Potassium: 4.4 mmol/L (ref 3.5–5.1)
Sodium: 128 mmol/L — ABNORMAL LOW (ref 135–145)

## 2023-04-05 MED ORDER — OXYCODONE HCL 5 MG PO TABS: 2.5000 mg | ORAL_TABLET | ORAL | 0 refills | Status: AC | PRN

## 2023-04-05 MED ORDER — POLYETHYLENE GLYCOL 3350 17 G PO PACK: 17.0000 g | PACK | Freq: Two times a day (BID) | ORAL | Status: AC

## 2023-04-05 MED ORDER — ACETAMINOPHEN 500 MG PO TABS: ORAL_TABLET | ORAL | Status: AC

## 2023-04-05 MED ORDER — SENNOSIDES-DOCUSATE SODIUM 8.6-50 MG PO TABS: 1.0000 | ORAL_TABLET | Freq: Two times a day (BID) | ORAL | Status: AC

## 2023-04-05 MED ORDER — METHOCARBAMOL 500 MG PO TABS: 500.0000 mg | ORAL_TABLET | Freq: Three times a day (TID) | ORAL | Status: AC | PRN

## 2023-04-05 MED ORDER — SODIUM CHLORIDE 1 G PO TABS: 2.0000 g | ORAL_TABLET | Freq: Two times a day (BID) | ORAL | Status: AC

## 2023-04-05 MED ORDER — LIDOCAINE 5 % EX PTCH: 1.0000 | MEDICATED_PATCH | CUTANEOUS | Status: AC

## 2023-04-05 MED ORDER — FOLIC ACID 1 MG PO TABS: 1.0000 mg | ORAL_TABLET | Freq: Every day | ORAL | Status: AC

## 2023-04-05 MED ORDER — MELATONIN 3 MG PO TABS: 3.0000 mg | ORAL_TABLET | Freq: Every evening | ORAL | Status: AC | PRN

## 2023-04-05 MED ORDER — AMLODIPINE BESYLATE 2.5 MG PO TABS: 2.5000 mg | ORAL_TABLET | Freq: Every day | ORAL | Status: AC

## 2023-04-05 NOTE — Discharge Summary (Signed)
[facility]  Physician Discharge Summary   Patient ID: Susan Marks MRN: [id number] DOB/AGE: 1925/11/02 89 y.o.  Admit date: 04/01/2023 Discharge date:   04/05/2023   PCP: Mila Palmer, MD  DISCHARGE DIAGNOSES:    Multiple rib fractures   GERD (gastroesophageal reflux disease)   Essential hypertension   Acute hyponatremia   HLD (hyperlipidemia)   Anemia of chronic disease   RECOMMENDATIONS FOR OUTPATIENT FOLLOW UP: Please check basic metabolic panel in 3 to 4 days to check sodium level   Home Health: Going to SNF Equipment/Devices: None  CODE STATUS: Full code  DISCHARGE CONDITION: fair  Diet recommendation: Regular as tolerated with 1500 mL/day fluid restriction  INITIAL HISTORY: 89 year old Caucasian female with a past medical history significant for but limited to essential hypertension, hyperlipidemia, GERD as well as other comorbidities who presented to [facility] after a ground-level fall and was found to have multiple consecutive right-sided rib fractures. Trauma surgery was consulted and she is currently getting supportive care and incentive spirometry.    Consultants: Trauma service   Procedures: None  HOSPITAL COURSE:   Right-sided rib fracture This is as a result of mechanical fall. Seen by trauma surgery.  Conservative management was recommended.  Continue with incentive spirometry flutter valve.  Pain control.  Lidocaine patch.     Hyponatremia Urine osmolality noted to be higher than serum osmolality raising concern for SIADH. Started on salt tablets with improvement in sodium levels today.  Continue with salt tablets.  Recheck levels in 3 to 4 days.  Also continue with 1500 mL fluid restriction. TSH noted to be 2.7.   Essential hypertension Noted to be on losartan prior to admission which is currently on hold due to elevated creatinine.  Will be started on low-dose amlodipine.   Leg pain Secondary to fall.  No DVT noted.  Pain  control.   Hyperlipidemia Continue with fenofibrate   Acute kidney injury on chronic kidney disease stage IIIb/metabolic acidosis Baseline creatinine is around 1.3.  Seems to be close to baseline currently.   Normocytic anemia Stable hemoglobin noted. Anemia panel shows ferritin of 258, iron 46, TIBC 372, percent saturation 12.  Folic acid 10.1.  B12 849.   Constipation Continue with bowel regimen.  TSH was normal.   History of GERD Continue with Pepcid  Patient is stable.  Okay for discharge to SNF today.   PERTINENT LABS:  The results of significant diagnostics from this hospitalization (including imaging, microbiology, ancillary and laboratory) are listed below for reference.    Labs:   Basic Metabolic Panel: Recent Labs  Lab 04/01/23 2025 04/02/23 0408 04/02/23 0409 04/03/23 0358 04/04/23 0400 04/05/23 0354  NA 132* 131*  --  128* 126* 128*  K 4.5 4.4  --  4.2 4.5 4.4  CL 102 101  --  99 97* 99  CO2 20* 22  --  21* 19* 20*  GLUCOSE 92 98  --  103* 101* 92  BUN 36* 34*  --  35* 34* 30*  CREATININE 1.25* 1.26*  --  1.45* 1.35* 1.31*  CALCIUM 9.5 9.4  --  9.2 9.6 9.2  MG  --  2.0 2.1 2.0 2.0  --   PHOS  --  3.6  --  4.6 4.2  --    Liver Function Tests: Recent Labs  Lab 04/02/23 0408 04/03/23 0358 04/04/23 0400  AST 27 26 24   ALT 19 16 17   ALKPHOS 28* 26* 29*  BILITOT 0.6 0.7 0.8  PROT  6.1* 5.9* 6.3*  ALBUMIN 3.5 3.1* 3.6    CBC: Recent Labs  Lab 04/01/23 1951 04/02/23 0408 04/03/23 0358 04/04/23 0400 04/05/23 0354  WBC 6.2 8.4 5.4 7.6 5.8  NEUTROABS 4.3 6.6 3.7 5.7  --   HGB 11.3* 10.4* 10.0* 10.9* 9.9*  HCT 36.8 32.3* 30.5* 33.1* 29.8*  MCV 106.7* 99.7 97.8 97.1 96.8  PLT 228 303 284 351 326     IMAGING STUDIES VAS [place] LOWER EXTREMITY VENOUS (DVT) Result Date: 04/02/2023  Lower Venous DVT Study Patient Name:  DEBROH SALVANT  Date of Exam:   04/02/2023 Medical Rec #: [id number]          Accession #:    [id number] Date of Birth:  November 11, 1925          Patient Gender: F Patient Age:   48 years Exam Location:  [facility] Procedure:      VAS [place] LOWER EXTREMITY VENOUS (DVT) Referring Phys: Newton Pigg --------------------------------------------------------------------------------  Indications: Pain. Other Indications: Fall on 04/01/2023. Comparison Study: No previous exams Performing Technologist: Jody Hill RVT, RDMS  Examination Guidelines: A complete evaluation includes B-mode imaging, spectral Doppler, color Doppler, and power Doppler as needed of all accessible portions of each vessel. Bilateral testing is considered an integral part of a complete examination. Limited examinations for reoccurring indications may be performed as noted. The reflux portion of the exam is performed with the patient in reverse Trendelenburg.  +---------+---------------+---------+-----------+----------+--------------+ RIGHT    CompressibilityPhasicitySpontaneityPropertiesThrombus Aging +---------+---------------+---------+-----------+----------+--------------+ CFV      Full           Yes      Yes                                 +---------+---------------+---------+-----------+----------+--------------+ SFJ      Full                                                        +---------+---------------+---------+-----------+----------+--------------+ FV Prox  Full           Yes      Yes                                 +---------+---------------+---------+-----------+----------+--------------+ FV Mid   Full           Yes      Yes                                 +---------+---------------+---------+-----------+----------+--------------+ FV DistalFull           Yes      Yes                                 +---------+---------------+---------+-----------+----------+--------------+ PFV      Full                                                         +---------+---------------+---------+-----------+----------+--------------+  POP      Full           Yes      Yes                                 +---------+---------------+---------+-----------+----------+--------------+ PTV      Full                                                        +---------+---------------+---------+-----------+----------+--------------+ PERO     Full                                                        +---------+---------------+---------+-----------+----------+--------------+   +---------+---------------+---------+-----------+----------+--------------+ LEFT     CompressibilityPhasicitySpontaneityPropertiesThrombus Aging +---------+---------------+---------+-----------+----------+--------------+ CFV      Full           Yes      Yes                                 +---------+---------------+---------+-----------+----------+--------------+ SFJ      Full                                                        +---------+---------------+---------+-----------+----------+--------------+ FV Prox  Full           Yes      Yes                                 +---------+---------------+---------+-----------+----------+--------------+ FV Mid   Full           Yes      Yes                                 +---------+---------------+---------+-----------+----------+--------------+ FV DistalFull           Yes      Yes                                 +---------+---------------+---------+-----------+----------+--------------+ PFV      Full                                                        +---------+---------------+---------+-----------+----------+--------------+ POP      Full           Yes      Yes                                 +---------+---------------+---------+-----------+----------+--------------+ PTV  Full                                                         +---------+---------------+---------+-----------+----------+--------------+ PERO     Full                                                        +---------+---------------+---------+-----------+----------+--------------+     Summary: BILATERAL: - No evidence of deep vein thrombosis seen in the lower extremities, bilaterally. - RIGHT: - A cystic structure is found in the popliteal fossa (2.3 x 1.0 x 2.1 cm)..  LEFT: - No cystic structure found in the popliteal fossa.  *See table(s) above for measurements and observations. Electronically signed by Sherald Hess MD on 04/02/2023 at 3:03:49 PM.    Final    CT Chest Wo Contrast Result Date: 04/01/2023 CLINICAL DATA:  Slip and fall.  Right rib fracture. EXAM: CT CHEST WITHOUT CONTRAST TECHNIQUE: Multidetector CT imaging of the chest was performed following the standard protocol without IV contrast. RADIATION DOSE REDUCTION: This exam was performed according to the departmental dose-optimization program which includes automated exposure control, adjustment of the mA and/or kV according to patient size and/or use of iterative reconstruction technique. COMPARISON:  Rib radiographs earlier today FINDINGS: Cardiovascular: The heart is upper normal in size. There are coronary artery calcifications. No pericardial effusion. Moderate atherosclerosis of the thoracic aorta. No periaortic stranding. Aortic tortuosity but no aneurysm. Mediastinum/Nodes: No mediastinal hematoma or hemorrhage. Coarsely calcified mediastinal and right hilar lymph nodes. No noncalcified adenopathy. Small hiatal hernia. Lungs/Pleura: No pneumothorax. Right pleural thickening in the dependent lower lobe adjacent to rib fractures. There is also minimal pleural thickening in the posterior left lower lobe. Patchy areas of ground-glass in the dependent lower lobes may represent hypoventilatory change or contusion. There are multiple benign calcified granuloma, needing no further imaging follow-up.  Upper Abdomen: Calcified splenic granuloma. No free fluid or evidence of traumatic injury. Musculoskeletal: Right rib fractures 9 through 12. The ninth through eleventh rib fractures are displaced and segmental. There is adjacent subcutaneous emphysema in the right lateral chest wall. Scoliotic curvature of the thoracic spine without acute thoracic spine fracture. The sternum, included clavicles and shoulder girdles are intact. IMPRESSION: 1. Right rib fractures 9 through 12. The ninth through eleventh rib fractures are displaced and segmental. This is consistent with a flail chest segment. 2. Right pleural thickening in the dependent lower lobe adjacent to rib fractures. There is also minimal pleural thickening in the posterior left lower lobe. No pneumothorax. 3. Patchy areas of ground-glass in the dependent lower lobes may represent hypoventilatory change or contusion. 4. Coronary artery calcifications. Aortic Atherosclerosis (ICD10-I70.0). 5. Sequela of prior granulomatous disease. Electronically Signed   By: Narda Rutherford M.D.   On: 04/01/2023 21:23   CT Head Wo Contrast Result Date: 04/01/2023 CLINICAL DATA:  Trauma EXAM: CT HEAD WITHOUT CONTRAST CT CERVICAL SPINE WITHOUT CONTRAST TECHNIQUE: Multidetector CT imaging of the head and cervical spine was performed following the standard protocol without intravenous contrast. Multiplanar CT image reconstructions of the cervical spine were also generated. RADIATION DOSE REDUCTION: This exam was performed according to the departmental dose-optimization program  which includes automated exposure control, adjustment of the mA and/or kV according to patient size and/or use of iterative reconstruction technique. COMPARISON:  None Available. FINDINGS: CT HEAD FINDINGS Brain: No evidence of acute infarction, hemorrhage, hydrocephalus, extra-axial collection or mass lesion/mass effect. Subcortical white matter and periventricular small vessel ischemic changes. Cortical  volume is age-appropriate. Vascular: No hyperdense vessel or unexpected calcification. Skull: Normal. Negative for fracture or focal lesion. Sinuses/Orbits: The visualized paranasal sinuses are essentially clear. The mastoid air cells are unopacified. Other: None. CT CERVICAL SPINE FINDINGS Alignment: Normal cervical lordosis. Skull base and vertebrae: No acute fracture. No primary bone lesion or focal pathologic process. Soft tissues and spinal canal: No prevertebral fluid or swelling. No visible canal hematoma. Disc levels: Mild degenerative changes of the mid cervical spine. Spinal canal is patent. Upper chest: Evaluated on dedicated CT chest. Other: None. IMPRESSION: No acute intracranial abnormality. Small vessel ischemic changes. No traumatic injury to the cervical spine. Mild degenerative changes. Electronically Signed   By: Charline Bills M.D.   On: 04/01/2023 21:17   CT Cervical Spine Wo Contrast Result Date: 04/01/2023 CLINICAL DATA:  Trauma EXAM: CT HEAD WITHOUT CONTRAST CT CERVICAL SPINE WITHOUT CONTRAST TECHNIQUE: Multidetector CT imaging of the head and cervical spine was performed following the standard protocol without intravenous contrast. Multiplanar CT image reconstructions of the cervical spine were also generated. RADIATION DOSE REDUCTION: This exam was performed according to the departmental dose-optimization program which includes automated exposure control, adjustment of the mA and/or kV according to patient size and/or use of iterative reconstruction technique. COMPARISON:  None Available. FINDINGS: CT HEAD FINDINGS Brain: No evidence of acute infarction, hemorrhage, hydrocephalus, extra-axial collection or mass lesion/mass effect. Subcortical white matter and periventricular small vessel ischemic changes. Cortical volume is age-appropriate. Vascular: No hyperdense vessel or unexpected calcification. Skull: Normal. Negative for fracture or focal lesion. Sinuses/Orbits: The visualized  paranasal sinuses are essentially clear. The mastoid air cells are unopacified. Other: None. CT CERVICAL SPINE FINDINGS Alignment: Normal cervical lordosis. Skull base and vertebrae: No acute fracture. No primary bone lesion or focal pathologic process. Soft tissues and spinal canal: No prevertebral fluid or swelling. No visible canal hematoma. Disc levels: Mild degenerative changes of the mid cervical spine. Spinal canal is patent. Upper chest: Evaluated on dedicated CT chest. Other: None. IMPRESSION: No acute intracranial abnormality. Small vessel ischemic changes. No traumatic injury to the cervical spine. Mild degenerative changes. Electronically Signed   By: Charline Bills M.D.   On: 04/01/2023 21:17   DG Ribs Unilateral W/Chest Right Result Date: 04/01/2023 CLINICAL DATA:  Fall, right axillary pain EXAM: RIGHT RIBS AND CHEST - 3+ VIEW COMPARISON:  Chest radiographs dated 05/30/2022 FINDINGS: Lungs are essentially clear.  No pleural effusion or pneumothorax. The heart is normal in size.  Thoracic aortic atherosclerosis. Mildly displaced right lateral 9th rib fracture. IMPRESSION: Mildly displaced right lateral 9th rib fracture. No pneumothorax. Electronically Signed   By: Charline Bills M.D.   On: 04/01/2023 20:26    DISCHARGE EXAMINATION: Vitals:   04/04/23 1423 04/04/23 2012 04/05/23 0500 04/05/23 0555  BP: (!) 163/79 (!) 140/64  (!) 152/65  Pulse: 71 66  67  Resp: 18 15  16   Temp: 97.6 F (36.4 C) 97.6 F (36.4 C)  98 F (36.7 C)  TempSrc: Oral Oral  Oral  SpO2: 95% 95%  93%  Weight:   55 kg   Height:       General appearance: Awake alert.  In no distress Resp:  Clear to auscultation bilaterally.  Normal effort Cardio: S1-S2 is normal regular.  No S3-S4.  No rubs murmurs or bruit GI: Abdomen is soft.  Nontender nondistended.  Bowel sounds are present normal.  No masses organomegaly  DISPOSITION: SNF  Discharge Instructions     Call MD for:  difficulty breathing, headache or  visual disturbances   Complete by: As directed    Call MD for:  extreme fatigue   Complete by: As directed    Call MD for:  persistant dizziness or light-headedness   Complete by: As directed    Call MD for:  persistant nausea and vomiting   Complete by: As directed    Call MD for:  severe uncontrolled pain   Complete by: As directed    Call MD for:  temperature >100.4   Complete by: As directed    Diet general   Complete by: As directed    Discharge instructions   Complete by: As directed    Please review instructions on the discharge summary  You were cared for by a hospitalist during your hospital stay. If you have any questions about your discharge medications or the care you received while you were in the hospital after you are discharged, you can call the unit and asked to speak with the hospitalist on call if the hospitalist that took care of you is not available. Once you are discharged, your primary care physician will handle any further medical issues. Please note that NO REFILLS for any discharge medications will be authorized once you are discharged, as it is imperative that you return to your primary care physician (or establish a relationship with a primary care physician if you do not have one) for your aftercare needs so that they can reassess your need for medications and monitor your lab values. If you do not have a primary care physician, you can call [phone] for a physician referral.   Increase activity slowly   Complete by: As directed          Allergies as of 04/05/2023       Reactions   Sulfa Antibiotics Other (See Comments)   REACTION: swollen under eyes and cheek   Alendronate Other (See Comments)    stomach upset   Asa [aspirin] Other (See Comments)   "Kidney Problems"   Azithromycin Other (See Comments)   Unknown    Lisinopril Cough   Other Swelling   Preservatives in eye drops   Statins Other (See Comments)   Leg cramps        Medication List      STOP taking these medications    acetaminophen 650 MG CR tablet Commonly known as: TYLENOL Replaced by: acetaminophen 500 MG tablet   denosumab 60 MG/ML Soln injection Commonly known as: PROLIA   lidocaine 4 % Replaced by: lidocaine 5 %   losartan 100 MG tablet Commonly known as: COZAAR   naproxen 375 MG tablet Commonly known as: NAPROSYN       TAKE these medications    acetaminophen 500 MG tablet Commonly known as: TYLENOL Take 2 tablets 3 times a day for 2 days and then 1 tablet every 4 hours as needed for mild pain. Replaces: acetaminophen 650 MG CR tablet   amLODipine 2.5 MG tablet Commonly known as: NORVASC Take 1 tablet (2.5 mg total) by mouth daily.   donepezil 10 MG tablet Commonly known as: ARICEPT Take 1 tablet (10 mg total) by mouth at bedtime.   famotidine 40  MG tablet Commonly known as: PEPCID Take 40 mg by mouth daily.   fenofibrate micronized 200 MG capsule Commonly known as: LOFIBRA Take 200 mg by mouth at bedtime.   folic acid 1 MG tablet Commonly known as: FOLVITE Take 1 tablet (1 mg total) by mouth daily. Start taking on: April 06, 2023   lidocaine 5 % Commonly known as: LIDODERM Place 1 patch onto the skin daily. Remove & Discard patch within 12 hours or as directed by MD. Apply to Ribs on the right Replaces: lidocaine 4 %   melatonin 3 MG Tabs tablet Take 1 tablet (3 mg total) by mouth at bedtime as needed (insomnia).   methocarbamol 500 MG tablet Commonly known as: ROBAXIN Take 1 tablet (500 mg total) by mouth every 8 (eight) hours as needed for muscle spasms.   oxyCODONE 5 MG immediate release tablet Commonly known as: Oxy IR/ROXICODONE Take 0.5-1 tablets (2.5-5 mg total) by mouth every 4 (four) hours as needed (2.5mg  for moderate pain, 5mg  for severe pain).   polyethylene glycol 17 g packet Commonly known as: MIRALAX / GLYCOLAX Take 17 g by mouth 2 (two) times daily. What changed: when to take this   senna-docusate  8.6-50 MG tablet Commonly known as: Senokot-S Take 1 tablet by mouth 2 (two) times daily.   sodium chloride 1 g tablet Take 2 tablets (2 g total) by mouth 2 (two) times daily with a meal.   VITAMIN B-12 PO Take 1 capsule by mouth daily.   VITAMIN D PO Take 1 capsule by mouth daily.          Contact information for follow-up providers     Mila Palmer, MD. Schedule an appointment as soon as possible for a visit.   Specialty: Family Medicine Why: post hospitalization follow up Contact information: [address] [place] [address] [phone]              Contact information for after-discharge care     Destination     [facility], [facility] .   Service: Skilled Nursing Contact information: [address] [place] [address] [phone]                     TOTAL DISCHARGE TIME: 35 minutes  Juanelle Trueheart Rito Ehrlich  [facility] Pager on [email or web address]  04/05/2023, 9:22 AM

## 2023-04-05 NOTE — TOC Transition Note (Signed)
Transition of Care [facility]) - Discharge Note  Patient Details  Name: Susan Marks MRN: [id number] Date of Birth: 1926-01-24  [facility] CM/SW Contact:  Ewing Schlein, LCSW Phone Number: 04/05/2023, 11:23 AM  Clinical Narrative: Patient received bed offer from [facility] and can admit the patient today. CSW confirmed with son, Aarini Ketchmark, who also confirmed with patient's caregiver, Matthias Hughs, that they are accepting the bed offer. CSW called [facility] and spoke with Tammy to provide the facility in order to complete insurance authorization. Patient has been approved for 7 days ([id number]). Patient was not approved for [facility]. Ms. Christinia Gully is agreeable to providing transportation to [place] this afternoon. Patient will go to room 408 and the number for report is [phone]. Discharge packet completed. RN updated. TOC signing off.    Final next level of care: Skilled Nursing Facility Barriers to Discharge: Barriers Resolved  Patient Goals and CMS Choice Patient states their goals for this hospitalization and ongoing recovery are:: Go to rehab CMS Medicare.gov Compare Post Acute Care list provided to:: Patient Choice offered to / list presented to : Patient, Adult Children  Discharge Placement PASRR number recieved: 04/03/23    Patient chooses bed at: [facility] Patient to be transferred to facility by: Matthias Hughs (caregiver) Name of family member notified: Sherrian Divers Patient and family notified of of transfer: 04/05/23  Discharge Plan and Services Additional resources added to the After Visit Summary for   In-house Referral: Clinical Social Work Post Acute Care Choice: Skilled Nursing Facility          DME Arranged: N/A DME Agency: NA  Social Drivers of Health (SDOH) Interventions SDOH Screenings   Food Insecurity: No Food Insecurity (04/02/2023)  Housing: Low Risk  (04/02/2023)  Transportation Needs: No Transportation Needs (04/02/2023)  Utilities:  Not At Risk (04/02/2023)  Social Connections: Moderately Integrated (04/02/2023)  Tobacco Use: Low Risk  (04/01/2023)   Readmission Risk Interventions     No data to display

## 2023-04-05 NOTE — Progress Notes (Signed)
[facility] contacted and report given to  M. Raul Del, LPN

## 2023-04-05 NOTE — Progress Notes (Signed)
Occupational Therapy Treatment Patient Details Name: Susan Marks MRN: [id number] DOB: Jan 21, 1926 Today's Date: 04/05/2023   History of present illness Pt is a 89 y.o. female  who is admitted to [facility] on 04/01/2023 with multiple consecutive right-sided rib fractures after ground-level mechanical fall at home, after presenting from home to [facility] ED complaining of fall. PMH significant for essential hypertension, hyperlipidemia, GERD,   OT comments  Pt endorsing less pain and is eager to discharge soon. Intermittent rib pain while completing functional mobility t/f sink with RW + CGA. Overall, pt making good progress towards goals. Bed mobility minA HHA to sit EOB, transfers to [facility] with CGA, stands at sink for grooming tasks CGA for standing balance and setup of tools. Discharge recommendation remains appropriate. Pt limited by weakness, pain and decreased balance. OT will continue to follow. Patient will benefit from continued inpatient follow up therapy, <3 hours/day       If plan is discharge home, recommend the following:  A little help with walking and/or transfers;A little help with bathing/dressing/bathroom;Assistance with cooking/housework;Assist for transportation;Help with stairs or ramp for entrance   Equipment Recommendations  Tub/shower bench;BSC/3in1       Precautions / Restrictions Precautions Precautions: Fall Precaution Comments: r rib fxs Restrictions Weight Bearing Restrictions Per Provider Order: No       Mobility Bed Mobility Overal bed mobility: Needs Assistance Bed Mobility: Supine to Sit     Supine to sit: Min assist, HOB elevated     General bed mobility comments: increased time, pt reaching for HHA light minA    Transfers Overall transfer level: Needs assistance Equipment used: Rolling walker (2 wheels) Transfers: Sit to/from Stand, Bed to chair/wheelchair/BSC Sit to Stand: Contact guard assist     Step pivot transfers: Contact  guard assist           Balance Overall balance assessment: Needs assistance, History of Falls Sitting-balance support: No upper extremity supported, Feet supported Sitting balance-Leahy Scale: Fair     Standing balance support: Bilateral upper extremity supported, Reliant on assistive device for balance, During functional activity Standing balance-Leahy Scale: Fair Standing balance comment: CGA at sink to wash face, no LOB                           ADL either performed or assessed with clinical judgement   ADL Overall ADL's : Needs assistance/impaired     Grooming: Wash/dry hands;Wash/dry face;Sitting;Standing;Set up Grooming Details (indicate cue type and reason): sink level, CGA for standing balance                 Toilet Transfer: Contact guard assist;BSC/3in1;Squat-pivot Toilet Transfer Details (indicate cue type and reason): SPT from EOB <> BSC, increased time and cues for hand placement Toileting- Clothing Manipulation and Hygiene: Contact guard assist;Sit to/from stand       Functional mobility during ADLs: Contact guard assist;Rolling walker (2 wheels) General ADL Comments: Performing functional transfers EOB>BSC, t/f sink with RW + CGA to stand for grooming tasks.      Cognition Arousal: Alert Behavior During Therapy: WFL for tasks assessed/performed Overall Cognitive Status: Within Functional Limits for tasks assessed  Pertinent Vitals/ Pain       Pain Assessment Pain Assessment: Faces Faces Pain Scale: Hurts whole lot Pain Location: intermittent sharp pains R ribs Pain Descriptors / Indicators: Discomfort, Sharp, Grimacing Pain Intervention(s): Limited activity within patient's tolerance, Monitored during session, Repositioned   Frequency  Min 1X/week        Progress Toward Goals  OT Goals(current goals can now be found in the care plan section)  Progress  towards OT goals: Progressing toward goals  Acute Rehab OT Goals OT Goal Formulation: With patient Time For Goal Achievement: 04/16/23 Potential to Achieve Goals: Good ADL Goals Pt Will Perform Grooming: with contact guard assist;standing Pt Will Perform Lower Body Dressing: sitting/lateral leans;with adaptive equipment;sit to/from stand;with contact guard assist Pt Will Transfer to Toilet: with contact guard assist;bedside commode;ambulating Pt Will Perform Toileting - Clothing Manipulation and hygiene: with contact guard assist;with adaptive equipment;sitting/lateral leans;sit to/from stand  Plan         AM-PAC OT "6 Clicks" Daily Activity     Outcome Measure   Help from another person eating meals?: None Help from another person taking care of personal grooming?: None Help from another person toileting, which includes using toliet, bedpan, or urinal?: A Little Help from another person bathing (including washing, rinsing, drying)?: A Little Help from another person to put on and taking off regular upper body clothing?: A Little Help from another person to put on and taking off regular lower body clothing?: A Little 6 Click Score: 20    End of Session Equipment Utilized During Treatment: Rolling walker (2 wheels)  OT Visit Diagnosis: History of falling (Z91.81);Muscle weakness (generalized) (M62.81);Pain Pain - Right/Left: Right Pain - part of body:  (ribs)   Activity Tolerance Patient tolerated treatment well   Patient Left in chair;with call bell/phone within reach;with chair alarm set   Nurse Communication Mobility status        Time: 4782-9562 OT Time Calculation (min): 20 min  Charges: OT General Charges $OT Visit: 1 Visit OT Treatments $Self Care/Home Management : 8-22 mins Shaylah Mcghie L. Sopheap Boehle, OTR/L  04/05/23, 11:41 AM

## 2023-04-10 ENCOUNTER — Telehealth: Payer: Self-pay | Admitting: Neurology

## 2023-04-10 NOTE — Telephone Encounter (Signed)
Pt's friend, Matthias Hughs reschedule appointment due to Pt had a fall and is currently in rehabilitation

## 2023-04-17 ENCOUNTER — Ambulatory Visit: Payer: PPO | Admitting: Neurology

## 2023-04-27 DIAGNOSIS — N1832 Chronic kidney disease, stage 3b: Secondary | ICD-10-CM | POA: Diagnosis not present

## 2023-04-27 DIAGNOSIS — K219 Gastro-esophageal reflux disease without esophagitis: Secondary | ICD-10-CM | POA: Diagnosis not present

## 2023-04-27 DIAGNOSIS — I447 Left bundle-branch block, unspecified: Secondary | ICD-10-CM | POA: Diagnosis not present

## 2023-04-27 DIAGNOSIS — F5104 Psychophysiologic insomnia: Secondary | ICD-10-CM | POA: Diagnosis not present

## 2023-04-27 DIAGNOSIS — I129 Hypertensive chronic kidney disease with stage 1 through stage 4 chronic kidney disease, or unspecified chronic kidney disease: Secondary | ICD-10-CM | POA: Diagnosis not present

## 2023-04-27 DIAGNOSIS — S225XXD Flail chest, subsequent encounter for fracture with routine healing: Secondary | ICD-10-CM | POA: Diagnosis not present

## 2023-04-27 DIAGNOSIS — E7800 Pure hypercholesterolemia, unspecified: Secondary | ICD-10-CM | POA: Diagnosis not present

## 2023-04-27 DIAGNOSIS — Z9181 History of falling: Secondary | ICD-10-CM | POA: Diagnosis not present

## 2023-04-27 DIAGNOSIS — N179 Acute kidney failure, unspecified: Secondary | ICD-10-CM | POA: Diagnosis not present

## 2023-04-27 DIAGNOSIS — G43909 Migraine, unspecified, not intractable, without status migrainosus: Secondary | ICD-10-CM | POA: Diagnosis not present

## 2023-04-27 DIAGNOSIS — E871 Hypo-osmolality and hyponatremia: Secondary | ICD-10-CM | POA: Diagnosis not present

## 2023-04-27 DIAGNOSIS — D631 Anemia in chronic kidney disease: Secondary | ICD-10-CM | POA: Diagnosis not present

## 2023-04-27 DIAGNOSIS — E872 Acidosis, unspecified: Secondary | ICD-10-CM | POA: Diagnosis not present

## 2023-04-27 DIAGNOSIS — F0390 Unspecified dementia without behavioral disturbance: Secondary | ICD-10-CM | POA: Diagnosis not present

## 2023-05-01 DIAGNOSIS — N1832 Chronic kidney disease, stage 3b: Secondary | ICD-10-CM | POA: Diagnosis not present

## 2023-05-01 DIAGNOSIS — I10 Essential (primary) hypertension: Secondary | ICD-10-CM | POA: Diagnosis not present

## 2023-05-01 DIAGNOSIS — N179 Acute kidney failure, unspecified: Secondary | ICD-10-CM | POA: Diagnosis not present

## 2023-05-01 DIAGNOSIS — D649 Anemia, unspecified: Secondary | ICD-10-CM | POA: Diagnosis not present

## 2023-05-01 DIAGNOSIS — S225XXD Flail chest, subsequent encounter for fracture with routine healing: Secondary | ICD-10-CM | POA: Diagnosis not present

## 2023-06-27 DIAGNOSIS — F0390 Unspecified dementia without behavioral disturbance: Secondary | ICD-10-CM | POA: Diagnosis not present

## 2023-06-27 DIAGNOSIS — E559 Vitamin D deficiency, unspecified: Secondary | ICD-10-CM | POA: Diagnosis not present

## 2023-06-27 DIAGNOSIS — N1831 Chronic kidney disease, stage 3a: Secondary | ICD-10-CM | POA: Diagnosis not present

## 2023-06-27 DIAGNOSIS — R7301 Impaired fasting glucose: Secondary | ICD-10-CM | POA: Diagnosis not present

## 2023-06-27 DIAGNOSIS — D649 Anemia, unspecified: Secondary | ICD-10-CM | POA: Diagnosis not present

## 2023-06-27 DIAGNOSIS — E7800 Pure hypercholesterolemia, unspecified: Secondary | ICD-10-CM | POA: Diagnosis not present

## 2023-06-27 DIAGNOSIS — Z79899 Other long term (current) drug therapy: Secondary | ICD-10-CM | POA: Diagnosis not present

## 2023-06-27 DIAGNOSIS — E538 Deficiency of other specified B group vitamins: Secondary | ICD-10-CM | POA: Diagnosis not present

## 2023-06-27 DIAGNOSIS — L8990 Pressure ulcer of unspecified site, unspecified stage: Secondary | ICD-10-CM | POA: Diagnosis not present

## 2023-07-26 ENCOUNTER — Encounter (HOSPITAL_BASED_OUTPATIENT_CLINIC_OR_DEPARTMENT_OTHER): Payer: Self-pay | Admitting: Physical Therapy

## 2023-07-26 ENCOUNTER — Ambulatory Visit (HOSPITAL_BASED_OUTPATIENT_CLINIC_OR_DEPARTMENT_OTHER): Attending: Family Medicine | Admitting: Physical Therapy

## 2023-07-26 DIAGNOSIS — M6281 Muscle weakness (generalized): Secondary | ICD-10-CM | POA: Insufficient documentation

## 2023-07-26 DIAGNOSIS — R296 Repeated falls: Secondary | ICD-10-CM | POA: Insufficient documentation

## 2023-07-26 DIAGNOSIS — R2689 Other abnormalities of gait and mobility: Secondary | ICD-10-CM | POA: Diagnosis not present

## 2023-07-26 NOTE — Therapy (Signed)
 OUTPATIENT PHYSICAL THERAPY LOWER EXTREMITY EVALUATION   Patient Name: Susan Marks MRN: [id number] DOB:08-05-1925, 89 y.o., female Today's Date: 07/26/2023  END OF SESSION:  PT End of Session - 07/26/23 1258     Visit Number 1    Number of Visits 16    Date for PT Re-Evaluation 09/20/23    Authorization Type [employer]    Progress Note Due on Visit 10    PT Start Time 1306    PT Stop Time 1345    PT Time Calculation (min) 39 min    Activity Tolerance Patient tolerated treatment well    Behavior During Therapy WFL for tasks assessed/performed             Past Medical History:  Diagnosis Date   Chronic headaches    migraines   Chronic kidney disease    High blood pressure    High cholesterol    Past Surgical History:  Procedure Laterality Date   VESICOVAGINAL FISTULA CLOSURE W/ TAH     Patient Active Problem List   Diagnosis Date Noted   Multiple rib fractures 04/01/2023   Fall at home, initial encounter 04/01/2023   Acute hyponatremia 04/01/2023   HLD (hyperlipidemia) 04/01/2023   Anemia of chronic disease 04/01/2023   Closed flail chest 04/01/2023   LBBB (left bundle branch block) 03/30/2018   Diverticulitis of intestine with perforation without abscess 03/30/2018   Chest pain 03/10/2014   Essential hypertension 03/10/2014   Abnormal EKG 03/10/2014   GERD (gastroesophageal reflux disease) 07/07/2013   Granulomatous lung disease (HCC) 07/07/2013   Cough 06/13/2013    PCP: Olin Bertin, MD  REFERRING PROVIDER: Olin Bertin, MD  REFERRING DIAG: R26.89 (ICD-10-CM) - Other abnormalities of gait and mobility R29.6 (ICD-10-CM) - Repeated falls  THERAPY DIAG:  Other abnormalities of gait and mobility  Repeated falls  Muscle weakness (generalized)  Rationale for Evaluation and Treatment: Rehabilitation  ONSET DATE: Jan 2025  SUBJECTIVE:   SUBJECTIVE STATEMENT: Patient states bad fall in Jan. 2025 when in the bathroom. Doesn't  remember the event, getting memory back. Broke several ribs. Patient states that her and her husband are in her own home. 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 Using RW at home for mobility. Has caregiver support 24/7.   PERTINENT HISTORY: HTN, HLD, falls, rib fractures, osteoporosis  PAIN:  Are you having pain? Yes: NPRS scale: 1.5/10 Pain location: R hip Pain description: ache Aggravating factors: sitting for long time and then getting up Relieving factors: ice  PRECAUTIONS: Fall  WEIGHT BEARING RESTRICTIONS: No  FALLS:  Has patient fallen in last 6 months? Yes. Number of falls 2   PLOF: ambulates with RW, caregiver support 24/7  PATIENT GOALS: to get stronger  OBJECTIVE: (objective measures from initial evaluation unless otherwise dated)   COGNITION: Overall cognitive status: Within functional limits for tasks assessed     SENSATION: WFL  POSTURE: rounded shoulders, forward head, increased thoracic kyphosis, and flexed trunk    LOWER EXTREMITY ROM:  Active ROM Right eval Left eval  Hip flexion    Hip extension    Hip abduction    Hip adduction    Hip internal rotation    Hip external rotation    Knee flexion    Knee extension    Ankle dorsiflexion    Ankle plantarflexion    Ankle inversion    Ankle eversion     (Blank rows = not tested) *= pain/symptoms  LOWER EXTREMITY MMT:  MMT Right eval Left eval  Hip flexion 3+* 4  Hip extension    Hip abduction    Hip adduction    Hip internal rotation    Hip external rotation    Knee flexion 4* 5  Knee extension 4-* 5  Ankle dorsiflexion 5 5  Ankle plantarflexion    Ankle inversion    Ankle eversion     (Blank rows = not tested) *= pain/symptoms   FUNCTIONAL TESTS:  5 times sit to stand: 35.69 seconds without UE support; slow, labored, bilateral LE valgus  GAIT: Distance walked: 100 feet Assistive device utilized: Walker - 2 wheeled Level of assistance:  CGA Comments: ambulates with RW with out loss of balance DGI - with RW 1. Gait level surface (2) Mild Impairment: Walks 20', uses assistive devices, slower speed, mild gait deviations. 2. Change in gait speed (2) Mild Impairment: Is able to change speed but demonstrates mild gait deviations, or not gait deviations but unable to achieve a significant change in velocity, or uses an assistive device. 3. Gait with horizontal head turns (2) Mild Impairment: Performs head turns smoothly with slight change in gait velocity, i.e., minor disruption to smooth gait path or uses walking aid. 4. Gait with vertical head turns (2) Mild Impairment: Performs head turns smoothly with slight change in gait velocity, i.e., minor disruption to smooth gait path or uses walking aid. 5. Gait and pivot turn (1) Moderate Impairment: Turns slowly, requires verbal cueing, requires several small steps to catch balance following turn and stop. 6. Step over obstacle (1) Moderate Impairment: Is able to step over box but must stop, then step over. May require verbal cueing. 7. Step around obstacles (1) Moderate Impairment: Is able to clear cones but must significantly slow, speed to accomplish task, or requires verbal cueing. 8. Stairs (2) Mild Impairment: Alternating feet, must use rail.  TOTAL SCORE: 13 / 24   TODAY'S TREATMENT:                                                                                                                              DATE:  07/26/23 Eval and education    PATIENT EDUCATION:  Education details: Patient educated on exam findings, POC, scope of PT, HEP, and continued frequent mobility. Person educated: Patient Education method: Explanation, Demonstration, and Handouts Education comprehension: verbalized understanding, returned demonstration, verbal cues required, and tactile cues required  HOME EXERCISE PROGRAM: Initiate next session  ASSESSMENT:  CLINICAL IMPRESSION: Patient a  89 y.o. y.o. female who was seen today for physical therapy evaluation and treatment for gait and mobility deficits. Patient presents with pain limited deficits in bilateral LE strength, ROM, endurance, activity tolerance, gait, balance, and functional mobility with ADL. Patient is having to modify and restrict ADL as indicated by outcome measure score as well as subjective information and objective measures which is affecting overall participation. Patient will benefit from skilled physical therapy in  order to improve function and reduce impairment.  OBJECTIVE IMPAIRMENTS: Abnormal gait, decreased activity tolerance, decreased balance, decreased endurance, decreased mobility, difficulty walking, decreased ROM, decreased strength, impaired flexibility, improper body mechanics, and pain.   ACTIVITY LIMITATIONS: carrying, lifting, bending, standing, squatting, stairs, transfers, bathing, hygiene/grooming, locomotion level, and caring for others  PARTICIPATION LIMITATIONS: meal prep, cleaning, laundry, shopping, community activity, and yard work  PERSONAL FACTORS: Age, Fitness, Time since onset of injury/illness/exacerbation, and 3+ comorbidities: HTN, HLD, falls, rib fractures, osteoporosis  are also affecting patient's functional outcome.   REHAB POTENTIAL: Good  CLINICAL DECISION MAKING: Evolving/moderate complexity  EVALUATION COMPLEXITY: Moderate   GOALS: Goals reviewed with patient? Yes  SHORT TERM GOALS: Target date: 08/23/2023    Patient will be independent with HEP in order to improve functional outcomes. Baseline: Goal status: INITIAL  2.  Patient will report at least 25% improvement in symptoms for improved quality of life. Baseline: Goal status: INITIAL   LONG TERM GOALS: Target date: 09/20/2023    Patient will report at least 75% improvement in symptoms for improved quality of life. Baseline:  Goal status: INITIAL  2.  Patient will score at least 17/24 on DGI in order to  demonstrate improved balance to reduce the risk for falls at home and in the community.  Baseline:  Goal status: INITIAL  3.  Patient will be able to complete 5x STS in under 20 seconds in order to reduce the risk of falls. Baseline:  Goal status: INITIAL  4. Patient will demonstrate grade of 4+/5 MMT grade in all tested musculature as evidence of improved strength to assist with stair ambulation and gait.  Baseline:  Goal status: INITIAL      PLAN:  PT FREQUENCY: 1-2x/week  PT DURATION: 8 weeks  PLANNED INTERVENTIONS: 97164- PT Re-evaluation, 97110-Therapeutic exercises, 97530- Therapeutic activity, 97112- Neuromuscular re-education, 97535- Self Care, 16109- Manual therapy, [phone]- Gait training, [phone]- Orthotic Fit/training, [phone]- Canalith repositioning, [id number]- Aquatic Therapy, [phone]- Splinting, Patient/Family education, Balance training, Stair training, Taping, Dry Needling, Joint mobilization, Joint manipulation, Spinal manipulation, Spinal mobilization, Scar mobilization, and DME instructions.  PLAN FOR NEXT SESSION: functional strength, balance training, endurance training   Beather Liming, PT 07/26/2023, 1:52 PM

## 2023-08-01 DIAGNOSIS — L601 Onycholysis: Secondary | ICD-10-CM | POA: Diagnosis not present

## 2023-08-01 DIAGNOSIS — M792 Neuralgia and neuritis, unspecified: Secondary | ICD-10-CM | POA: Diagnosis not present

## 2023-08-01 DIAGNOSIS — B351 Tinea unguium: Secondary | ICD-10-CM | POA: Diagnosis not present

## 2023-08-13 DIAGNOSIS — B351 Tinea unguium: Secondary | ICD-10-CM | POA: Diagnosis not present

## 2023-08-22 ENCOUNTER — Ambulatory Visit: Admitting: Physical Therapy

## 2023-08-24 NOTE — Therapy (Signed)
 OUTPATIENT PHYSICAL THERAPY LOWER EXTREMITY TREATMENT   Patient Name: Susan Marks MRN: [id number] DOB:02-23-1926, 89 y.o., female Today's Date: 08/27/2023  END OF SESSION:  PT End of Session - 08/27/23 1537     Visit Number 2    Number of Visits 16    Date for PT Re-Evaluation 09/20/23    Authorization Type [employer]    PT Start Time 1532    PT Stop Time 1620    PT Time Calculation (min) 48 min    Activity Tolerance Patient tolerated treatment well    Behavior During Therapy WFL for tasks assessed/performed              Past Medical History:  Diagnosis Date   Chronic headaches    migraines   Chronic kidney disease    High blood pressure    High cholesterol    Past Surgical History:  Procedure Laterality Date   VESICOVAGINAL FISTULA CLOSURE W/ TAH     Patient Active Problem List   Diagnosis Date Noted   Multiple rib fractures 04/01/2023   Fall at home, initial encounter 04/01/2023   Acute hyponatremia 04/01/2023   HLD (hyperlipidemia) 04/01/2023   Anemia of chronic disease 04/01/2023   Closed flail chest 04/01/2023   LBBB (left bundle branch block) 03/30/2018   Diverticulitis of intestine with perforation without abscess 03/30/2018   Chest pain 03/10/2014   Essential hypertension 03/10/2014   Abnormal EKG 03/10/2014   GERD (gastroesophageal reflux disease) 07/07/2013   Granulomatous lung disease (HCC) 07/07/2013   Cough 06/13/2013    PCP: Olin Bertin, MD  REFERRING PROVIDER: Olin Bertin, MD  REFERRING DIAG: R26.89 (ICD-10-CM) - Other abnormalities of gait and mobility R29.6 (ICD-10-CM) - Repeated falls  THERAPY DIAG:  Other abnormalities of gait and mobility  Repeated falls  Muscle weakness (generalized)  Rationale for Evaluation and Treatment: Rehabilitation  ONSET DATE: Jan 2025  SUBJECTIVE:   SUBJECTIVE STATEMENT: Patient accompanied by caregiver who submitted a note containing the following information:  Serious  short term memory issues, needs repetitive prompting, R leg arthritis, comes on without warning - old injury; periods of weakness throughout the day; tires easily, many prior fallls, not a candidate for cane, does well with walker.   Eval: Patient states bad fall in Jan. 2025 when in the bathroom. Doesn't remember the event, getting memory back. Broke several ribs. Patient states that her and her husband are in her own home. 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 Using RW at home for mobility. Has caregiver support 24/7.   PERTINENT HISTORY: HTN, HLD, falls, rib fractures, osteoporosis  PAIN:  Are you having pain? Yes: NPRS scale: 1.5/10 Pain location: R hip Pain description: ache Aggravating factors: sitting for long time and then getting up Relieving factors: ice  PRECAUTIONS: Fall  WEIGHT BEARING RESTRICTIONS: No  FALLS:  Has patient fallen in last 6 months? Yes. Number of falls 2   PLOF: ambulates with RW, caregiver support 24/7  PATIENT GOALS: to get stronger  OBJECTIVE: (objective measures from initial evaluation unless otherwise dated)   COGNITION: Overall cognitive status: Within functional limits for tasks assessed     SENSATION: WFL  POSTURE: rounded shoulders, forward head, increased thoracic kyphosis, and flexed trunk    LOWER EXTREMITY ROM:  Active ROM Right eval Left eval  Hip flexion    Hip extension    Hip abduction    Hip adduction    Hip internal rotation  Hip external rotation    Knee flexion    Knee extension    Ankle dorsiflexion    Ankle plantarflexion    Ankle inversion    Ankle eversion     (Blank rows = not tested) *= pain/symptoms  LOWER EXTREMITY MMT:  MMT Right eval Left eval  Hip flexion 3+* 4  Hip extension    Hip abduction    Hip adduction    Hip internal rotation    Hip external rotation    Knee flexion 4* 5  Knee extension 4-* 5  Ankle dorsiflexion 5 5  Ankle plantarflexion    Ankle  inversion    Ankle eversion     (Blank rows = not tested) *= pain/symptoms   FUNCTIONAL TESTS:  5 times sit to stand: 35.69 seconds without UE support; slow, labored, bilateral LE valgus  GAIT: Distance walked: 100 feet Assistive device utilized: Walker - 2 wheeled Level of assistance: CGA Comments: ambulates with RW with out loss of balance DGI - with RW 1. Gait level surface (2) Mild Impairment: Walks 20', uses assistive devices, slower speed, mild gait deviations. 2. Change in gait speed (2) Mild Impairment: Is able to change speed but demonstrates mild gait deviations, or not gait deviations but unable to achieve a significant change in velocity, or uses an assistive device. 3. Gait with horizontal head turns (2) Mild Impairment: Performs head turns smoothly with slight change in gait velocity, i.e., minor disruption to smooth gait path or uses walking aid. 4. Gait with vertical head turns (2) Mild Impairment: Performs head turns smoothly with slight change in gait velocity, i.e., minor disruption to smooth gait path or uses walking aid. 5. Gait and pivot turn (1) Moderate Impairment: Turns slowly, requires verbal cueing, requires several small steps to catch balance following turn and stop. 6. Step over obstacle (1) Moderate Impairment: Is able to step over box but must stop, then step over. May require verbal cueing. 7. Step around obstacles (1) Moderate Impairment: Is able to clear cones but must significantly slow, speed to accomplish task, or requires verbal cueing. 8. Stairs (2) Mild Impairment: Alternating feet, must use rail.  TOTAL SCORE: 13 / 24   TODAY'S TREATMENT:                                                                                                                              DATE:  08/24/23 Nustep L1 x 5 min Sit to stand x 10 with B UE support Seated: marching 2x10, Heel/toe raises, LAQ  x 20, ER 1x 10 B, IR x 10 B Seated clam blue loop x 20 (some  pain reported in R medial knee) At [facility]: Hip ABD , ext, knee flex x 10 ea B Self Care: walker legs changed out, discussed scheduling and appropriate walker size with caregiver  07/26/23 Eval and education    PATIENT EDUCATION:  Education details: Patient educated on exam findings, POC, scope of  PT, HEP, and continued frequent mobility. Person educated: Patient Education method: Explanation, Demonstration, and Handouts Education comprehension: verbalized understanding, returned demonstration, verbal cues required, and tactile cues required  HOME EXERCISE PROGRAM: Access Code: [email or web address] URL: [email or web address] Date: 08/27/2023 Prepared by: Concha Deed  Exercises - Sit to Stand with Counter Support  - 3 x daily - 3 x weekly - 1 sets - 5-10 reps - Heel Raises with Counter Support  - 1 x daily - 3 x weekly - 2 sets - 10 reps - Seated March  - 1 x daily - 3 x weekly - 2 sets - 10 reps - Seated Hip Internal Rotation AROM  - 1 x daily - 3 x weekly - 2 sets - 10 reps  ASSESSMENT:  CLINICAL IMPRESSION: Patient tolerates exercise fairly well, but does c/o pain in the right greater trochanter and medial R knee intermittently throughout session. Short rest breaks allow her to return to exercise. HEP initiated.   Patient a 89 y.o. y.o. female who was seen today for physical therapy evaluation and treatment for gait and mobility deficits. Patient presents with pain limited deficits in bilateral LE strength, ROM, endurance, activity tolerance, gait, balance, and functional mobility with ADL. Patient is having to modify and restrict ADL as indicated by outcome measure score as well as subjective information and objective measures which is affecting overall participation. Patient will benefit from skilled physical therapy in order to improve function and reduce impairment.  OBJECTIVE IMPAIRMENTS: Abnormal gait, decreased activity tolerance, decreased balance, decreased endurance, decreased  mobility, difficulty walking, decreased ROM, decreased strength, impaired flexibility, improper body mechanics, and pain.   ACTIVITY LIMITATIONS: carrying, lifting, bending, standing, squatting, stairs, transfers, bathing, hygiene/grooming, locomotion level, and caring for others  PARTICIPATION LIMITATIONS: meal prep, cleaning, laundry, shopping, community activity, and yard work  PERSONAL FACTORS: Age, Fitness, Time since onset of injury/illness/exacerbation, and 3+ comorbidities: HTN, HLD, falls, rib fractures, osteoporosis  are also affecting patient's functional outcome.   REHAB POTENTIAL: Good  CLINICAL DECISION MAKING: Evolving/moderate complexity  EVALUATION COMPLEXITY: Moderate   GOALS: Goals reviewed with patient? Yes  SHORT TERM GOALS: Target date: 08/23/2023    Patient will be independent with HEP in order to improve functional outcomes. Baseline: Goal status: INITIAL  2.  Patient will report at least 25% improvement in symptoms for improved quality of life. Baseline: Goal status: INITIAL   LONG TERM GOALS: Target date: 09/20/2023    Patient will report at least 75% improvement in symptoms for improved quality of life. Baseline:  Goal status: INITIAL  2.  Patient will score at least 17/24 on DGI in order to demonstrate improved balance to reduce the risk for falls at home and in the community.  Baseline:  Goal status: INITIAL  3.  Patient will be able to complete 5x STS in under 20 seconds in order to reduce the risk of falls. Baseline:  Goal status: INITIAL  4. Patient will demonstrate grade of 4+/5 MMT grade in all tested musculature as evidence of improved strength to assist with stair ambulation and gait.  Baseline:  Goal status: INITIAL      PLAN:  PT FREQUENCY: 1-2x/week  PT DURATION: 8 weeks  PLANNED INTERVENTIONS: 97164- PT Re-evaluation, 97110-Therapeutic exercises, 97530- Therapeutic activity, 97112- Neuromuscular re-education, 97535- Self  Care, 54098- Manual therapy, [phone]- Gait training, [phone]- Orthotic Fit/training, [phone]- Canalith repositioning, [id number]- Aquatic Therapy, [phone]- Splinting, Patient/Family education, Balance training, Stair training, Taping, Dry Needling, Joint mobilization, Joint manipulation, Spinal manipulation, Spinal mobilization, Scar mobilization, and  DME instructions.  PLAN FOR NEXT SESSION: functional strength, balance training, endurance training   Shiori Adcox, PT 08/27/2023, 5:17 PM

## 2023-08-27 ENCOUNTER — Encounter: Payer: Self-pay | Admitting: Physical Therapy

## 2023-08-27 ENCOUNTER — Ambulatory Visit: Attending: Family Medicine | Admitting: Physical Therapy

## 2023-08-27 DIAGNOSIS — R2689 Other abnormalities of gait and mobility: Secondary | ICD-10-CM | POA: Insufficient documentation

## 2023-08-27 DIAGNOSIS — M6281 Muscle weakness (generalized): Secondary | ICD-10-CM | POA: Insufficient documentation

## 2023-08-27 DIAGNOSIS — R296 Repeated falls: Secondary | ICD-10-CM | POA: Diagnosis not present

## 2023-08-29 ENCOUNTER — Ambulatory Visit: Payer: PPO | Admitting: Neurology

## 2023-08-29 DIAGNOSIS — B351 Tinea unguium: Secondary | ICD-10-CM | POA: Diagnosis not present

## 2023-08-29 DIAGNOSIS — M792 Neuralgia and neuritis, unspecified: Secondary | ICD-10-CM | POA: Diagnosis not present

## 2023-09-03 ENCOUNTER — Ambulatory Visit: Admitting: Physical Therapy

## 2023-09-03 ENCOUNTER — Encounter: Payer: Self-pay | Admitting: Physical Therapy

## 2023-09-03 DIAGNOSIS — M6281 Muscle weakness (generalized): Secondary | ICD-10-CM

## 2023-09-03 DIAGNOSIS — R296 Repeated falls: Secondary | ICD-10-CM

## 2023-09-03 DIAGNOSIS — R2689 Other abnormalities of gait and mobility: Secondary | ICD-10-CM

## 2023-09-03 NOTE — Therapy (Addendum)
 OUTPATIENT PHYSICAL THERAPY LOWER EXTREMITY TREATMENT AND DISCHARGE SUMMARY   Patient Name: Susan Marks MRN: [id number] DOB:Jan 22, 1926, 89 y.o., female Today's Date: 09/03/2023  END OF SESSION:  PT End of Session - 09/03/23 1619     Visit Number 3    Number of Visits 16    Date for PT Re-Evaluation 09/20/23    Authorization Type [employer]    Progress Note Due on Visit 10    PT Start Time 1532    PT Stop Time 1614    PT Time Calculation (min) 42 min    Activity Tolerance Patient tolerated treatment well    Behavior During Therapy WFL for tasks assessed/performed            Past Medical History:  Diagnosis Date   Chronic headaches    migraines   Chronic kidney disease    High blood pressure    High cholesterol    Past Surgical History:  Procedure Laterality Date   VESICOVAGINAL FISTULA CLOSURE W/ TAH     Patient Active Problem List   Diagnosis Date Noted   Multiple rib fractures 04/01/2023   Fall at home, initial encounter 04/01/2023   Acute hyponatremia 04/01/2023   HLD (hyperlipidemia) 04/01/2023   Anemia of chronic disease 04/01/2023   Closed flail chest 04/01/2023   LBBB (left bundle branch block) 03/30/2018   Diverticulitis of intestine with perforation without abscess 03/30/2018   Chest pain 03/10/2014   Essential hypertension 03/10/2014   Abnormal EKG 03/10/2014   GERD (gastroesophageal reflux disease) 07/07/2013   Granulomatous lung disease (HCC) 07/07/2013   Cough 06/13/2013    PCP: Verena Mems, MD  REFERRING PROVIDER: Verena Mems, MD  REFERRING DIAG: R26.89 (ICD-10-CM) - Other abnormalities of gait and mobility R29.6 (ICD-10-CM) - Repeated falls  THERAPY DIAG:  Other abnormalities of gait and mobility  Repeated falls  Muscle weakness (generalized)  Rationale for Evaluation and Treatment: Rehabilitation  ONSET DATE: Jan 2025  SUBJECTIVE:   SUBJECTIVE STATEMENT: Patient reports she is doing good today. She is  not currently having any pain.  Patient accompanied by caregiver who submitted a note containing the following information:  Serious short term memory issues, needs repetitive prompting, R leg arthritis, comes on without warning - old injury; periods of weakness throughout the day; tires easily, many prior fallls, not a candidate for cane, does well with walker.   Eval: Patient states bad fall in Jan. 2025 when in the bathroom. Doesn't remember the event, getting memory back. Broke several ribs. Patient states that her and her husband are in her own home. 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 Using RW at home for mobility. Has caregiver support 24/7.   PERTINENT HISTORY: HTN, HLD, falls, rib fractures, osteoporosis  PAIN:  Are you having pain? Yes: NPRS scale: 1.5/10 Pain location: R hip Pain description: ache Aggravating factors: sitting for long time and then getting up Relieving factors: ice  PRECAUTIONS: Fall  WEIGHT BEARING RESTRICTIONS: No  FALLS:  Has patient fallen in last 6 months? Yes. Number of falls 2   PLOF: ambulates with RW, caregiver support 24/7  PATIENT GOALS: to get stronger  OBJECTIVE: (objective measures from initial evaluation unless otherwise dated)   COGNITION: Overall cognitive status: Within functional limits for tasks assessed     SENSATION: WFL  POSTURE: rounded shoulders, forward head, increased thoracic kyphosis, and flexed trunk    LOWER EXTREMITY ROM:  Active ROM Right eval Left eval  Hip flexion    Hip extension    Hip abduction    Hip adduction    Hip internal rotation    Hip external rotation    Knee flexion    Knee extension    Ankle dorsiflexion    Ankle plantarflexion    Ankle inversion    Ankle eversion     (Blank rows = not tested) *= pain/symptoms  LOWER EXTREMITY MMT:  MMT Right eval Left eval  Hip flexion 3+* 4  Hip extension    Hip abduction    Hip adduction    Hip internal  rotation    Hip external rotation    Knee flexion 4* 5  Knee extension 4-* 5  Ankle dorsiflexion 5 5  Ankle plantarflexion    Ankle inversion    Ankle eversion     (Blank rows = not tested) *= pain/symptoms   FUNCTIONAL TESTS:  5 times sit to stand: 35.69 seconds without UE support; slow, labored, bilateral LE valgus  GAIT: Distance walked: 100 feet Assistive device utilized: Walker - 2 wheeled Level of assistance: CGA Comments: ambulates with RW with out loss of balance DGI - with RW 1. Gait level surface (2) Mild Impairment: Walks 20', uses assistive devices, slower speed, mild gait deviations. 2. Change in gait speed (2) Mild Impairment: Is able to change speed but demonstrates mild gait deviations, or not gait deviations but unable to achieve a significant change in velocity, or uses an assistive device. 3. Gait with horizontal head turns (2) Mild Impairment: Performs head turns smoothly with slight change in gait velocity, i.e., minor disruption to smooth gait path or uses walking aid. 4. Gait with vertical head turns (2) Mild Impairment: Performs head turns smoothly with slight change in gait velocity, i.e., minor disruption to smooth gait path or uses walking aid. 5. Gait and pivot turn (1) Moderate Impairment: Turns slowly, requires verbal cueing, requires several small steps to catch balance following turn and stop. 6. Step over obstacle (1) Moderate Impairment: Is able to step over box but must stop, then step over. May require verbal cueing. 7. Step around obstacles (1) Moderate Impairment: Is able to clear cones but must significantly slow, speed to accomplish task, or requires verbal cueing. 8. Stairs (2) Mild Impairment: Alternating feet, must use rail.  TOTAL SCORE: 13 / 24   TODAY'S TREATMENT:                                                                                                                              DATE:  09/03/23 Nustep L5 x 5 min Weighted  patient at 111 lbs Sit to stand x 10 with B UE support Attempted hands on knees sit to stand but too hard Standing hip abduction + march x 20 each at barre Weight shifts on airex (side to side & staggered) x 1 min each direction at barre Airex step ups x 8 bilateral  Seated LAQ 2  x 10 bilateral (some discomfort on right hip) Seated adduction ball squeeze x 20 Seated clamshell with yellow loop 2 x 10 Attempted to stand but patient got a little lightheaded Seated knee flexion with slider x 10 bilateral      08/24/23 Nustep L1 x 5 min Sit to stand x 10 with B UE support Seated: marching 2x10, Heel/toe raises, LAQ  x 20, ER 1x 10 B, IR x 10 B Seated clam blue loop x 20 (some pain reported in R medial knee) At [facility]: Hip ABD , ext, knee flex x 10 ea B Self Care: walker legs changed out, discussed scheduling and appropriate walker size with caregiver  07/26/23 Eval and education    PATIENT EDUCATION:  Education details: Patient educated on exam findings, POC, scope of PT, HEP, and continued frequent mobility. Person educated: Patient Education method: Explanation, Demonstration, and Handouts Education comprehension: verbalized understanding, returned demonstration, verbal cues required, and tactile cues required  HOME EXERCISE PROGRAM: Access Code: [email or web address] URL: [email or web address] Date: 08/27/2023 Prepared by: Mliss  Exercises - Sit to Stand with Counter Support  - 3 x daily - 3 x weekly - 1 sets - 5-10 reps - Heel Raises with Counter Support  - 1 x daily - 3 x weekly - 2 sets - 10 reps - Seated March  - 1 x daily - 3 x weekly - 2 sets - 10 reps - Seated Hip Internal Rotation AROM  - 1 x daily - 3 x weekly - 2 sets - 10 reps  ASSESSMENT:  CLINICAL IMPRESSION: Beverley presents to therapy with no new complaints. She tolerated treatment session well and did not verbalize any increased pain or discomfort. PT closely guarded patient with exercises on unstable surface.  She required occasional rest breaks due to fatigue. Patient will benefit from skilled PT to address the below impairments and improve overall function.     Patient a 89 y.o. y.o. female who was seen today for physical therapy evaluation and treatment for gait and mobility deficits. Patient presents with pain limited deficits in bilateral LE strength, ROM, endurance, activity tolerance, gait, balance, and functional mobility with ADL. Patient is having to modify and restrict ADL as indicated by outcome measure score as well as subjective information and objective measures which is affecting overall participation. Patient will benefit from skilled physical therapy in order to improve function and reduce impairment.  OBJECTIVE IMPAIRMENTS: Abnormal gait, decreased activity tolerance, decreased balance, decreased endurance, decreased mobility, difficulty walking, decreased ROM, decreased strength, impaired flexibility, improper body mechanics, and pain.   ACTIVITY LIMITATIONS: carrying, lifting, bending, standing, squatting, stairs, transfers, bathing, hygiene/grooming, locomotion level, and caring for others  PARTICIPATION LIMITATIONS: meal prep, cleaning, laundry, shopping, community activity, and yard work  PERSONAL FACTORS: Age, Fitness, Time since onset of injury/illness/exacerbation, and 3+ comorbidities: HTN, HLD, falls, rib fractures, osteoporosis  are also affecting patient's functional outcome.   REHAB POTENTIAL: Good  CLINICAL DECISION MAKING: Evolving/moderate complexity  EVALUATION COMPLEXITY: Moderate   GOALS: Goals reviewed with patient? Yes  SHORT TERM GOALS: Target date: 08/23/2023    Patient will be independent with HEP in order to improve functional outcomes. Baseline: Goal status: INITIAL  2.  Patient will report at least 25% improvement in symptoms for improved quality of life. Baseline: Goal status: INITIAL   LONG TERM GOALS: Target date: 09/20/2023    Patient  will report at least 75% improvement in symptoms for improved quality of life. Baseline:  Goal status: INITIAL  2.  Patient  will score at least 17/24 on DGI in order to demonstrate improved balance to reduce the risk for falls at home and in the community.  Baseline:  Goal status: INITIAL  3.  Patient will be able to complete 5x STS in under 20 seconds in order to reduce the risk of falls. Baseline:  Goal status: INITIAL  4. Patient will demonstrate grade of 4+/5 MMT grade in all tested musculature as evidence of improved strength to assist with stair ambulation and gait.  Baseline:  Goal status: INITIAL      PLAN:  PT FREQUENCY: 1-2x/week  PT DURATION: 8 weeks  PLANNED INTERVENTIONS: 97164- PT Re-evaluation, 97110-Therapeutic exercises, 97530- Therapeutic activity, 97112- Neuromuscular re-education, 97535- Self Care, [id number]- Manual therapy, [phone]- Gait training, [phone]- Orthotic Fit/training, [phone]- Canalith repositioning, [id number]- Aquatic Therapy, [phone]- Splinting, Patient/Family education, Balance training, Stair training, Taping, Dry Needling, Joint mobilization, Joint manipulation, Spinal manipulation, Spinal mobilization, Scar mobilization, and DME instructions.  PLAN FOR NEXT SESSION: functional strength, balance training, endurance training   Kristeen Sar, PT 09/03/23 4:23 PM   PHYSICAL THERAPY DISCHARGE SUMMARY  Visits from Start of Care: 3  Current functional level related to goals / functional outcomes: No change in symptoms, but PT making it worse.   Remaining deficits: No change from evaluation.   Education / Equipment: HEP   Patient agrees to discharge. Patient goals were not met. Patient is being discharged due to the patient's request.  PT phoned pt regarding missed appointment and she states that PT is making her pain worse. She would like to discharge.   Mliss Cummins, PT 10/01/23 2:30 PM  [facility] [address],  [address] [place], [place] [address] Phone # [phone] Fax [phone]

## 2023-09-05 ENCOUNTER — Ambulatory Visit: Payer: PPO | Admitting: Neurology

## 2023-09-10 ENCOUNTER — Ambulatory Visit: Admitting: Physical Therapy

## 2023-09-18 ENCOUNTER — Ambulatory Visit: Admitting: Neurology

## 2023-09-24 ENCOUNTER — Other Ambulatory Visit: Payer: Self-pay

## 2023-09-24 ENCOUNTER — Encounter (HOSPITAL_COMMUNITY): Payer: Self-pay | Admitting: Emergency Medicine

## 2023-09-24 ENCOUNTER — Emergency Department (HOSPITAL_COMMUNITY)
Admission: EM | Admit: 2023-09-24 | Discharge: 2023-09-24 | Disposition: A | Discharge status: Home / Self Care | Attending: Emergency Medicine | Admitting: Emergency Medicine

## 2023-09-24 DIAGNOSIS — R55 Syncope and collapse: Secondary | ICD-10-CM | POA: Insufficient documentation

## 2023-09-24 DIAGNOSIS — R1010 Upper abdominal pain, unspecified: Secondary | ICD-10-CM | POA: Diagnosis not present

## 2023-09-24 DIAGNOSIS — N189 Chronic kidney disease, unspecified: Secondary | ICD-10-CM | POA: Insufficient documentation

## 2023-09-24 DIAGNOSIS — R1084 Generalized abdominal pain: Secondary | ICD-10-CM | POA: Diagnosis not present

## 2023-09-24 DIAGNOSIS — R231 Pallor: Secondary | ICD-10-CM | POA: Diagnosis not present

## 2023-09-24 DIAGNOSIS — R42 Dizziness and giddiness: Secondary | ICD-10-CM | POA: Diagnosis not present

## 2023-09-24 LAB — CBC WITH DIFFERENTIAL/PLATELET
Abs Immature Granulocytes: 0.02 K/uL (ref 0.00–0.07)
Basophils Absolute: 0 K/uL (ref 0.0–0.1)
Basophils Relative: 0 %
Eosinophils Absolute: 0.2 K/uL (ref 0.0–0.5)
Eosinophils Relative: 3 %
HCT: 36 % (ref 36.0–46.0)
Hemoglobin: 11.7 g/dL — ABNORMAL LOW (ref 12.0–15.0)
Immature Granulocytes: 0 %
Lymphocytes Relative: 18 %
Lymphs Abs: 1.1 K/uL (ref 0.7–4.0)
MCH: 33.7 pg (ref 26.0–34.0)
MCHC: 32.5 g/dL (ref 30.0–36.0)
MCV: 103.7 fL — ABNORMAL HIGH (ref 80.0–100.0)
Monocytes Absolute: 0.6 K/uL (ref 0.1–1.0)
Monocytes Relative: 10 %
Neutro Abs: 4.1 K/uL (ref 1.7–7.7)
Neutrophils Relative %: 69 %
Platelets: 207 K/uL (ref 150–400)
RBC: 3.47 MIL/uL — ABNORMAL LOW (ref 3.87–5.11)
RDW: 12.3 % (ref 11.5–15.5)
WBC: 5.9 K/uL (ref 4.0–10.5)
nRBC: 0 % (ref 0.0–0.2)

## 2023-09-24 LAB — COMPREHENSIVE METABOLIC PANEL WITH GFR
ALT: 20 U/L (ref 0–44)
AST: 26 U/L (ref 15–41)
Albumin: 3.8 g/dL (ref 3.5–5.0)
Alkaline Phosphatase: 41 U/L (ref 38–126)
Anion gap: 12 (ref 5–15)
BUN: 27 mg/dL — ABNORMAL HIGH (ref 8–23)
CO2: 20 mmol/L — ABNORMAL LOW (ref 22–32)
Calcium: 9.7 mg/dL (ref 8.9–10.3)
Chloride: 104 mmol/L (ref 98–111)
Creatinine, Ser: 1.1 mg/dL — ABNORMAL HIGH (ref 0.44–1.00)
GFR, Estimated: 46 mL/min — ABNORMAL LOW (ref >60)
Glucose, Bld: 99 mg/dL (ref 70–99)
Potassium: 3.9 mmol/L (ref 3.5–5.1)
Sodium: 136 mmol/L (ref 135–145)
Total Bilirubin: 0.8 mg/dL (ref 0.0–1.2)
Total Protein: 6.5 g/dL (ref 6.5–8.1)

## 2023-09-24 LAB — I-STAT CHEM 8, ED
BUN: 30 mg/dL — ABNORMAL HIGH (ref 8–23)
Calcium, Ion: 1.19 mmol/L (ref 1.15–1.40)
Chloride: 104 mmol/L (ref 98–111)
Creatinine, Ser: 1.1 mg/dL — ABNORMAL HIGH (ref 0.44–1.00)
Glucose, Bld: 96 mg/dL (ref 70–99)
HCT: 36 % (ref 36.0–46.0)
Hemoglobin: 12.2 g/dL (ref 12.0–15.0)
Potassium: 3.9 mmol/L (ref 3.5–5.1)
Sodium: 136 mmol/L (ref 135–145)
TCO2: 22 mmol/L (ref 22–32)

## 2023-09-24 MED ORDER — SODIUM CHLORIDE 0.9 % IV BOLUS
1000.0000 mL | Freq: Once | INTRAVENOUS | Status: AC
Start: 2023-09-24 — End: 2023-09-24
  Administered 2023-09-24: 1000 mL via INTRAVENOUS

## 2023-09-24 NOTE — ED Notes (Signed)
Pt discharged. Pt given discharge papers and papers explained. Pt in NAD at this time

## 2023-09-24 NOTE — ED Provider Notes (Signed)
 [place] [facility] AT [facility] Provider Note  CSN: [id number] Arrival date & time: 09/24/23 0320  Chief Complaint(s) Loss of Consciousness  HPI Susan Marks is a 89 y.o. female     Loss of Consciousness Episode history:  Single Most recent episode:  Today Timing:  Constant Progression:  Resolved Chronicity:  New Context: bowel movement   Witnessed: yes   Associated symptoms: dizziness, malaise/fatigue and weakness (generalized)   Associated symptoms: no chest pain, no difficulty breathing, no focal weakness, no nausea, no palpitations, no rectal bleeding, no seizures and no vomiting     Past Medical History Past Medical History:  Diagnosis Date   Chronic headaches    migraines   Chronic kidney disease    High blood pressure    High cholesterol    Patient Active Problem List   Diagnosis Date Noted   Multiple rib fractures 04/01/2023   Fall at home, initial encounter 04/01/2023   Acute hyponatremia 04/01/2023   HLD (hyperlipidemia) 04/01/2023   Anemia of chronic disease 04/01/2023   Closed flail chest 04/01/2023   LBBB (left bundle branch block) 03/30/2018   Diverticulitis of intestine with perforation without abscess 03/30/2018   Chest pain 03/10/2014   Essential hypertension 03/10/2014   Abnormal EKG 03/10/2014   GERD (gastroesophageal reflux disease) 07/07/2013   Granulomatous lung disease (HCC) 07/07/2013   Cough 06/13/2013   Home Medication(s) Prior to Admission medications   Medication Sig Start Date End Date Taking? Authorizing Provider  acetaminophen  (TYLENOL ) 500 MG tablet Take 2 tablets 3 times a day for 2 days and then 1 tablet every 4 hours as needed for mild pain. 04/05/23   Krishnan, Gokul, MD  amLODipine  (NORVASC ) 2.5 MG tablet Take 1 tablet (2.5 mg total) by mouth daily. 04/05/23 04/04/24  Krishnan, Gokul, MD  Cyanocobalamin  (VITAMIN B-12 PO) Take 1 capsule by mouth daily.    [provider]  donepezil  (ARICEPT )  10 MG tablet Take 1 tablet (10 mg total) by mouth at bedtime. 11/02/22   Ines Onetha NOVAK, MD  famotidine  (PEPCID ) 40 MG tablet Take 40 mg by mouth daily.    [provider]  fenofibrate  micronized (LOFIBRA) 200 MG capsule Take 200 mg by mouth at bedtime.    [provider]  folic acid  (FOLVITE ) 1 MG tablet Take 1 tablet (1 mg total) by mouth daily. 04/06/23   Verdene Purchase, MD  lidocaine  (LIDODERM ) 5 % Place 1 patch onto the skin daily. Remove & Discard patch within 12 hours or as directed by MD. Apply to Ribs on the right 04/05/23   Verdene Purchase, MD  melatonin 3 MG TABS tablet Take 1 tablet (3 mg total) by mouth at bedtime as needed (insomnia). 04/05/23   Krishnan, Gokul, MD  methocarbamol  (ROBAXIN ) 500 MG tablet Take 1 tablet (500 mg total) by mouth every 8 (eight) hours as needed for muscle spasms. 04/05/23   Krishnan, Gokul, MD  oxyCODONE  (OXY IR/ROXICODONE ) 5 MG immediate release tablet Take 0.5-1 tablets (2.5-5 mg total) by mouth every 4 (four) hours as needed (2.5mg  for moderate pain, 5mg  for severe pain). 04/05/23   Krishnan, Gokul, MD  polyethylene glycol (MIRALAX  / GLYCOLAX ) 17 g packet Take 17 g by mouth 2 (two) times daily. 04/05/23   Krishnan, Gokul, MD  senna-docusate (SENOKOT-S) 8.6-50 MG tablet Take 1 tablet by mouth 2 (two) times daily. 04/05/23   Krishnan, Gokul, MD  sodium chloride  1 g tablet Take 2 tablets (2 g total) by mouth 2 (  two) times daily with a meal. 04/05/23   Verdene Purchase, MD  VITAMIN D  PO Take 1 capsule by mouth daily.    [provider]                                                                                                                                    Allergies Sulfa antibiotics, Alendronate, Asa [aspirin], Azithromycin, Lisinopril, Other, and Statins  Review of Systems Review of Systems  Constitutional:  Positive for malaise/fatigue.  Cardiovascular:  Positive for syncope. Negative for chest pain and palpitations.   Gastrointestinal:  Negative for nausea and vomiting.  Neurological:  Positive for dizziness and weakness (generalized). Negative for focal weakness and seizures.   As noted in HPI  Physical Exam Vital Signs  I have reviewed the triage vital signs BP (!) 167/105   Pulse (!) 54   Resp 17   SpO2 100%   Physical Exam Vitals reviewed.  Constitutional:      General: She is not in acute distress.    Appearance: She is well-developed. She is not diaphoretic.  HENT:     Head: Normocephalic and atraumatic.     Nose: Nose normal.  Eyes:     General: No scleral icterus.       Right eye: No discharge.        Left eye: No discharge.     Conjunctiva/sclera: Conjunctivae normal.     Pupils: Pupils are equal, round, and reactive to light.  Cardiovascular:     Rate and Rhythm: Normal rate and regular rhythm.     Heart sounds: No murmur heard.    No friction rub. No gallop.  Pulmonary:     Effort: Pulmonary effort is normal. No respiratory distress.     Breath sounds: Normal breath sounds. No stridor. No rales.  Abdominal:     General: There is no distension.     Palpations: Abdomen is soft.     Tenderness: There is no abdominal tenderness.  Musculoskeletal:        General: No tenderness.     Cervical back: Normal range of motion and neck supple.  Skin:    General: Skin is warm and dry.     Findings: No erythema or rash.  Neurological:     Mental Status: She is alert and oriented to person, place, and time.     ED Results and Treatments Labs (all labs ordered are listed, but only abnormal results are displayed) Labs Reviewed  CBC WITH DIFFERENTIAL/PLATELET - Abnormal; Notable for the following components:      Result Value   RBC 3.47 (*)    Hemoglobin 11.7 (*)    MCV 103.7 (*)    All other components within normal limits  COMPREHENSIVE METABOLIC PANEL WITH GFR - Abnormal; Notable for the following components:   CO2 20 (*)    BUN 27 (*)    Creatinine, Ser 1.10 (*)  GFR,  Estimated 46 (*)    All other components within normal limits  I-STAT CHEM 8, ED - Abnormal; Notable for the following components:   BUN 30 (*)    Creatinine, Ser 1.10 (*)    All other components within normal limits  CBG MONITORING, ED                                                                                                                         EKG  EKG Interpretation Date/Time:  Monday September 24 2023 03:27:01 EDT Ventricular Rate:  51 PR Interval:  240 QRS Duration:  136 QT Interval:  437 QTC Calculation: 403 R Axis:   -77  Text Interpretation: Sinus rhythm Prolonged PR interval LVH with IVCD, LAD and secondary repol abnrm Left bundle branch block No old tracing to compare Confirmed by Trine Likes [phone]) on 09/24/2023 3:36:24 AM       Radiology No results found.  Medications Ordered in ED Medications  sodium chloride  0.9 % bolus 1,000 mL (0 mLs Intravenous Stopped 09/24/23 0518)   Procedures Procedures  (including critical care time) Medical Decision Making / ED Course   Medical Decision Making Amount and/or Complexity of Data Reviewed Labs: ordered. Decision-making details documented in ED Course. ECG/medicine tests: ordered and independent interpretation performed. Decision-making details documented in ED Course.     Clinical Course as of 09/24/23 0552  Mon Sep 24, 2023  0356 Syncopal episode at home witnessed by caregiver in the setting of multiple bowel movements throughout the night.  Differential diagnosis considered noted below  [PC]  0400 EKG with left bundle branch block.  Baseline for patient.  No dysrhythmias. Slight bradycardia. Close to baseline. BP elevated.  Patient denied any palpitations, chest pain or shortness of breath concerning for primary cardiac etiology. [PC]  0427 CBC WITH DIFFERENTIAL(!) No leukocytosis.  Anemia with a hemoglobin of 11.7 which is improved from prior.  Patient denied any melena or hematochezia with the bowel  movements.  Unlikely GI bleed. [PC]  0550 Comprehensive metabolic panel(!) No significant electrolyte derangements.  Mild renal insufficiency without AKI. [PC]  9448 Patient ambulated well with minimal assistance down the hallway.  Did not feel lightheaded or orthostatic. Felt well enough to go home.  Son at bedside as well now. [PC]    Clinical Course User Index [PC] Josimar Corning, Likes Moder, MD    Final Clinical Impression(s) / ED Diagnoses Final diagnoses:  None   The patient appears reasonably screened and/or stabilized for discharge and I doubt any other medical condition or other [facility] requiring further screening, evaluation, or treatment in the ED at this time. I have discussed the findings, Dx and Tx plan with the patient/family who expressed understanding and agree(s) with the plan. Discharge instructions discussed at length. The patient/family was given strict return precautions who verbalized understanding of the instructions. No further questions at time of discharge.  Disposition: Discharge  Condition: Good  ED Discharge Orders  None        Follow Up: Verena Mems, MD [address] [place] [address] [phone]  Call  to schedule an appointment for close follow up    This chart was dictated using voice recognition software.  Despite best efforts to proofread,  errors can occur which can change the documentation meaning.    Trine Raynell Moder, MD 09/24/23 [phone]

## 2023-09-24 NOTE — ED Triage Notes (Signed)
 Pt coming in after a syncopal episode at home. Pt having some abd pain. Pt in NAD at this time

## 2023-09-24 NOTE — ED Notes (Signed)
 Pt ambulated around department with assistance. Pt able to ambulate without problems

## 2023-09-25 NOTE — Therapy (Incomplete)
 OUTPATIENT PHYSICAL THERAPY LOWER EXTREMITY TREATMENT AND RE-CERTIFICATION   Patient Name: Susan Marks MRN: [id number] DOB:Aug 25, 1925, 89 y.o., female Today's Date: 09/25/2023  END OF SESSION:      Past Medical History:  Diagnosis Date   Chronic headaches    migraines   Chronic kidney disease    High blood pressure    High cholesterol    Past Surgical History:  Procedure Laterality Date   VESICOVAGINAL FISTULA CLOSURE W/ TAH     Patient Active Problem List   Diagnosis Date Noted   Multiple rib fractures 04/01/2023   Fall at home, initial encounter 04/01/2023   Acute hyponatremia 04/01/2023   HLD (hyperlipidemia) 04/01/2023   Anemia of chronic disease 04/01/2023   Closed flail chest 04/01/2023   LBBB (left bundle branch block) 03/30/2018   Diverticulitis of intestine with perforation without abscess 03/30/2018   Chest pain 03/10/2014   Essential hypertension 03/10/2014   Abnormal EKG 03/10/2014   GERD (gastroesophageal reflux disease) 07/07/2013   Granulomatous lung disease (HCC) 07/07/2013   Cough 06/13/2013    PCP: Verena Mems, MD  REFERRING PROVIDER: Verena Mems, MD  REFERRING DIAG: R26.89 (ICD-10-CM) - Other abnormalities of gait and mobility R29.6 (ICD-10-CM) - Repeated falls  THERAPY DIAG:  No diagnosis found.  Rationale for Evaluation and Treatment: Rehabilitation  ONSET DATE: Jan 2025  SUBJECTIVE:   SUBJECTIVE STATEMENT: ***  syncope?  08/27/23:Patient accompanied by caregiver who submitted a note containing the following information:  Serious short term memory issues, needs repetitive prompting, R leg arthritis, comes on without warning - old injury; periods of weakness throughout the day; tires easily, many prior fallls, not a candidate for cane, does well with walker.   Eval: Patient states bad fall in Jan. 2025 when in the bathroom. Doesn't remember the event, getting memory back. Broke several ribs. Patient states that her and  her husband are in her own home. 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 Using RW at home for mobility. Has caregiver support 24/7.   PERTINENT HISTORY: HTN, HLD, falls, rib fractures, osteoporosis  PAIN:  Are you having pain? Yes: NPRS scale: 1.5/10 Pain location: R hip Pain description: ache Aggravating factors: sitting for long time and then getting up Relieving factors: ice  PRECAUTIONS: Fall  WEIGHT BEARING RESTRICTIONS: No  FALLS:  Has patient fallen in last 6 months? Yes. Number of falls 2   PLOF: ambulates with RW, caregiver support 24/7  PATIENT GOALS: to get stronger  OBJECTIVE: (objective measures from initial evaluation unless otherwise dated)   COGNITION: Overall cognitive status: Within functional limits for tasks assessed     SENSATION: WFL  POSTURE: rounded shoulders, forward head, increased thoracic kyphosis, and flexed trunk    LOWER EXTREMITY ROM:  Active ROM Right eval Left eval  Hip flexion    Hip extension    Hip abduction    Hip adduction    Hip internal rotation    Hip external rotation    Knee flexion    Knee extension    Ankle dorsiflexion    Ankle plantarflexion    Ankle inversion    Ankle eversion     (Blank rows = not tested) *= pain/symptoms  LOWER EXTREMITY MMT:  MMT Right eval Left eval  Hip flexion 3+* 4  Hip extension    Hip abduction    Hip adduction    Hip internal rotation    Hip external rotation    Knee flexion 4*  5  Knee extension 4-* 5  Ankle dorsiflexion 5 5  Ankle plantarflexion    Ankle inversion    Ankle eversion     (Blank rows = not tested) *= pain/symptoms   FUNCTIONAL TESTS:  5 times sit to stand: 35.69 seconds without UE support; slow, labored, bilateral LE valgus  GAIT: Distance walked: 100 feet Assistive device utilized: Walker - 2 wheeled Level of assistance: CGA Comments: ambulates with RW with out loss of balance DGI - with RW 1. Gait level surface (2)  Mild Impairment: Walks 20', uses assistive devices, slower speed, mild gait deviations. 2. Change in gait speed (2) Mild Impairment: Is able to change speed but demonstrates mild gait deviations, or not gait deviations but unable to achieve a significant change in velocity, or uses an assistive device. 3. Gait with horizontal head turns (2) Mild Impairment: Performs head turns smoothly with slight change in gait velocity, i.e., minor disruption to smooth gait path or uses walking aid. 4. Gait with vertical head turns (2) Mild Impairment: Performs head turns smoothly with slight change in gait velocity, i.e., minor disruption to smooth gait path or uses walking aid. 5. Gait and pivot turn (1) Moderate Impairment: Turns slowly, requires verbal cueing, requires several small steps to catch balance following turn and stop. 6. Step over obstacle (1) Moderate Impairment: Is able to step over box but must stop, then step over. May require verbal cueing. 7. Step around obstacles (1) Moderate Impairment: Is able to clear cones but must significantly slow, speed to accomplish task, or requires verbal cueing. 8. Stairs (2) Mild Impairment: Alternating feet, must use rail.  TOTAL SCORE: 13 / 24   TODAY'S TREATMENT:                                                                                                                              DATE:   09/26/23 Nustep L5 x 5 min RECERT Weighted patient at 111 lbs Sit to stand x 10 with B UE support Attempted hands on knees sit to stand but too hard Standing hip abduction + march x 20 each at barre Weight shifts on airex (side to side & staggered) x 1 min each direction at barre Airex step ups x 8 bilateral  Seated LAQ 2 x 10 bilateral (some discomfort on right hip) Seated adduction ball squeeze x 20 Seated clamshell with yellow loop 2 x 10 Attempted to stand but patient got a little lightheaded Seated knee flexion with slider x 10 bilateral    09/03/23 Nustep L5 x 5 min Weighted patient at 111 lbs Sit to stand x 10 with B UE support Attempted hands on knees sit to stand but too hard Standing hip abduction + march x 20 each at barre Weight shifts on airex (side to side & staggered) x 1 min each direction at barre Airex step ups x 8 bilateral  Seated LAQ 2 x 10 bilateral (some discomfort on right  hip) Seated adduction ball squeeze x 20 Seated clamshell with yellow loop 2 x 10 Attempted to stand but patient got a little lightheaded Seated knee flexion with slider x 10 bilateral    08/24/23 Nustep L1 x 5 min Sit to stand x 10 with B UE support Seated: marching 2x10, Heel/toe raises, LAQ  x 20, ER 1x 10 B, IR x 10 B Seated clam blue loop x 20 (some pain reported in R medial knee) At [facility]: Hip ABD , ext, knee flex x 10 ea B Self Care: walker legs changed out, discussed scheduling and appropriate walker size with caregiver  07/26/23 Eval and education    PATIENT EDUCATION:  Education details: Patient educated on exam findings, POC, scope of PT, HEP, and continued frequent mobility. Person educated: Patient Education method: Explanation, Demonstration, and Handouts Education comprehension: verbalized understanding, returned demonstration, verbal cues required, and tactile cues required  HOME EXERCISE PROGRAM: Access Code: [email or web address] URL: [email or web address] Date: 08/27/2023 Prepared by: Mliss  Exercises - Sit to Stand with Counter Support  - 3 x daily - 3 x weekly - 1 sets - 5-10 reps - Heel Raises with Counter Support  - 1 x daily - 3 x weekly - 2 sets - 10 reps - Seated March  - 1 x daily - 3 x weekly - 2 sets - 10 reps - Seated Hip Internal Rotation AROM  - 1 x daily - 3 x weekly - 2 sets - 10 reps  ASSESSMENT:  CLINICAL IMPRESSION: ***     Patient a 89 y.o. y.o. female who was seen today for physical therapy evaluation and treatment for gait and mobility deficits. Patient presents with pain  limited deficits in bilateral LE strength, ROM, endurance, activity tolerance, gait, balance, and functional mobility with ADL. Patient is having to modify and restrict ADL as indicated by outcome measure score as well as subjective information and objective measures which is affecting overall participation. Patient will benefit from skilled physical therapy in order to improve function and reduce impairment.  OBJECTIVE IMPAIRMENTS: Abnormal gait, decreased activity tolerance, decreased balance, decreased endurance, decreased mobility, difficulty walking, decreased ROM, decreased strength, impaired flexibility, improper body mechanics, and pain.   ACTIVITY LIMITATIONS: carrying, lifting, bending, standing, squatting, stairs, transfers, bathing, hygiene/grooming, locomotion level, and caring for others  PARTICIPATION LIMITATIONS: meal prep, cleaning, laundry, shopping, community activity, and yard work  PERSONAL FACTORS: Age, Fitness, Time since onset of injury/illness/exacerbation, and 3+ comorbidities: HTN, HLD, falls, rib fractures, osteoporosis  are also affecting patient's functional outcome.   REHAB POTENTIAL: Good  CLINICAL DECISION MAKING: Evolving/moderate complexity  EVALUATION COMPLEXITY: Moderate   GOALS: Goals reviewed with patient? Yes  SHORT TERM GOALS: Target date: 08/23/2023    Patient will be independent with HEP in order to improve functional outcomes. Baseline: Goal status: INITIAL  2.  Patient will report at least 25% improvement in symptoms for improved quality of life. Baseline: Goal status: INITIAL   LONG TERM GOALS: Target date: 09/20/2023    Patient will report at least 75% improvement in symptoms for improved quality of life. Baseline:  Goal status: INITIAL  2.  Patient will score at least 17/24 on DGI in order to demonstrate improved balance to reduce the risk for falls at home and in the community.  Baseline:  Goal status: INITIAL  3.  Patient will  be able to complete 5x STS in under 20 seconds in order to reduce the risk of falls. Baseline:  Goal status: INITIAL  4. Patient  will demonstrate grade of 4+/5 MMT grade in all tested musculature as evidence of improved strength to assist with stair ambulation and gait.  Baseline:  Goal status: INITIAL      PLAN:  PT FREQUENCY: 1-2x/week  PT DURATION: 8 weeks  PLANNED INTERVENTIONS: [id number]- PT Re-evaluation, 97110-Therapeutic exercises, 97530- Therapeutic activity, 97112- Neuromuscular re-education, 97535- Self Care, 02859- Manual therapy, [phone]- Gait training, [phone]- Orthotic Fit/training, [phone]- Canalith repositioning, [id number]- Aquatic Therapy, [phone]- Splinting, Patient/Family education, Balance training, Stair training, Taping, Dry Needling, Joint mobilization, Joint manipulation, Spinal manipulation, Spinal mobilization, Scar mobilization, and DME instructions.  PLAN FOR NEXT SESSION: functional strength, balance training, endurance training   Mliss Cummins, PT  09/25/23 4:50 PM

## 2023-09-26 ENCOUNTER — Ambulatory Visit: Admitting: Physical Therapy

## 2023-09-27 DIAGNOSIS — Z0000 Encounter for general adult medical examination without abnormal findings: Secondary | ICD-10-CM | POA: Diagnosis not present

## 2023-09-27 DIAGNOSIS — M419 Scoliosis, unspecified: Secondary | ICD-10-CM | POA: Diagnosis not present

## 2023-09-27 DIAGNOSIS — I10 Essential (primary) hypertension: Secondary | ICD-10-CM | POA: Diagnosis not present

## 2023-09-27 DIAGNOSIS — L89150 Pressure ulcer of sacral region, unstageable: Secondary | ICD-10-CM | POA: Diagnosis not present

## 2023-09-27 DIAGNOSIS — M160 Bilateral primary osteoarthritis of hip: Secondary | ICD-10-CM | POA: Diagnosis not present

## 2023-09-27 DIAGNOSIS — R296 Repeated falls: Secondary | ICD-10-CM | POA: Diagnosis not present

## 2023-09-27 DIAGNOSIS — F331 Major depressive disorder, recurrent, moderate: Secondary | ICD-10-CM | POA: Diagnosis not present

## 2023-09-27 DIAGNOSIS — I700 Atherosclerosis of aorta: Secondary | ICD-10-CM | POA: Diagnosis not present

## 2023-09-28 NOTE — Therapy (Incomplete)
 OUTPATIENT PHYSICAL THERAPY LOWER EXTREMITY TREATMENT AND RE-CERTIFICATION   Patient Name: Susan Marks MRN: [id number] DOB:1926/01/12, 89 y.o., female Today's Date: 09/28/2023  END OF SESSION:      Past Medical History:  Diagnosis Date   Chronic headaches    migraines   Chronic kidney disease    High blood pressure    High cholesterol    Past Surgical History:  Procedure Laterality Date   VESICOVAGINAL FISTULA CLOSURE W/ TAH     Patient Active Problem List   Diagnosis Date Noted   Multiple rib fractures 04/01/2023   Fall at home, initial encounter 04/01/2023   Acute hyponatremia 04/01/2023   HLD (hyperlipidemia) 04/01/2023   Anemia of chronic disease 04/01/2023   Closed flail chest 04/01/2023   LBBB (left bundle branch block) 03/30/2018   Diverticulitis of intestine with perforation without abscess 03/30/2018   Chest pain 03/10/2014   Essential hypertension 03/10/2014   Abnormal EKG 03/10/2014   GERD (gastroesophageal reflux disease) 07/07/2013   Granulomatous lung disease (HCC) 07/07/2013   Cough 06/13/2013    PCP: Verena Mems, MD  REFERRING PROVIDER: Verena Mems, MD  REFERRING DIAG: R26.89 (ICD-10-CM) - Other abnormalities of gait and mobility R29.6 (ICD-10-CM) - Repeated falls  THERAPY DIAG:  No diagnosis found.  Rationale for Evaluation and Treatment: Rehabilitation  ONSET DATE: Jan 2025  SUBJECTIVE:   SUBJECTIVE STATEMENT: ***  syncope?  08/27/23:Patient accompanied by caregiver who submitted a note containing the following information:  Serious short term memory issues, needs repetitive prompting, R leg arthritis, comes on without warning - old injury; periods of weakness throughout the day; tires easily, many prior fallls, not a candidate for cane, does well with walker.   Eval: Patient states bad fall in Jan. 2025 when in the bathroom. Doesn't remember the event, getting memory back. Broke several ribs. Patient states that her and  her husband are in her own home. 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 Using RW at home for mobility. Has caregiver support 24/7.   PERTINENT HISTORY: HTN, HLD, falls, rib fractures, osteoporosis  PAIN:  Are you having pain? Yes: NPRS scale: 1.5/10 Pain location: R hip Pain description: ache Aggravating factors: sitting for long time and then getting up Relieving factors: ice  PRECAUTIONS: Fall  WEIGHT BEARING RESTRICTIONS: No  FALLS:  Has patient fallen in last 6 months? Yes. Number of falls 2   PLOF: ambulates with RW, caregiver support 24/7  PATIENT GOALS: to get stronger  OBJECTIVE: (objective measures from initial evaluation unless otherwise dated)   COGNITION: Overall cognitive status: Within functional limits for tasks assessed     SENSATION: WFL  POSTURE: rounded shoulders, forward head, increased thoracic kyphosis, and flexed trunk    LOWER EXTREMITY ROM:  Active ROM Right eval Left eval  Hip flexion    Hip extension    Hip abduction    Hip adduction    Hip internal rotation    Hip external rotation    Knee flexion    Knee extension    Ankle dorsiflexion    Ankle plantarflexion    Ankle inversion    Ankle eversion     (Blank rows = not tested) *= pain/symptoms  LOWER EXTREMITY MMT:  MMT Right eval Left eval  Hip flexion 3+* 4  Hip extension    Hip abduction    Hip adduction    Hip internal rotation    Hip external rotation    Knee flexion 4*  5  Knee extension 4-* 5  Ankle dorsiflexion 5 5  Ankle plantarflexion    Ankle inversion    Ankle eversion     (Blank rows = not tested) *= pain/symptoms   FUNCTIONAL TESTS:  5 times sit to stand: 35.69 seconds without UE support; slow, labored, bilateral LE valgus  GAIT: Distance walked: 100 feet Assistive device utilized: Walker - 2 wheeled Level of assistance: CGA Comments: ambulates with RW with out loss of balance DGI - with RW 1. Gait level surface (2)  Mild Impairment: Walks 20', uses assistive devices, slower speed, mild gait deviations. 2. Change in gait speed (2) Mild Impairment: Is able to change speed but demonstrates mild gait deviations, or not gait deviations but unable to achieve a significant change in velocity, or uses an assistive device. 3. Gait with horizontal head turns (2) Mild Impairment: Performs head turns smoothly with slight change in gait velocity, i.e., minor disruption to smooth gait path or uses walking aid. 4. Gait with vertical head turns (2) Mild Impairment: Performs head turns smoothly with slight change in gait velocity, i.e., minor disruption to smooth gait path or uses walking aid. 5. Gait and pivot turn (1) Moderate Impairment: Turns slowly, requires verbal cueing, requires several small steps to catch balance following turn and stop. 6. Step over obstacle (1) Moderate Impairment: Is able to step over box but must stop, then step over. May require verbal cueing. 7. Step around obstacles (1) Moderate Impairment: Is able to clear cones but must significantly slow, speed to accomplish task, or requires verbal cueing. 8. Stairs (2) Mild Impairment: Alternating feet, must use rail.  TOTAL SCORE: 13 / 24   TODAY'S TREATMENT:                                                                                                                              DATE:   10/01/23 Nustep L5 x 5 min RECERT Weighted patient at 111 lbs Sit to stand x 10 with B UE support Attempted hands on knees sit to stand but too hard Standing hip abduction + march x 20 each at barre Weight shifts on airex (side to side & staggered) x 1 min each direction at barre Airex step ups x 8 bilateral  Seated LAQ 2 x 10 bilateral (some discomfort on right hip) Seated adduction ball squeeze x 20 Seated clamshell with yellow loop 2 x 10 Attempted to stand but patient got a little lightheaded Seated knee flexion with slider x 10 bilateral    09/03/23 Nustep L5 x 5 min Weighted patient at 111 lbs Sit to stand x 10 with B UE support Attempted hands on knees sit to stand but too hard Standing hip abduction + march x 20 each at barre Weight shifts on airex (side to side & staggered) x 1 min each direction at barre Airex step ups x 8 bilateral  Seated LAQ 2 x 10 bilateral (some discomfort on right  hip) Seated adduction ball squeeze x 20 Seated clamshell with yellow loop 2 x 10 Attempted to stand but patient got a little lightheaded Seated knee flexion with slider x 10 bilateral    08/24/23 Nustep L1 x 5 min Sit to stand x 10 with B UE support Seated: marching 2x10, Heel/toe raises, LAQ  x 20, ER 1x 10 B, IR x 10 B Seated clam blue loop x 20 (some pain reported in R medial knee) At [facility]: Hip ABD , ext, knee flex x 10 ea B Self Care: walker legs changed out, discussed scheduling and appropriate walker size with caregiver  07/26/23 Eval and education    PATIENT EDUCATION:  Education details: Patient educated on exam findings, POC, scope of PT, HEP, and continued frequent mobility. Person educated: Patient Education method: Explanation, Demonstration, and Handouts Education comprehension: verbalized understanding, returned demonstration, verbal cues required, and tactile cues required  HOME EXERCISE PROGRAM: Access Code: [email or web address] URL: [email or web address] Date: 08/27/2023 Prepared by: Mliss  Exercises - Sit to Stand with Counter Support  - 3 x daily - 3 x weekly - 1 sets - 5-10 reps - Heel Raises with Counter Support  - 1 x daily - 3 x weekly - 2 sets - 10 reps - Seated March  - 1 x daily - 3 x weekly - 2 sets - 10 reps - Seated Hip Internal Rotation AROM  - 1 x daily - 3 x weekly - 2 sets - 10 reps  ASSESSMENT:  CLINICAL IMPRESSION: ***     Patient a 89 y.o. y.o. female who was seen today for physical therapy evaluation and treatment for gait and mobility deficits. Patient presents with pain  limited deficits in bilateral LE strength, ROM, endurance, activity tolerance, gait, balance, and functional mobility with ADL. Patient is having to modify and restrict ADL as indicated by outcome measure score as well as subjective information and objective measures which is affecting overall participation. Patient will benefit from skilled physical therapy in order to improve function and reduce impairment.  OBJECTIVE IMPAIRMENTS: Abnormal gait, decreased activity tolerance, decreased balance, decreased endurance, decreased mobility, difficulty walking, decreased ROM, decreased strength, impaired flexibility, improper body mechanics, and pain.   ACTIVITY LIMITATIONS: carrying, lifting, bending, standing, squatting, stairs, transfers, bathing, hygiene/grooming, locomotion level, and caring for others  PARTICIPATION LIMITATIONS: meal prep, cleaning, laundry, shopping, community activity, and yard work  PERSONAL FACTORS: Age, Fitness, Time since onset of injury/illness/exacerbation, and 3+ comorbidities: HTN, HLD, falls, rib fractures, osteoporosis  are also affecting patient's functional outcome.   REHAB POTENTIAL: Good  CLINICAL DECISION MAKING: Evolving/moderate complexity  EVALUATION COMPLEXITY: Moderate   GOALS: Goals reviewed with patient? Yes  SHORT TERM GOALS: Target date: 08/23/2023    Patient will be independent with HEP in order to improve functional outcomes. Baseline: Goal status: INITIAL  2.  Patient will report at least 25% improvement in symptoms for improved quality of life. Baseline: Goal status: INITIAL   LONG TERM GOALS: Target date: 09/20/2023    Patient will report at least 75% improvement in symptoms for improved quality of life. Baseline:  Goal status: INITIAL  2.  Patient will score at least 17/24 on DGI in order to demonstrate improved balance to reduce the risk for falls at home and in the community.  Baseline:  Goal status: INITIAL  3.  Patient will  be able to complete 5x STS in under 20 seconds in order to reduce the risk of falls. Baseline:  Goal status: INITIAL  4. Patient  will demonstrate grade of 4+/5 MMT grade in all tested musculature as evidence of improved strength to assist with stair ambulation and gait.  Baseline:  Goal status: INITIAL      PLAN:  PT FREQUENCY: 1-2x/week  PT DURATION: 8 weeks  PLANNED INTERVENTIONS: [id number]- PT Re-evaluation, 97110-Therapeutic exercises, 97530- Therapeutic activity, 97112- Neuromuscular re-education, 97535- Self Care, [id number]- Manual therapy, [phone]- Gait training, [phone]- Orthotic Fit/training, [phone]- Canalith repositioning, [id number]- Aquatic Therapy, [phone]- Splinting, Patient/Family education, Balance training, Stair training, Taping, Dry Needling, Joint mobilization, Joint manipulation, Spinal manipulation, Spinal mobilization, Scar mobilization, and DME instructions.  PLAN FOR NEXT SESSION: functional strength, balance training, endurance training   Mliss Cummins, PT  09/28/23 1:21 PM

## 2023-10-01 ENCOUNTER — Ambulatory Visit: Attending: Family Medicine | Admitting: Physical Therapy

## 2023-10-01 ENCOUNTER — Telehealth: Payer: Self-pay | Admitting: Physical Therapy

## 2023-10-01 DIAGNOSIS — M6281 Muscle weakness (generalized): Secondary | ICD-10-CM | POA: Insufficient documentation

## 2023-10-01 DIAGNOSIS — R2689 Other abnormalities of gait and mobility: Secondary | ICD-10-CM | POA: Insufficient documentation

## 2023-10-01 DIAGNOSIS — R296 Repeated falls: Secondary | ICD-10-CM | POA: Insufficient documentation

## 2023-10-01 NOTE — Telephone Encounter (Signed)
 Phoned pt regarding missed appt. She states that she hurts more after PT and just doesn't think she can tolerate PT at her age. She would like to discharge.

## 2023-10-08 ENCOUNTER — Encounter: Admitting: Physical Therapy

## 2023-10-15 ENCOUNTER — Encounter: Admitting: Physical Therapy

## 2023-11-21 DIAGNOSIS — R54 Age-related physical debility: Secondary | ICD-10-CM | POA: Diagnosis not present

## 2023-11-21 DIAGNOSIS — M25551 Pain in right hip: Secondary | ICD-10-CM | POA: Diagnosis not present

## 2023-11-21 DIAGNOSIS — L89151 Pressure ulcer of sacral region, stage 1: Secondary | ICD-10-CM | POA: Diagnosis not present

## 2023-11-21 DIAGNOSIS — Z23 Encounter for immunization: Secondary | ICD-10-CM | POA: Diagnosis not present

## 2023-11-21 DIAGNOSIS — G479 Sleep disorder, unspecified: Secondary | ICD-10-CM | POA: Diagnosis not present

## 2023-11-21 DIAGNOSIS — I10 Essential (primary) hypertension: Secondary | ICD-10-CM | POA: Diagnosis not present

## 2023-11-29 DIAGNOSIS — M2141 Flat foot [pes planus] (acquired), right foot: Secondary | ICD-10-CM | POA: Diagnosis not present

## 2023-11-29 DIAGNOSIS — M2142 Flat foot [pes planus] (acquired), left foot: Secondary | ICD-10-CM | POA: Diagnosis not present

## 2023-11-29 DIAGNOSIS — B351 Tinea unguium: Secondary | ICD-10-CM | POA: Diagnosis not present

## 2023-11-29 DIAGNOSIS — M792 Neuralgia and neuritis, unspecified: Secondary | ICD-10-CM | POA: Diagnosis not present

## 2024-01-21 DIAGNOSIS — I10 Essential (primary) hypertension: Secondary | ICD-10-CM | POA: Diagnosis not present

## 2024-01-21 DIAGNOSIS — G3184 Mild cognitive impairment, so stated: Secondary | ICD-10-CM | POA: Diagnosis not present

## 2024-01-21 DIAGNOSIS — K5909 Other constipation: Secondary | ICD-10-CM | POA: Diagnosis not present
